# Patient Record
Sex: Female | Born: 1965 | Race: White | Hispanic: No | Marital: Single | Smoking: Never smoker
Health system: Southern US, Community
[De-identification: ages and names within clinical notes are randomized; demographics above are authoritative.]

## PROBLEM LIST (undated history)

## (undated) DIAGNOSIS — J45909 Unspecified asthma, uncomplicated: Secondary | ICD-10-CM

## (undated) DIAGNOSIS — E119 Type 2 diabetes mellitus without complications: Secondary | ICD-10-CM

## (undated) DIAGNOSIS — C641 Malignant neoplasm of right kidney, except renal pelvis: Secondary | ICD-10-CM

## (undated) DIAGNOSIS — J189 Pneumonia, unspecified organism: Secondary | ICD-10-CM

## (undated) DIAGNOSIS — N189 Chronic kidney disease, unspecified: Secondary | ICD-10-CM

## (undated) DIAGNOSIS — I10 Essential (primary) hypertension: Secondary | ICD-10-CM

## (undated) HISTORY — DX: Essential (primary) hypertension: I10

## (undated) HISTORY — PX: SPINAL FUSION: SHX223

## (undated) HISTORY — DX: Unspecified asthma, uncomplicated: J45.909

## (undated) HISTORY — DX: Type 2 diabetes mellitus without complications: E11.9

## (undated) HISTORY — PX: TONSILLECTOMY: SUR1361

## (undated) HISTORY — PX: EYE SURGERY: SHX253

---

## 2013-11-02 LAB — HM COLONOSCOPY

## 2015-01-23 LAB — HM COLONOSCOPY

## 2019-04-25 LAB — HM DEXA SCAN: HM Dexa Scan: -2.5

## 2019-08-16 ENCOUNTER — Ambulatory Visit: Payer: 59 | Admitting: Family

## 2019-08-16 ENCOUNTER — Other Ambulatory Visit: Payer: Self-pay

## 2019-08-16 ENCOUNTER — Other Ambulatory Visit: Payer: Self-pay | Admitting: Family

## 2019-08-16 ENCOUNTER — Encounter: Payer: Self-pay | Admitting: Family

## 2019-08-16 VITALS — BP 126/80 | HR 88 | Temp 98.0°F | Ht 63.0 in | Wt 141.0 lb

## 2019-08-16 DIAGNOSIS — I10 Essential (primary) hypertension: Secondary | ICD-10-CM | POA: Insufficient documentation

## 2019-08-16 DIAGNOSIS — M810 Age-related osteoporosis without current pathological fracture: Secondary | ICD-10-CM

## 2019-08-16 DIAGNOSIS — M4800 Spinal stenosis, site unspecified: Secondary | ICD-10-CM | POA: Insufficient documentation

## 2019-08-16 DIAGNOSIS — Z981 Arthrodesis status: Secondary | ICD-10-CM | POA: Insufficient documentation

## 2019-08-16 DIAGNOSIS — Z1231 Encounter for screening mammogram for malignant neoplasm of breast: Secondary | ICD-10-CM

## 2019-08-16 MED ORDER — LISINOPRIL-HYDROCHLOROTHIAZIDE 20-12.5 MG PO TABS
0.5000 | ORAL_TABLET | Freq: Every day | ORAL | 3 refills | Status: DC
Start: 2019-08-16 — End: 2020-01-30

## 2019-08-16 NOTE — Progress Notes (Signed)
Joann Bond is a 54 y.o. female with the following history as recorded in EpicCare:  Patient Active Problem List   Diagnosis Date Noted   Essential hypertension 08/16/2019   Spinal stenosis 08/16/2019   H/O spinal fusion 08/16/2019    Current Outpatient Medications  Medication Sig Dispense Refill   cholecalciferol (VITAMIN D3) 25 MCG (1000 UNIT) tablet Take 1,000 Units by mouth daily.     gabapentin (NEURONTIN) 300 MG capsule Take 300 mg by mouth 3 (three) times daily.     lisinopril-hydrochlorothiazide (ZESTORETIC) 20-12.5 MG tablet Take 0.5 tablets by mouth daily. 45 tablet 3   montelukast (SINGULAIR) 10 MG tablet Take 10 mg by mouth daily.     oxyCODONE-acetaminophen (PERCOCET/ROXICET) 5-325 MG tablet Take by mouth 3 (three) times daily.     tizanidine (ZANAFLEX) 2 MG capsule Take 2 mg by mouth 3 (three) times daily.     No current facility-administered medications for this visit.    Allergies: Bactrim [sulfamethoxazole-trimethoprim]  No past medical history on file.  Past Surgical History:  Procedure Laterality Date   SPINAL FUSION     Scoliosis/ age 37   SPINAL FUSION     spinal stenosis age 74    Family History  Family history unknown: Yes    Social History   Tobacco Use   Smoking status: Not on file  Substance Use Topics   Alcohol use: Not on file    Subjective:  Presents today as a new patient; had CPE recently with provider in [place]- does not have any records available.  History of hypertension/ spinal fusion; Diagnosed with osteoporosis from provider in [place]- has not started any medication; does not have copy of DEXA today;  Requesting referral to pain management- wants to see Dr. Francesco Runner in [place].  Due for mammogram but getting 2nd COVID shot next week- knows she needs to wait a few months; Will need a colonoscopy in the next year but does not have any transportation at this time.     Objective:  Vitals:   08/16/19 0913  BP:  126/80  Pulse: 88  Temp: 98 F (36.7 C)  TempSrc: Oral  SpO2: 96%  Weight: 141 lb (64 kg)  Height: 5\' 3"  (1.6 m)    General: Well developed, well nourished, in no acute distress  Skin : Warm and dry.  Head: Normocephalic and atraumatic  Lungs: Respirations unlabored; clear to auscultation bilaterally without wheeze, rales, rhonchi  CVS exam: normal rate and regular rhythm.  Musculoskeletal: No deformities; no active joint inflammation  Extremities: No edema, cyanosis, clubbing  Vessels: Symmetric bilaterally  Neurologic: Alert and oriented; speech intact; face symmetrical; moves all extremities well; CNII-XII intact without focal deficit   Assessment:  1. Essential hypertension   2. Spinal stenosis, unspecified spinal region   3. H/O spinal fusion   4. Screening mammogram, encounter for   5. Osteoporosis, unspecified osteoporosis type, unspecified pathological fracture presence     Plan:  1. Refill is updated; she needs to get copies of most recent CPE/ labs for review in order to determine necessary follow-up; she agrees. 2. & 3. Refer to pain management as patient is requesting; explained that we do not do pain management/ prescribe opioids on 1st visit; she will need to get records from her last pain management provider. 4. Referral updated- plan to get in 3 months; 5. Needs to get copy of most recent DEXA to determine treatment plan;  This visit occurred during the SARS-CoV-2 public health emergency.  Safety protocols were in place, including screening questions prior to the visit, additional usage of staff PPE, and extensive cleaning of exam room while observing appropriate contact time as indicated for disinfecting solutions.     No follow-ups on file.  Orders Placed This Encounter  Procedures   MM Digital Screening    Standing Status:   Future    Standing Expiration Date:   08/15/2020    Scheduling Instructions:     Please schedule out 3 months due to second COVID  shot next week    Order Specific Question:   Reason for Exam (SYMPTOM  OR DIAGNOSIS REQUIRED)    Answer:   screening mammogram    Order Specific Question:   Is the patient pregnant?    Answer:   No    Order Specific Question:   Preferred imaging location?    Answer:   [facility]    Requested Prescriptions   Signed Prescriptions Disp Refills   lisinopril-hydrochlorothiazide (ZESTORETIC) 20-12.5 MG tablet 45 tablet 3    Sig: Take 0.5 tablets by mouth daily.

## 2019-08-17 ENCOUNTER — Telehealth: Payer: Self-pay | Admitting: Family

## 2019-08-17 NOTE — Telephone Encounter (Signed)
New message:   Pt is calling and states that the referral placed on today to pain management needs to be faxed to [phone] per the pt. Please advise.

## 2019-09-06 DIAGNOSIS — G8929 Other chronic pain: Secondary | ICD-10-CM | POA: Diagnosis not present

## 2019-09-06 DIAGNOSIS — M7918 Myalgia, other site: Secondary | ICD-10-CM | POA: Diagnosis not present

## 2019-09-06 DIAGNOSIS — M41115 Juvenile idiopathic scoliosis, thoracolumbar region: Secondary | ICD-10-CM | POA: Diagnosis not present

## 2019-09-06 DIAGNOSIS — G894 Chronic pain syndrome: Secondary | ICD-10-CM | POA: Diagnosis not present

## 2019-09-06 DIAGNOSIS — M5136 Other intervertebral disc degeneration, lumbar region: Secondary | ICD-10-CM | POA: Diagnosis not present

## 2019-09-06 DIAGNOSIS — M47814 Spondylosis without myelopathy or radiculopathy, thoracic region: Secondary | ICD-10-CM | POA: Diagnosis not present

## 2019-09-06 DIAGNOSIS — M47816 Spondylosis without myelopathy or radiculopathy, lumbar region: Secondary | ICD-10-CM | POA: Diagnosis not present

## 2019-09-06 DIAGNOSIS — Z79891 Long term (current) use of opiate analgesic: Secondary | ICD-10-CM | POA: Diagnosis not present

## 2019-09-06 MED FILL — tiZANidine HCL 2 MG TABS: 2 | 30 days supply | Qty: 30 | Fill #0

## 2019-09-06 MED FILL — OXYCODONE-APAP 5-325MG: 5-325 | 5 days supply | Qty: 15 | Fill #0

## 2019-09-12 MED FILL — OXYCODONE-APAP 5-325MG: 5-325 | 30 days supply | Qty: 90 | Fill #0

## 2019-09-22 ENCOUNTER — Ambulatory Visit
Admission: EM | Admit: 2019-09-22 | Discharge: 2019-09-22 | Disposition: A | Discharge status: Home / Self Care | Payer: 59 | Attending: Physician Assistant | Admitting: Physician Assistant

## 2019-09-22 ENCOUNTER — Ambulatory Visit (HOSPITAL_COMMUNITY)
Admission: EM | Admit: 2019-09-22 | Discharge: 2019-09-22 | Disposition: A | Discharge status: Home / Self Care | Payer: 59

## 2019-09-22 ENCOUNTER — Other Ambulatory Visit: Payer: Self-pay

## 2019-09-22 DIAGNOSIS — Z5189 Encounter for other specified aftercare: Secondary | ICD-10-CM

## 2019-09-22 DIAGNOSIS — S60561A Insect bite (nonvenomous) of right hand, initial encounter: Secondary | ICD-10-CM

## 2019-09-22 MED ORDER — TRIAMCINOLONE ACETONIDE 0.1 % EX CREA
1.0000 | TOPICAL_CREAM | Freq: Two times a day (BID) | CUTANEOUS | 0 refills | Status: DC
Start: 2019-09-22 — End: 2020-01-24

## 2019-09-22 MED FILL — TRIAMCINOLONE 0.1% CREAM: 0.1 | 10 days supply | Qty: 30 | Fill #0

## 2019-09-22 NOTE — ED Triage Notes (Signed)
Pt states she was bitten by an insect approximately 2 weeks ago and has redness and some drainage. Pt has used neosporin and benadryl cream and it has not gone away. Pt is aox4 and ambulatory.

## 2019-09-22 NOTE — Discharge Instructions (Signed)
Triamcinolone cream as needed for itching. Keep area clean and dry. Monitor for redness, warmth, pain.

## 2019-09-22 NOTE — ED Provider Notes (Signed)
[facility]    CSN: [id number] Arrival date & time: 09/22/19  1238      History   Chief Complaint Chief Complaint  Patient presents with   Insect Bite    Right hand, has been approx 2 weeks    HPI Joann Bond is a 54 y.o. female.   54 year old female comes in for wound check after insect bite 2 weeks ago. States area is itching in sensation. However, now with redness and some drainage. Denies pain, spreading erythema, warmth. Denies fever. Neosporin and benadryl cream without relief.      History reviewed. No pertinent past medical history.  Patient Active Problem List   Diagnosis Date Noted   Essential hypertension 08/16/2019   Spinal stenosis 08/16/2019   H/O spinal fusion 08/16/2019    Past Surgical History:  Procedure Laterality Date   SPINAL FUSION     Scoliosis/ age 89   SPINAL FUSION     spinal stenosis age 56    OB History   No obstetric history on file.      Home Medications    Prior to Admission medications   Medication Sig Start Date End Date Taking? Authorizing Provider  cholecalciferol (VITAMIN D3) 25 MCG (1000 UNIT) tablet Take 1,000 Units by mouth daily.    [provider]  gabapentin (NEURONTIN) 300 MG capsule Take 300 mg by mouth 3 (three) times daily.    [provider]  lisinopril-hydrochlorothiazide (ZESTORETIC) 20-12.5 MG tablet Take 0.5 tablets by mouth daily. 08/16/19   Marrian Salvage, FNP  montelukast (SINGULAIR) 10 MG tablet Take 10 mg by mouth daily.    [provider]  oxyCODONE-acetaminophen (PERCOCET/ROXICET) 5-325 MG tablet Take by mouth 3 (three) times daily.    [provider]  tizanidine (ZANAFLEX) 2 MG capsule Take 2 mg by mouth 3 (three) times daily.    [provider]  triamcinolone cream (KENALOG) 0.1 % Apply 1 application topically 2 (two) times daily. 09/22/19   Ok Edwards, PA-C    Family History Family History  Problem Relation Age of Onset    Cancer Mother    Hypertension Mother     Social History Social History   Tobacco Use   Smoking status: Never Smoker   Smokeless tobacco: Never Used  Scientific laboratory technician Use: Never used  Substance Use Topics   Alcohol use: Not Currently   Drug use: Never     Allergies   Bactrim [sulfamethoxazole-trimethoprim]   Review of Systems Review of Systems  Reason unable to perform ROS: See HPI as above.     Physical Exam Triage Vital Signs ED Triage Vitals  Enc Vitals Group     BP 09/22/19 1317 121/81     Pulse Rate 09/22/19 1317 96     Resp 09/22/19 1317 17     Temp 09/22/19 1317 98.4 F (36.9 C)     Temp Source 09/22/19 1317 Oral     SpO2 09/22/19 1317 98 %     Weight --      Height --      Head Circumference --      Peak Flow --      Pain Score 09/22/19 1318 2     Pain Loc --      Pain Edu? --      Excl. in [place]? --    No data found.  Updated Vital Signs BP 121/81 (BP Location: Right Arm)    Pulse 96  Temp 98.4 F (36.9 C) (Oral)    Resp 17    SpO2 98%   Physical Exam Constitutional:      General: She is not in acute distress.    Appearance: Normal appearance. She is well-developed. She is not toxic-appearing or diaphoretic.  HENT:     Head: Normocephalic and atraumatic.  Eyes:     Conjunctiva/sclera: Conjunctivae normal.     Pupils: Pupils are equal, round, and reactive to light.  Pulmonary:     Effort: Pulmonary effort is normal. No respiratory distress.  Musculoskeletal:     Cervical back: Normal range of motion and neck supple.  Skin:    General: Skin is warm and dry.     Comments: Healing wound to the webspace of 1st and 2nd MCP on right hand. Surrounding erythema with some swelling. No active drainage. No pain, warmth  Neurological:     Mental Status: She is alert and oriented to person, place, and time.      UC Treatments / Results  Labs (all labs ordered are listed, but only abnormal results are displayed) Labs Reviewed - No data  to display  EKG   Radiology No results found.  Procedures Procedures (including critical care time)  Medications Ordered in UC Medications - No data to display  Initial Impression / Assessment and Plan / UC Course  I have reviewed the triage vital signs and the nursing notes.  Pertinent labs & imaging results that were available during my care of the patient were reviewed by me and considered in my medical decision making (see chart for details).     Healing wound without signs of bacterial infection. Will provide symptomatic treatment with triamcinolone. Wound care instructions given. Return precautions given.  Final Clinical Impressions(s) / UC Diagnoses   Final diagnoses:  Visit for wound check  Insect bite of right hand, initial encounter   ED Prescriptions    Medication Sig Dispense Auth. Provider   triamcinolone cream (KENALOG) 0.1 % Apply 1 application topically 2 (two) times daily. 30 g Ok Edwards, PA-C     PDMP not reviewed this encounter.   Ok Edwards, PA-C 09/22/19 1405

## 2019-10-03 DIAGNOSIS — G894 Chronic pain syndrome: Secondary | ICD-10-CM | POA: Diagnosis not present

## 2019-10-03 DIAGNOSIS — M41115 Juvenile idiopathic scoliosis, thoracolumbar region: Secondary | ICD-10-CM | POA: Diagnosis not present

## 2019-10-03 DIAGNOSIS — M47816 Spondylosis without myelopathy or radiculopathy, lumbar region: Secondary | ICD-10-CM | POA: Diagnosis not present

## 2019-10-03 DIAGNOSIS — Z79891 Long term (current) use of opiate analgesic: Secondary | ICD-10-CM | POA: Diagnosis not present

## 2019-10-03 DIAGNOSIS — M5136 Other intervertebral disc degeneration, lumbar region: Secondary | ICD-10-CM | POA: Diagnosis not present

## 2019-10-03 DIAGNOSIS — H5213 Myopia, bilateral: Secondary | ICD-10-CM | POA: Diagnosis not present

## 2019-10-03 DIAGNOSIS — M47814 Spondylosis without myelopathy or radiculopathy, thoracic region: Secondary | ICD-10-CM | POA: Diagnosis not present

## 2019-10-03 MED FILL — GABAPENTIN 300 MG CAPSULE: 300 | 90 days supply | Qty: 270 | Fill #0

## 2019-10-03 MED FILL — TIZANIDINE HCL 2 MG CAPS: 2 | 90 days supply | Qty: 270 | Fill #0

## 2019-10-05 MED FILL — MONTELUKAST SOD 10 MG TAB: 10 | 30 days supply | Qty: 30 | Fill #0

## 2019-10-09 ENCOUNTER — Encounter: Payer: Self-pay | Admitting: Nurse Practitioner

## 2019-10-09 ENCOUNTER — Ambulatory Visit (INDEPENDENT_AMBULATORY_CARE_PROVIDER_SITE_OTHER): Payer: 59 | Admitting: Nurse Practitioner

## 2019-10-09 ENCOUNTER — Other Ambulatory Visit: Payer: Self-pay

## 2019-10-09 VITALS — BP 122/82 | Ht 63.0 in | Wt 144.0 lb

## 2019-10-09 DIAGNOSIS — Z01419 Encounter for gynecological examination (general) (routine) without abnormal findings: Secondary | ICD-10-CM

## 2019-10-09 DIAGNOSIS — M810 Age-related osteoporosis without current pathological fracture: Secondary | ICD-10-CM

## 2019-10-09 DIAGNOSIS — R87810 Cervical high risk human papillomavirus (HPV) DNA test positive: Secondary | ICD-10-CM

## 2019-10-09 NOTE — Patient Instructions (Signed)

## 2019-10-09 NOTE — Progress Notes (Signed)
   Joann Bond [birth year] [id number]   History:  54 y.o. G0 presents as new patient to establish care. Moved here from [facility]. Recently established with a PCP. We do not have any records. Reports history of cervical polyp, positive HPV 2-3 years ago, osteoporosis, not on treatment. She plans to have bone density sent here and/or to PCP. Has had multiple back surgeries. Mother diagnosed with breast cancer at age 80, BRCA negative. Normal mammogram history. Not currently sexually active.   Gynecologic History No LMP recorded. Patient is postmenopausal.   Last Pap: 2020. Results were: Normal per patient Last mammogram: 2020. Results were: Normal. Mammogram scheduled for 11/24/2019 Last colonoscopy: 5 years ago. Due for repeat but does not have transportation Last Dexa: 06/2019. Results were: osteoporosis  Past medical history, past surgical history, family history and social history were all reviewed and documented in the EPIC chart.  ROS:  A ROS was performed and pertinent positives and negatives are included.  Exam:  Vitals:   10/09/19 1129  BP: 122/82  Weight: 144 lb (65.3 kg)  Height: _0  (1.6 m)   Body mass index is 25.51 kg/m.  General appearance:  Normal Thyroid:  Symmetrical, normal in size, without palpable masses or nodularity. Respiratory  Auscultation:  Clear without wheezing or rhonchi Cardiovascular  Auscultation:  Regular rate, without rubs, murmurs or gallops  Edema/varicosities:  Not grossly evident Abdominal  Soft,nontender, without masses, guarding or rebound.  Liver/spleen:  No organomegaly noted  Hernia:  None appreciated  Skin  Inspection:  Grossly normal   Breasts: Examined lying and sitting.   Right: Without masses, retractions, discharge or axillary adenopathy.   Left: Without masses, retractions, discharge or axillary adenopathy. Gentitourinary   Inguinal/mons:  Normal without inguinal adenopathy  External genitalia:  Normal  BUS/Urethra/Skene's  glands:  Normal  Vagina:  Normal  Cervix:  Normal  Uterus:  Anteverted, normal in size, shape and contour.  Midline and mobile  Adnexa/parametria:     Rt: Without masses or tenderness.   Lt: Without masses or tenderness.  Anus and perineum: Normal   Assessment/Plan:  54 y.o. G0 for annual exam.   Well female exam with routine gynecological exam - Education provided on SBEs, importance of preventative screenings, current guidelines, high calcium diet, regular exercise, and multivitamin daily. Labs elsewhere.  Cervical high risk HPV (human papillomavirus) test positive - 2-3 years ago per patient, otherwise normal pap history. Pap today.   Age-related osteoporosis without current pathological fracture - we do not have records. Most recent bone density June 2021. She plans to have these records sent. Taking daily vitamin D supplement.  Screening for breast cancer -normal breast exam today.  Mother diagnosed with breast cancer at age 18, BRCA negative.  Schedule for screening mammogram 11/24/2019.  Screening for colon cancer -most recent colonoscopy 5 years ago.  She is due for another but does not have transportation at this time.  Recommend Cologuard until then.  Follow-up in 1 year for annual.     Tamela Gammon [facility], 11:54 AM 10/09/2019

## 2019-10-09 NOTE — Addendum Note (Signed)
Addended by: Lorine Bears on: 10/09/2019 12:28 PM   Modules accepted: Orders

## 2019-10-10 ENCOUNTER — Encounter: Payer: Self-pay | Admitting: Physician Assistant

## 2019-10-11 LAB — PAP IG W/ RFLX HPV ASCU

## 2019-10-12 MED FILL — OXYCODONE-APAP 5-325MG: 5-325 | 30 days supply | Qty: 90 | Fill #0

## 2019-10-25 ENCOUNTER — Encounter: Payer: Self-pay | Admitting: Nurse Practitioner

## 2019-10-25 ENCOUNTER — Other Ambulatory Visit: Payer: Self-pay

## 2019-10-25 ENCOUNTER — Ambulatory Visit: Payer: 59 | Admitting: Nurse Practitioner

## 2019-10-25 VITALS — BP 124/80

## 2019-10-25 DIAGNOSIS — R87615 Unsatisfactory cytologic smear of cervix: Secondary | ICD-10-CM

## 2019-10-25 LAB — HM PAP SMEAR

## 2019-10-25 NOTE — Addendum Note (Signed)
Addended by: Lorine Bears on: 10/25/2019 12:36 PM   Modules accepted: Orders

## 2019-10-25 NOTE — Progress Notes (Signed)
History: 54 year old G0 for repeat Pap due to insufficient cells collected 10/09/2019.  Reports positive HPV 2-3 years ago, otherwise normal pap history.   Exam: Appears well External genitalia normal, Vagina normal, cervix normal but almost flush with vaginal wall.  Assessment:  Encounter for repeat Pap smear due to previous insufficient cervical cells  Plan: Will reach out to patient with results. She is agreeable to plan.

## 2019-10-26 LAB — PAP IG W/ RFLX HPV ASCU

## 2019-11-09 ENCOUNTER — Other Ambulatory Visit (HOSPITAL_COMMUNITY): Payer: Self-pay

## 2019-11-10 ENCOUNTER — Other Ambulatory Visit: Payer: Self-pay | Admitting: Family Medicine

## 2019-11-10 ENCOUNTER — Ambulatory Visit: Payer: Self-pay

## 2019-11-10 ENCOUNTER — Other Ambulatory Visit: Payer: Self-pay

## 2019-11-10 DIAGNOSIS — M25511 Pain in right shoulder: Secondary | ICD-10-CM

## 2019-11-13 MED FILL — OXYCODONE-APAP 5-325MG: 5-325 | 30 days supply | Qty: 90 | Fill #0

## 2019-11-13 MED FILL — LISINOPRIL-HCTZ 20-12.5 MG: 20-12.5 | 90 days supply | Qty: 45 | Fill #0

## 2019-11-13 MED FILL — PREVIDENT 0.2% RINSE: 0.2 | 48 days supply | Qty: 473 | Fill #0

## 2019-11-16 ENCOUNTER — Other Ambulatory Visit (HOSPITAL_COMMUNITY): Payer: Self-pay | Admitting: Anesthesiology

## 2019-11-24 ENCOUNTER — Ambulatory Visit
Admission: RE | Admit: 2019-11-24 | Discharge: 2019-11-24 | Disposition: A | Discharge status: Home / Self Care | Payer: 59 | Source: Ambulatory Visit | Attending: Family | Admitting: Family

## 2019-11-24 ENCOUNTER — Other Ambulatory Visit: Payer: Self-pay

## 2019-11-24 DIAGNOSIS — Z1231 Encounter for screening mammogram for malignant neoplasm of breast: Secondary | ICD-10-CM

## 2019-11-24 LAB — HM MAMMOGRAPHY

## 2019-12-13 ENCOUNTER — Other Ambulatory Visit (HOSPITAL_COMMUNITY): Payer: Self-pay | Admitting: Internal Medicine

## 2019-12-13 MED FILL — OXYCODONE-APAP 5-325MG: 5-325 | 30 days supply | Qty: 90 | Fill #0

## 2019-12-13 MED FILL — SHINGRIX 50 MCG SUS: 50 | 1 days supply | Qty: 1 | Fill #0

## 2019-12-27 ENCOUNTER — Other Ambulatory Visit (HOSPITAL_COMMUNITY): Payer: Self-pay | Admitting: Pain Medicine

## 2019-12-27 DIAGNOSIS — M47814 Spondylosis without myelopathy or radiculopathy, thoracic region: Secondary | ICD-10-CM | POA: Diagnosis not present

## 2019-12-27 DIAGNOSIS — G894 Chronic pain syndrome: Secondary | ICD-10-CM | POA: Diagnosis not present

## 2019-12-27 DIAGNOSIS — Z981 Arthrodesis status: Secondary | ICD-10-CM | POA: Diagnosis not present

## 2019-12-27 DIAGNOSIS — M50321 Other cervical disc degeneration at C4-C5 level: Secondary | ICD-10-CM | POA: Diagnosis not present

## 2019-12-27 DIAGNOSIS — M519 Unspecified thoracic, thoracolumbar and lumbosacral intervertebral disc disorder: Secondary | ICD-10-CM | POA: Diagnosis not present

## 2019-12-27 DIAGNOSIS — M419 Scoliosis, unspecified: Secondary | ICD-10-CM | POA: Diagnosis not present

## 2019-12-27 DIAGNOSIS — M5136 Other intervertebral disc degeneration, lumbar region: Secondary | ICD-10-CM | POA: Diagnosis not present

## 2019-12-27 DIAGNOSIS — M47812 Spondylosis without myelopathy or radiculopathy, cervical region: Secondary | ICD-10-CM | POA: Diagnosis not present

## 2019-12-27 DIAGNOSIS — Z79891 Long term (current) use of opiate analgesic: Secondary | ICD-10-CM | POA: Diagnosis not present

## 2020-01-15 ENCOUNTER — Ambulatory Visit: Payer: 59 | Admitting: Family

## 2020-01-15 MED FILL — OXYCODONE-APAP 5-325MG: 5-325 | 30 days supply | Qty: 90 | Fill #0

## 2020-01-17 ENCOUNTER — Ambulatory Visit: Payer: 59 | Admitting: Family

## 2020-01-24 ENCOUNTER — Encounter: Payer: Self-pay | Admitting: Internal Medicine

## 2020-01-24 ENCOUNTER — Other Ambulatory Visit: Payer: Self-pay

## 2020-01-24 ENCOUNTER — Ambulatory Visit: Payer: 59 | Admitting: Family

## 2020-01-24 ENCOUNTER — Telehealth: Payer: Self-pay | Admitting: *Deleted

## 2020-01-24 ENCOUNTER — Ambulatory Visit: Payer: 59 | Admitting: Internal Medicine

## 2020-01-24 VITALS — BP 136/86 | HR 94 | Temp 98.4°F | Resp 16 | Ht 63.0 in | Wt 142.0 lb

## 2020-01-24 DIAGNOSIS — Z0000 Encounter for general adult medical examination without abnormal findings: Secondary | ICD-10-CM | POA: Diagnosis not present

## 2020-01-24 DIAGNOSIS — Z0001 Encounter for general adult medical examination with abnormal findings: Secondary | ICD-10-CM | POA: Diagnosis not present

## 2020-01-24 DIAGNOSIS — T50905A Adverse effect of unspecified drugs, medicaments and biological substances, initial encounter: Secondary | ICD-10-CM

## 2020-01-24 DIAGNOSIS — I10 Essential (primary) hypertension: Secondary | ICD-10-CM

## 2020-01-24 DIAGNOSIS — D539 Nutritional anemia, unspecified: Secondary | ICD-10-CM | POA: Diagnosis not present

## 2020-01-24 DIAGNOSIS — M8000XD Age-related osteoporosis with current pathological fracture, unspecified site, subsequent encounter for fracture with routine healing: Secondary | ICD-10-CM | POA: Diagnosis not present

## 2020-01-24 DIAGNOSIS — N1831 Chronic kidney disease, stage 3a: Secondary | ICD-10-CM

## 2020-01-24 DIAGNOSIS — M8000XA Age-related osteoporosis with current pathological fracture, unspecified site, initial encounter for fracture: Secondary | ICD-10-CM | POA: Insufficient documentation

## 2020-01-24 LAB — URINALYSIS, ROUTINE W REFLEX MICROSCOPIC
Bilirubin Urine: NEGATIVE
Hgb urine dipstick: NEGATIVE
Ketones, ur: NEGATIVE
Leukocytes,Ua: NEGATIVE
Nitrite: NEGATIVE
RBC / HPF: NONE SEEN (ref 0–?)
Specific Gravity, Urine: 1.025 (ref 1.000–1.030)
Total Protein, Urine: NEGATIVE
Urine Glucose: NEGATIVE
Urobilinogen, UA: 0.2 (ref 0.0–1.0)
pH: 5 (ref 5.0–8.0)

## 2020-01-24 LAB — CBC WITH DIFFERENTIAL/PLATELET
Basophils Absolute: 0 10*3/uL (ref 0.0–0.1)
Basophils Relative: 0.5 % (ref 0.0–3.0)
Eosinophils Absolute: 0.2 10*3/uL (ref 0.0–0.7)
Eosinophils Relative: 2.5 % (ref 0.0–5.0)
HCT: 39.3 % (ref 36.0–46.0)
Hemoglobin: 13 g/dL (ref 12.0–15.0)
Lymphocytes Relative: 27.2 % (ref 12.0–46.0)
Lymphs Abs: 2.1 10*3/uL (ref 0.7–4.0)
MCHC: 33 g/dL (ref 30.0–36.0)
MCV: 87.9 fl (ref 78.0–100.0)
Monocytes Absolute: 0.8 10*3/uL (ref 0.1–1.0)
Monocytes Relative: 9.9 % (ref 3.0–12.0)
Neutro Abs: 4.7 10*3/uL (ref 1.4–7.7)
Neutrophils Relative %: 59.9 % (ref 43.0–77.0)
Platelets: 335 10*3/uL (ref 150.0–400.0)
RBC: 4.48 Mil/uL (ref 3.87–5.11)
RDW: 14.9 % (ref 11.5–15.5)
WBC: 7.8 10*3/uL (ref 4.0–10.5)

## 2020-01-24 LAB — BASIC METABOLIC PANEL
BUN: 47 mg/dL — ABNORMAL HIGH (ref 6–23)
CO2: 31 mEq/L (ref 19–32)
Calcium: 10.8 mg/dL — ABNORMAL HIGH (ref 8.4–10.5)
Chloride: 98 mEq/L (ref 96–112)
Creatinine, Ser: 1.15 mg/dL (ref 0.40–1.20)
GFR: 53.83 mL/min — ABNORMAL LOW (ref >60.00)
Glucose, Bld: 104 mg/dL — ABNORMAL HIGH (ref 70–99)
Potassium: 4.4 mEq/L (ref 3.5–5.1)
Sodium: 136 mEq/L (ref 135–145)

## 2020-01-24 LAB — HEPATIC FUNCTION PANEL
ALT: 18 U/L (ref 0–35)
AST: 17 U/L (ref 0–37)
Albumin: 5.2 g/dL (ref 3.5–5.2)
Alkaline Phosphatase: 76 U/L (ref 39–117)
Bilirubin, Direct: 0.1 mg/dL (ref 0.0–0.3)
Total Bilirubin: 0.6 mg/dL (ref 0.2–1.2)
Total Protein: 7.6 g/dL (ref 6.0–8.3)

## 2020-01-24 LAB — LIPID PANEL
Cholesterol: 223 mg/dL — ABNORMAL HIGH (ref 0–200)
HDL: 55.7 mg/dL (ref >39.00)
NonHDL: 167.01
Total CHOL/HDL Ratio: 4
Triglycerides: 279 mg/dL — ABNORMAL HIGH (ref 0.0–149.0)
VLDL: 55.8 mg/dL — ABNORMAL HIGH (ref 0.0–40.0)

## 2020-01-24 LAB — VITAMIN B12: Vitamin B-12: 292 pg/mL (ref 211–911)

## 2020-01-24 LAB — FERRITIN: Ferritin: 26.2 ng/mL (ref 10.0–291.0)

## 2020-01-24 LAB — VITAMIN D 25 HYDROXY (VIT D DEFICIENCY, FRACTURES): VITD: >120 ng/mL

## 2020-01-24 LAB — LDL CHOLESTEROL, DIRECT: Direct LDL: 137 mg/dL

## 2020-01-24 LAB — FOLATE: Folate: 12.9 ng/mL (ref >5.9)

## 2020-01-24 LAB — TSH: TSH: 1.02 u[IU]/mL (ref 0.35–4.50)

## 2020-01-24 NOTE — Telephone Encounter (Signed)
Ask her to stop all Vit D supplements and to let me know if she has any GI symptoms related to this

## 2020-01-24 NOTE — Telephone Encounter (Signed)
Pt informed of below.  

## 2020-01-24 NOTE — Progress Notes (Unsigned)
Subjective:  Patient ID: Joann Bond, female    DOB: 1965-09-18  Age: 55 y.o. MRN: [id number]  CC: Anemia, Hypertension, and Annual Exam  This visit occurred during the SARS-CoV-2 public health emergency.  Safety protocols were in place, including screening questions prior to the visit, additional usage of staff PPE, and extensive cleaning of exam room while observing appropriate contact time as indicated for disinfecting solutions.   NEW TO ME  HPI Joann Bond presents for a CPX.  She has a history of osteoporosis and wants to consider treatment options.  She had a DEXA scan done elsewhere almost a year ago that showed T score of -2.5.  She tells me she recently broke her finger. She has had multiple lumbar surgeries for the treatment of spinal stenosis.  She is treated with oxycodone, acetaminophen, and tizanidine.  She also has a history of hypertension.  She is currently taking an ACE inhibitor and thiazide diuretic.  She is active and denies any recent episodes of chest pain, shortness of breath, palpitations, edema, or fatigue. She has a hx of anemia.   Outpatient Medications Prior to Visit  Medication Sig Dispense Refill  . gabapentin (NEURONTIN) 300 MG capsule Take 300 mg by mouth 3 (three) times daily.    Marland Kitchen oxyCODONE-acetaminophen (PERCOCET/ROXICET) 5-325 MG tablet Take by mouth 3 (three) times daily.    . tizanidine (ZANAFLEX) 2 MG capsule Take 2 mg by mouth 3 (three) times daily.    Marland Kitchen lisinopril-hydrochlorothiazide (ZESTORETIC) 20-12.5 MG tablet Take 0.5 tablets by mouth daily. 45 tablet 3  . cholecalciferol (VITAMIN D3) 25 MCG (1000 UNIT) tablet Take 1,000 Units by mouth daily.    . montelukast (SINGULAIR) 10 MG tablet Take 10 mg by mouth daily.    Marland Kitchen triamcinolone cream (KENALOG) 0.1 % Apply 1 application topically 2 (two) times daily. 30 g 0   No facility-administered medications prior to visit.    ROS Review of Systems  Constitutional: Negative.  Negative for appetite  change, diaphoresis, fatigue and unexpected weight change.  HENT: Negative.   Eyes: Negative for visual disturbance.  Respiratory: Negative for cough, chest tightness, shortness of breath and wheezing.   Cardiovascular: Negative for chest pain, palpitations and leg swelling.  Gastrointestinal: Negative for abdominal pain, constipation, diarrhea, nausea and vomiting.  Endocrine: Negative.   Genitourinary: Negative.  Negative for decreased urine volume, difficulty urinating, dysuria, hematuria and urgency.  Musculoskeletal: Positive for back pain and gait problem. Negative for arthralgias and myalgias.  Skin: Negative.  Negative for color change and pallor.  Neurological: Negative for dizziness, weakness, light-headedness, numbness and headaches.  Hematological: Negative for adenopathy. Does not bruise/bleed easily.  Psychiatric/Behavioral: Negative.     Objective:  BP 136/86   Pulse 94   Temp 98.4 F (36.9 C) (Oral)   Resp 16   Ht 5\' 3"  (1.6 m)   Wt 142 lb (64.4 kg)   SpO2 95%   BMI 25.15 kg/m   BP Readings from Last 3 Encounters:  01/24/20 136/86  10/25/19 124/80  10/09/19 122/82    Wt Readings from Last 3 Encounters:  01/24/20 142 lb (64.4 kg)  10/09/19 144 lb (65.3 kg)  08/16/19 141 lb (64 kg)    Physical Exam Vitals reviewed.  Constitutional:      Appearance: Normal appearance.  HENT:     Nose: Nose normal.     Mouth/Throat:     Mouth: Mucous membranes are moist.  Eyes:     General: No scleral icterus.  Conjunctiva/sclera: Conjunctivae normal.  Cardiovascular:     Rate and Rhythm: Normal rate and regular rhythm.     Heart sounds: No murmur heard.   Pulmonary:     Effort: Pulmonary effort is normal.     Breath sounds: No stridor. No wheezing, rhonchi or rales.  Abdominal:     General: Abdomen is flat. Bowel sounds are normal. There is no distension.     Palpations: Abdomen is soft. There is no hepatomegaly, splenomegaly or mass.     Tenderness: There  is no abdominal tenderness.  Musculoskeletal:        General: Normal range of motion.     Cervical back: Neck supple.     Right lower leg: No edema.     Left lower leg: No edema.  Lymphadenopathy:     Cervical: No cervical adenopathy.  Skin:    General: Skin is warm and dry.     Coloration: Skin is not pale.  Neurological:     General: No focal deficit present.     Mental Status: She is alert and oriented to person, place, and time.  Psychiatric:        Mood and Affect: Mood normal.        Behavior: Behavior normal.     Lab Results  Component Value Date   WBC 7.8 01/24/2020   HGB 13.0 01/24/2020   HCT 39.3 01/24/2020   PLT 335.0 01/24/2020   GLUCOSE 104 (H) 01/24/2020   CHOL 223 (H) 01/24/2020   TRIG 279.0 (H) 01/24/2020   HDL 55.70 01/24/2020   LDLDIRECT 137.0 01/24/2020   ALT 18 01/24/2020   AST 17 01/24/2020   NA 136 01/24/2020   K 4.4 01/24/2020   CL 98 01/24/2020   CREATININE 1.15 01/24/2020   BUN 47 (H) 01/24/2020   CO2 31 01/24/2020   TSH 1.02 01/24/2020    MM Digital Screening  Result Date: 11/28/2019 CLINICAL DATA:  Screening. EXAM: DIGITAL SCREENING BILATERAL MAMMOGRAM WITH CAD COMPARISON:  Previous exam(s). ACR Breast Density Category c: The breast tissue is heterogeneously dense, which may obscure small masses. FINDINGS: Exam is somewhat limited by difficulty with patient positioning. There are no findings suspicious for malignancy. Images were processed with CAD. IMPRESSION: No mammographic evidence of malignancy. A result letter of this screening mammogram will be mailed directly to the patient. RECOMMENDATION: Screening mammogram in one year. (Code:SM-B-01Y) BI-RADS CATEGORY  1: Negative. Electronically Signed   By: Joann Bond M.D.   On: 11/28/2019 12:49    Assessment & Plan:   Joann Bond was seen today for anemia, hypertension and annual exam.  Diagnoses and all orders for this visit:  Age-related osteoporosis with current pathological fracture  with routine healing, subsequent encounter- I recommend that she treat this with an anabolic agent, will start evenity. -     Basic metabolic panel; Future -     VITAMIN D 25 Hydroxy (Vit-D Deficiency, Fractures); Future -     VITAMIN D 25 Hydroxy (Vit-D Deficiency, Fractures) -     Basic metabolic panel  Primary hypertension- Her blood pressure is well controlled but she has developed a prerenal azotemia with a decline in her renal function and hypercalcemia.  I recommended that she stop taking the ACE inhibitor and thiazide diuretic.  Will try to control her blood pressure with an ARB. -     CBC with Differential/Platelet; Future -     Basic metabolic panel; Future -     TSH; Future -  Urinalysis, Routine w reflex microscopic; Future -     Hepatic function panel; Future -     Hepatic function panel -     Urinalysis, Routine w reflex microscopic -     TSH -     Basic metabolic panel -     CBC with Differential/Platelet -     valsartan (DIOVAN) 80 MG tablet; Take 1 tablet (80 mg total) by mouth 2 (two) times daily.  Deficiency anemia- Her H&H are normal now.  I will screen her for vitamin deficiencies. -     CBC with Differential/Platelet; Future -     Vitamin B12; Future -     Vitamin B1; Future -     Folate; Future -     Ferritin; Future -     Reticulocytes; Future -     Reticulocytes -     Ferritin -     Folate -     Vitamin B1 -     Vitamin B12 -     CBC with Differential/Platelet  Encounter for general adult medical examination with abnormal findings- Exam completed, labs reviewed - Statin therapy is not indicated, vaccines reviewed and updated, cancer screenings are up-to-date, patient education material was given. -     Lipid panel; Future -     Hepatitis C antibody; Future -     HIV Antibody (routine testing w rflx); Future -     HIV Antibody (routine testing w rflx) -     Hepatitis C antibody -     Lipid panel  Essential hypertension  Stage 3a chronic kidney  disease ([place])- Will discontinue the ACE inhibitor and thiazide diuretic.  I recommended an ultrasound to see if there is asymmetry or evidence of obstruction. 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  Will also hold the thiazide diuretic for now.  Other orders -     LDL cholesterol, direct   I have discontinued Laquanda Mangieri's montelukast, cholecalciferol, lisinopril-hydrochlorothiazide, and triamcinolone. I am also having her start on valsartan. Additionally, I am having her maintain her oxyCODONE-acetaminophen, gabapentin, and tizanidine.  Meds ordered this encounter  Medications  . valsartan (DIOVAN) 80 MG tablet    Sig: Take 1 tablet (80 mg total) by mouth 2 (two) times daily.    Dispense:  180 tablet    Refill:  1     Follow-up: Return in about 6 months (around 07/23/2020).  Scarlette Calico, MD

## 2020-01-24 NOTE — Patient Instructions (Signed)
Health Maintenance, Female Adopting a healthy lifestyle and getting preventive care are important in promoting health and wellness. Ask your health care provider about:  The right schedule for you to have regular tests and exams.  Things you can do on your own to prevent diseases and keep yourself healthy. What should I know about diet, weight, and exercise? Eat a healthy diet  Eat a diet that includes plenty of vegetables, fruits, low-fat dairy products, and lean protein.  Do not eat a lot of foods that are high in solid fats, added sugars, or sodium.   Maintain a healthy weight Body mass index (BMI) is used to identify weight problems. It estimates body fat based on height and weight. Your health care provider can help determine your BMI and help you achieve or maintain a healthy weight. Get regular exercise Get regular exercise. This is one of the most important things you can do for your health. Most adults should:  Exercise for at least 150 minutes each week. The exercise should increase your heart rate and make you sweat (moderate-intensity exercise).  Do strengthening exercises at least twice a week. This is in addition to the moderate-intensity exercise.  Spend less time sitting. Even light physical activity can be beneficial. Watch cholesterol and blood lipids Have your blood tested for lipids and cholesterol at 55 years of age, then have this test every 5 years. Have your cholesterol levels checked more often if:  Your lipid or cholesterol levels are high.  You are older than 55 years of age.  You are at high risk for heart disease. What should I know about cancer screening? Depending on your health history and family history, you may need to have cancer screening at various ages. This may include screening for:  Breast cancer.  Cervical cancer.  Colorectal cancer.  Skin cancer.  Lung cancer. What should I know about heart disease, diabetes, and high blood  pressure? Blood pressure and heart disease  High blood pressure causes heart disease and increases the risk of stroke. This is more likely to develop in people who have high blood pressure readings, are of African descent, or are overweight.  Have your blood pressure checked: ? Every 3-5 years if you are 18-39 years of age. ? Every year if you are 40 years old or older. Diabetes Have regular diabetes screenings. This checks your fasting blood sugar level. Have the screening done:  Once every three years after age 40 if you are at a normal weight and have a low risk for diabetes.  More often and at a younger age if you are overweight or have a high risk for diabetes. What should I know about preventing infection? Hepatitis B If you have a higher risk for hepatitis B, you should be screened for this virus. Talk with your health care provider to find out if you are at risk for hepatitis B infection. Hepatitis C Testing is recommended for:  Everyone born from 1945 through 1965.  Anyone with known risk factors for hepatitis C. Sexually transmitted infections (STIs)  Get screened for STIs, including gonorrhea and chlamydia, if: ? You are sexually active and are younger than 55 years of age. ? You are older than 55 years of age and your health care provider tells you that you are at risk for this type of infection. ? Your sexual activity has changed since you were last screened, and you are at increased risk for chlamydia or gonorrhea. Ask your health care provider   if you are at risk.  Ask your health care provider about whether you are at high risk for HIV. Your health care provider may recommend a prescription medicine to help prevent HIV infection. If you choose to take medicine to prevent HIV, you should first get tested for HIV. You should then be tested every 3 months for as long as you are taking the medicine. Pregnancy  If you are about to stop having your period (premenopausal) and  you may become pregnant, seek counseling before you get pregnant.  Take 400 to 800 micrograms (mcg) of folic acid every day if you become pregnant.  Ask for birth control (contraception) if you want to prevent pregnancy. Osteoporosis and menopause Osteoporosis is a disease in which the bones lose minerals and strength with aging. This can result in bone fractures. If you are 65 years old or older, or if you are at risk for osteoporosis and fractures, ask your health care provider if you should:  Be screened for bone loss.  Take a calcium or vitamin D supplement to lower your risk of fractures.  Be given hormone replacement therapy (HRT) to treat symptoms of menopause. Follow these instructions at home: Lifestyle  Do not use any products that contain nicotine or tobacco, such as cigarettes, e-cigarettes, and chewing tobacco. If you need help quitting, ask your health care provider.  Do not use street drugs.  Do not share needles.  Ask your health care provider for help if you need support or information about quitting drugs. Alcohol use  Do not drink alcohol if: ? Your health care provider tells you not to drink. ? You are pregnant, may be pregnant, or are planning to become pregnant.  If you drink alcohol: ? Limit how much you use to 0-1 drink a day. ? Limit intake if you are breastfeeding.  Be aware of how much alcohol is in your drink. In the [place] one drink equals one 12 oz bottle of beer (355 mL), one 5 oz glass of wine (148 mL), or one 1 oz glass of hard liquor (44 mL). General instructions  Schedule regular health, dental, and eye exams.  Stay current with your vaccines.  Tell your health care provider if: ? You often feel depressed. ? You have ever been abused or do not feel safe at home. Summary  Adopting a healthy lifestyle and getting preventive care are important in promoting health and wellness.  Follow your health care provider's instructions about healthy  diet, exercising, and getting tested or screened for diseases.  Follow your health care provider's instructions on monitoring your cholesterol and blood pressure. This information is not intended to replace advice given to you by your health care provider. Make sure you discuss any questions you have with your health care provider. Document Revised: 12/15/2017 Document Reviewed: 12/15/2017 Elsevier Patient Education  2021 Elsevier Inc.  

## 2020-01-24 NOTE — Telephone Encounter (Signed)
Per [facility] lab- Patient's Vit D is elevated at > 120.

## 2020-01-27 LAB — HEPATITIS C ANTIBODY
Hepatitis C Ab: NONREACTIVE
SIGNAL TO CUT-OFF: 0.01 (ref <1.00)

## 2020-01-27 LAB — VITAMIN B1: Vitamin B1 (Thiamine): 25 nmol/L (ref 8–30)

## 2020-01-27 LAB — RETICULOCYTES
ABS Retic: 57330 cells/uL (ref 20000–8000)
Retic Ct Pct: 1.3 %

## 2020-01-27 LAB — HIV ANTIBODY (ROUTINE TESTING W REFLEX): HIV 1&2 Ab, 4th Generation: NONREACTIVE

## 2020-01-29 DIAGNOSIS — T50905A Adverse effect of unspecified drugs, medicaments and biological substances, initial encounter: Secondary | ICD-10-CM | POA: Insufficient documentation

## 2020-01-30 ENCOUNTER — Other Ambulatory Visit: Payer: Self-pay | Admitting: Internal Medicine

## 2020-01-30 MED ORDER — VALSARTAN 80 MG PO TABS: 80.0000 mg | ORAL_TABLET | Freq: Two times a day (BID) | ORAL | 1 refills | Status: DC

## 2020-01-30 MED FILL — VALSARTAN 80 MG TABLET: 80 | 90 days supply | Qty: 180 | Fill #0

## 2020-01-31 ENCOUNTER — Telehealth: Payer: Self-pay | Admitting: Internal Medicine

## 2020-01-31 DIAGNOSIS — M810 Age-related osteoporosis without current pathological fracture: Secondary | ICD-10-CM

## 2020-01-31 NOTE — Telephone Encounter (Signed)
1.26.22 paperwork submitted to [employer] for new evenity start.

## 2020-01-31 NOTE — Telephone Encounter (Signed)
-----   Message from Janith Lima, MD sent at 01/27/2020  3:56 PM EST ----- Regarding: Evenity CORRECTION - please set her up for monthly evenity injections  TJ

## 2020-02-01 ENCOUNTER — Ambulatory Visit
Admission: RE | Admit: 2020-02-01 | Discharge: 2020-02-01 | Disposition: A | Discharge status: Home / Self Care | Payer: 59 | Source: Ambulatory Visit | Attending: Internal Medicine | Admitting: Internal Medicine

## 2020-02-01 DIAGNOSIS — N189 Chronic kidney disease, unspecified: Secondary | ICD-10-CM | POA: Diagnosis not present

## 2020-02-01 DIAGNOSIS — N1831 Chronic kidney disease, stage 3a: Secondary | ICD-10-CM

## 2020-02-13 ENCOUNTER — Other Ambulatory Visit: Payer: Self-pay | Admitting: Internal Medicine

## 2020-02-13 DIAGNOSIS — M8000XD Age-related osteoporosis with current pathological fracture, unspecified site, subsequent encounter for fracture with routine healing: Secondary | ICD-10-CM

## 2020-02-13 MED ORDER — EVENITY 105 MG/1.17ML ~~LOC~~ SOSY
210.0000 mg | PREFILLED_SYRINGE | SUBCUTANEOUS | 11 refills | Status: DC
Start: 2020-02-13 — End: 2020-02-19

## 2020-02-13 NOTE — Telephone Encounter (Signed)
Please send an escript for Evenity to the [facility].

## 2020-02-13 NOTE — Telephone Encounter (Signed)
PA Submitted through Cover my meds  2.8.22 Key: [id number] PA Case ID: [id number]

## 2020-02-15 MED FILL — OXYCODONE-APAP 5-325MG: 5-325 | 30 days supply | Qty: 90 | Fill #0

## 2020-02-19 ENCOUNTER — Other Ambulatory Visit: Payer: Self-pay

## 2020-02-19 ENCOUNTER — Ambulatory Visit (HOSPITAL_BASED_OUTPATIENT_CLINIC_OR_DEPARTMENT_OTHER): Payer: 59 | Admitting: Pharmacist

## 2020-02-19 ENCOUNTER — Other Ambulatory Visit: Payer: Self-pay | Admitting: Internal Medicine

## 2020-02-19 DIAGNOSIS — M8000XD Age-related osteoporosis with current pathological fracture, unspecified site, subsequent encounter for fracture with routine healing: Secondary | ICD-10-CM

## 2020-02-19 DIAGNOSIS — Z79899 Other long term (current) drug therapy: Secondary | ICD-10-CM

## 2020-02-19 MED ORDER — EVENITY 105 MG/1.17ML ~~LOC~~ SOSY: 210.0000 mg | PREFILLED_SYRINGE | SUBCUTANEOUS | 11 refills | Status: DC

## 2020-02-19 NOTE — Progress Notes (Signed)
   S: Patient presents today for review of their specialty medication.   Patient is currently taking Evenity for osteoporosis. Patient is managed by Dr. Ronnald Ramp for this.   Dosing: 105 mg qmonth  Adherence: has not yet started   Efficacy: has not yet started. Recommended to complete 12 months of tc  Monitoring:  -S/Sx of injection site rxn: none; counseling given -S/Sx of MACE: none; counseling given  -S/Sx of fracture/ONJ: none; counseling given -Serum calcium: monitored/managed by her specialist -BMD: baseline completed in 2021. Consider reassessment at 6-12 months.   Current adverse effects: - None; has not yet started - GI upset: counseling provided - Arthralgia: counseling provided - HA: counseling provided  O:   Lab Results  Component Value Date   WBC 7.8 01/24/2020   HGB 13.0 01/24/2020   HCT 39.3 01/24/2020   MCV 87.9 01/24/2020   PLT 335.0 01/24/2020      Chemistry      Component Value Date/Time   NA 136 01/24/2020 1114   K 4.4 01/24/2020 1114   CL 98 01/24/2020 1114   CO2 31 01/24/2020 1114   BUN 47 (H) 01/24/2020 1114   CREATININE 1.15 01/24/2020 1114      Component Value Date/Time   CALCIUM 10.8 (H) 01/24/2020 1114   ALKPHOS 76 01/24/2020 1114   AST 17 01/24/2020 1114   ALT 18 01/24/2020 1114   BILITOT 0.6 01/24/2020 1114       A/P: 1. Medication review: patient currently on Evenity for osteoporosis. Reviewed the medication including the following: romosozumab inhibits sclerostin, regulating bone growth. Romosozumab increases bone formation and to a lesser extent, decreases bone resorption. It is a monthly dose consisting of 2 consecutive subQ injections. These should be allowed to sit at room temperature for at least 30 minutes prior to administration. The injections should be given in the abdomen, thigh, or outer area of the upper arm by a health care professional. Sites should be rotated and injections should not be given in broken or damaged  skin. The solution should appear clear to light yellow and should be discarded if cloudy or discolored. Possible adverse effects include injection site reaction, joint pain, HA, peripheral edema. More serious but rare reactions include MACE events, bone fracture, hypocalcemia, and/or ONJ. It does carry a boxed warning for increase risk of MI, stroke and CV death and should be avoided in patients with MACE events within the previous year. All other questions and concerns were addressed with the patient. No recommendations for any changes at this time.   Benard Halsted, PharmD, [facility], [place] [phone]

## 2020-02-21 MED FILL — EVENITY 105 MG/1.17ML SOSY: 105 | 30 days supply | Qty: 2 | Fill #0

## 2020-02-21 MED FILL — SHINGRIX 50 MCG SUS: 50 | 1 days supply | Qty: 1 | Fill #0

## 2020-02-26 ENCOUNTER — Other Ambulatory Visit: Payer: Self-pay

## 2020-02-26 ENCOUNTER — Ambulatory Visit: Payer: 59

## 2020-02-26 DIAGNOSIS — M810 Age-related osteoporosis without current pathological fracture: Secondary | ICD-10-CM

## 2020-02-26 MED ORDER — ROMOSOZUMAB-AQQG 105 MG/1.17ML ~~LOC~~ SOSY
210.0000 mg | PREFILLED_SYRINGE | Freq: Once | SUBCUTANEOUS | Status: DC
Start: 2020-02-26 — End: 2020-03-25

## 2020-02-26 NOTE — Progress Notes (Addendum)
Evenity given.  Please cosign.  Medical screening examination/treatment/procedure(s) were performed by non-physician practitioner and as supervising physician I was immediately available for consultation/collaboration.  I agree with above. Lew Dawes, MD

## 2020-02-28 MED FILL — SHINGRIX 50 MCG SUS: 50 | 1 days supply | Qty: 1 | Fill #1

## 2020-02-28 MED FILL — GABAPENTIN 300 MG CAPSULE: 300 | 90 days supply | Qty: 270 | Fill #0

## 2020-02-28 MED FILL — tiZANidine HCL 2 MG CAPS: 2 | 90 days supply | Qty: 270 | Fill #0

## 2020-03-14 ENCOUNTER — Other Ambulatory Visit (HOSPITAL_COMMUNITY): Payer: Self-pay | Admitting: Pain Medicine

## 2020-03-14 MED FILL — OXYCODONE-APAP 5-325MG: 5-325 | 10 days supply | Qty: 30 | Fill #0

## 2020-03-18 ENCOUNTER — Encounter: Payer: Self-pay | Admitting: Internal Medicine

## 2020-03-20 NOTE — Telephone Encounter (Signed)
Patient spoke with pharmacy they are bringing the evenity on 03.18.22 and she has an appointment on 03.21.22

## 2020-03-21 ENCOUNTER — Other Ambulatory Visit (HOSPITAL_COMMUNITY): Payer: Self-pay | Admitting: Nurse Practitioner

## 2020-03-21 DIAGNOSIS — G894 Chronic pain syndrome: Secondary | ICD-10-CM | POA: Diagnosis not present

## 2020-03-21 DIAGNOSIS — M41115 Juvenile idiopathic scoliosis, thoracolumbar region: Secondary | ICD-10-CM | POA: Diagnosis not present

## 2020-03-21 DIAGNOSIS — Z79891 Long term (current) use of opiate analgesic: Secondary | ICD-10-CM | POA: Diagnosis not present

## 2020-03-22 ENCOUNTER — Telehealth: Payer: Self-pay

## 2020-03-25 ENCOUNTER — Ambulatory Visit (INDEPENDENT_AMBULATORY_CARE_PROVIDER_SITE_OTHER): Payer: 59

## 2020-03-25 ENCOUNTER — Other Ambulatory Visit: Payer: Self-pay

## 2020-03-25 DIAGNOSIS — M810 Age-related osteoporosis without current pathological fracture: Secondary | ICD-10-CM

## 2020-03-25 MED ORDER — ROMOSOZUMAB-AQQG 105 MG/1.17ML ~~LOC~~ SOSY
210.0000 mg | PREFILLED_SYRINGE | Freq: Once | SUBCUTANEOUS | Status: AC
  Administered 2020-03-25: 210 mg via SUBCUTANEOUS

## 2020-03-25 MED FILL — OXYCODONE-APAP 5-325MG: 5-325 | 30 days supply | Qty: 90 | Fill #0

## 2020-03-25 NOTE — Progress Notes (Signed)
Pt here for monthly Evenity injection per Dr Ronnald Ramp.  Evenity 210mg  given subcutaneous in right arm and pt tolerated injection well.  Next Evenity injection scheduled for 04/25/20.

## 2020-03-25 NOTE — Telephone Encounter (Signed)
noted 

## 2020-04-02 ENCOUNTER — Other Ambulatory Visit (HOSPITAL_COMMUNITY): Payer: Self-pay

## 2020-04-17 ENCOUNTER — Other Ambulatory Visit (HOSPITAL_COMMUNITY): Payer: Self-pay

## 2020-04-17 MED FILL — Romosozumab-aqqg Inj Soln Prefilled Syringe 105 MG/1.17ML: SUBCUTANEOUS | 30 days supply | Qty: 2.34 | Fill #0 | Status: AC

## 2020-04-22 ENCOUNTER — Other Ambulatory Visit (HOSPITAL_COMMUNITY): Payer: Self-pay

## 2020-04-23 ENCOUNTER — Other Ambulatory Visit (HOSPITAL_COMMUNITY): Payer: Self-pay

## 2020-04-24 ENCOUNTER — Other Ambulatory Visit (HOSPITAL_COMMUNITY): Payer: Self-pay

## 2020-04-24 ENCOUNTER — Telehealth: Payer: Self-pay

## 2020-04-24 NOTE — Telephone Encounter (Signed)
Evenity delivered today and patient notified.

## 2020-04-25 ENCOUNTER — Other Ambulatory Visit: Payer: Self-pay

## 2020-04-25 ENCOUNTER — Ambulatory Visit (INDEPENDENT_AMBULATORY_CARE_PROVIDER_SITE_OTHER): Payer: 59

## 2020-04-25 ENCOUNTER — Other Ambulatory Visit (HOSPITAL_COMMUNITY): Payer: Self-pay

## 2020-04-25 DIAGNOSIS — M810 Age-related osteoporosis without current pathological fracture: Secondary | ICD-10-CM | POA: Diagnosis not present

## 2020-04-25 MED ORDER — ROMOSOZUMAB-AQQG 105 MG/1.17ML ~~LOC~~ SOSY
210.0000 mg | PREFILLED_SYRINGE | Freq: Once | SUBCUTANEOUS | Status: AC
Start: 2020-04-25 — End: 2020-04-25
  Administered 2020-04-25: 210 mg via SUBCUTANEOUS

## 2020-04-25 MED FILL — Oxycodone w/ Acetaminophen Tab 5-325 MG: ORAL | 30 days supply | Qty: 90 | Fill #0 | Status: AC

## 2020-04-25 NOTE — Progress Notes (Signed)
Pt here for monthlyEvenity injection per Dr. Ronnald Ramp  Evenity 210 mg given Sub Q right arm, and pt tolerated injection well.

## 2020-05-02 ENCOUNTER — Other Ambulatory Visit (HOSPITAL_COMMUNITY): Payer: Self-pay

## 2020-05-06 ENCOUNTER — Other Ambulatory Visit (HOSPITAL_COMMUNITY): Payer: Self-pay

## 2020-05-06 MED FILL — Valsartan Tab 80 MG: ORAL | 90 days supply | Qty: 180 | Fill #0 | Status: AC

## 2020-05-09 ENCOUNTER — Other Ambulatory Visit (HOSPITAL_COMMUNITY): Payer: Self-pay

## 2020-05-09 MED ORDER — SODIUM FLUORIDE 0.2 % MT SOLN
OROMUCOSAL | 5 refills | Status: DC
Start: 2020-05-09 — End: 2020-08-15
  Filled 2020-05-09: qty 473, 30d supply, fill #0

## 2020-05-16 ENCOUNTER — Other Ambulatory Visit (HOSPITAL_COMMUNITY): Payer: Self-pay

## 2020-05-16 MED FILL — Romosozumab-aqqg Inj Soln Prefilled Syringe 105 MG/1.17ML: SUBCUTANEOUS | 30 days supply | Qty: 2.34 | Fill #1 | Status: AC

## 2020-05-20 ENCOUNTER — Telehealth: Payer: Self-pay

## 2020-05-20 NOTE — Telephone Encounter (Signed)
RN requesting evenity be rescheduled to [facility] May 25.  Pt also confirms that Evenity is to be delivered from [place].

## 2020-05-23 ENCOUNTER — Other Ambulatory Visit (HOSPITAL_COMMUNITY): Payer: Self-pay

## 2020-05-24 NOTE — Telephone Encounter (Signed)
Evenity delivered 5.20.22 for appointment on 5.25.22. placed in nurse room fridge

## 2020-05-27 ENCOUNTER — Ambulatory Visit: Payer: 59

## 2020-05-27 ENCOUNTER — Other Ambulatory Visit (HOSPITAL_COMMUNITY): Payer: Self-pay

## 2020-05-27 MED FILL — Tizanidine HCl Cap 2 MG (Base Equivalent): ORAL | 90 days supply | Qty: 270 | Fill #0 | Status: AC

## 2020-05-27 MED FILL — Gabapentin Cap 300 MG: ORAL | 90 days supply | Qty: 270 | Fill #0 | Status: AC

## 2020-05-27 MED FILL — Oxycodone w/ Acetaminophen Tab 5-325 MG: ORAL | 30 days supply | Qty: 90 | Fill #0 | Status: AC

## 2020-05-28 ENCOUNTER — Other Ambulatory Visit (HOSPITAL_COMMUNITY): Payer: Self-pay

## 2020-05-29 ENCOUNTER — Ambulatory Visit (INDEPENDENT_AMBULATORY_CARE_PROVIDER_SITE_OTHER): Payer: 59

## 2020-05-29 ENCOUNTER — Other Ambulatory Visit: Payer: Self-pay

## 2020-05-29 DIAGNOSIS — M810 Age-related osteoporosis without current pathological fracture: Secondary | ICD-10-CM

## 2020-05-29 MED ORDER — ROMOSOZUMAB-AQQG 105 MG/1.17ML ~~LOC~~ SOSY
210.0000 mg | PREFILLED_SYRINGE | Freq: Once | SUBCUTANEOUS | Status: AC
  Administered 2020-05-29: 210 mg via SUBCUTANEOUS

## 2020-05-29 NOTE — Progress Notes (Signed)
Evenity given Left arm Sub Q

## 2020-06-18 ENCOUNTER — Other Ambulatory Visit (HOSPITAL_COMMUNITY): Payer: Self-pay

## 2020-06-19 ENCOUNTER — Other Ambulatory Visit (HOSPITAL_COMMUNITY): Payer: Self-pay

## 2020-06-19 DIAGNOSIS — M5136 Other intervertebral disc degeneration, lumbar region: Secondary | ICD-10-CM | POA: Diagnosis not present

## 2020-06-19 DIAGNOSIS — M41115 Juvenile idiopathic scoliosis, thoracolumbar region: Secondary | ICD-10-CM | POA: Diagnosis not present

## 2020-06-19 DIAGNOSIS — M47814 Spondylosis without myelopathy or radiculopathy, thoracic region: Secondary | ICD-10-CM | POA: Diagnosis not present

## 2020-06-19 MED ORDER — TIZANIDINE HCL 2 MG PO CAPS: 2.0000 mg | ORAL_CAPSULE | Freq: Three times a day (TID) | ORAL | 2 refills | Status: DC

## 2020-06-19 MED ORDER — OXYCODONE-ACETAMINOPHEN 5-325 MG PO TABS
1.0000 | ORAL_TABLET | Freq: Three times a day (TID) | ORAL | 0 refills | Status: DC | PRN
  Filled 2020-07-26: qty 90, 30d supply, fill #0

## 2020-06-19 MED ORDER — OXYCODONE-ACETAMINOPHEN 5-325 MG PO TABS
1.0000 | ORAL_TABLET | Freq: Three times a day (TID) | ORAL | 0 refills | Status: DC | PRN
  Filled 2020-08-27: qty 90, 30d supply, fill #0

## 2020-06-19 MED ORDER — OXYCODONE-ACETAMINOPHEN 5-325 MG PO TABS
1.0000 | ORAL_TABLET | Freq: Three times a day (TID) | ORAL | 0 refills | Status: DC | PRN
  Filled 2020-06-26: qty 90, 30d supply, fill #0

## 2020-06-19 MED ORDER — GABAPENTIN 300 MG PO CAPS
300.0000 mg | ORAL_CAPSULE | Freq: Three times a day (TID) | ORAL | 0 refills | Status: DC
  Filled 2020-09-26: qty 270, 90d supply, fill #0

## 2020-06-19 MED FILL — Romosozumab-aqqg Inj Soln Prefilled Syringe 105 MG/1.17ML: SUBCUTANEOUS | 30 days supply | Qty: 2.34 | Fill #2 | Status: AC

## 2020-06-26 ENCOUNTER — Other Ambulatory Visit (HOSPITAL_COMMUNITY): Payer: Self-pay

## 2020-06-28 ENCOUNTER — Other Ambulatory Visit (HOSPITAL_COMMUNITY): Payer: Self-pay

## 2020-07-01 ENCOUNTER — Telehealth: Payer: Self-pay

## 2020-07-01 NOTE — Telephone Encounter (Signed)
Evenity shot has been received by [facility] from [facility].   Injection has been placed in the nurse room fridge.

## 2020-07-03 ENCOUNTER — Other Ambulatory Visit: Payer: Self-pay

## 2020-07-03 ENCOUNTER — Ambulatory Visit (INDEPENDENT_AMBULATORY_CARE_PROVIDER_SITE_OTHER): Payer: 59

## 2020-07-03 DIAGNOSIS — M810 Age-related osteoporosis without current pathological fracture: Secondary | ICD-10-CM

## 2020-07-03 MED ORDER — ROMOSOZUMAB-AQQG 105 MG/1.17ML ~~LOC~~ SOSY
210.0000 mg | PREFILLED_SYRINGE | SUBCUTANEOUS | Status: DC
  Administered 2020-07-03 – 2020-09-02 (×3): 210 mg via SUBCUTANEOUS

## 2020-07-03 NOTE — Progress Notes (Signed)
Pt here for monthly Evenity injection per Dr Hilary Hertz 105mg  given subcutaneously right arm and pt tolerated injection well.  Next Evenity injection scheduled for 08/02/20.

## 2020-07-10 ENCOUNTER — Ambulatory Visit (HOSPITAL_COMMUNITY)
Admission: EM | Admit: 2020-07-10 | Discharge: 2020-07-10 | Disposition: A | Discharge status: Home / Self Care | Payer: 59 | Attending: Student | Admitting: Student

## 2020-07-10 ENCOUNTER — Other Ambulatory Visit: Payer: Self-pay

## 2020-07-10 ENCOUNTER — Encounter (HOSPITAL_COMMUNITY): Payer: Self-pay | Admitting: *Deleted

## 2020-07-10 DIAGNOSIS — R238 Other skin changes: Secondary | ICD-10-CM

## 2020-07-10 DIAGNOSIS — I10 Essential (primary) hypertension: Secondary | ICD-10-CM | POA: Diagnosis not present

## 2020-07-10 MED ORDER — TRIAMCINOLONE ACETONIDE 0.5 % EX OINT
1.0000 "application " | TOPICAL_OINTMENT | Freq: Two times a day (BID) | CUTANEOUS | 0 refills | Status: AC
  Filled 2020-07-10: qty 30, 15d supply, fill #0

## 2020-07-10 NOTE — ED Provider Notes (Signed)
[place]    CSN: [id number] Arrival date & time: 07/10/20  1635      History   Chief Complaint Chief Complaint  Patient presents with   Blister    HPI Joann Bond is a 55 y.o. female presenting with blister, she states this started from an insect bite.  Medical history hypertension, CKD, spinal stenosis.  States she spent time outside and got mosquito bites on her bilateral LEs from this.  States these started as itchy, but she is now concerned that she has a blister on the back of her left ankle.  Denies fever/chills, discharge from the wounds.  Feeling well otherwise. For hypertension, she does take antihypertensives, but she cannot recall what these are.  States she took her morning dose, but has not taken her evening dose yet.  Denies chest pain, dizziness, shortness of breath, weakness, vision changes, headaches.  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   Age-related osteoporosis with current pathological fracture 01/24/2020   Primary hypertension 01/24/2020   Deficiency anemia 01/24/2020   Encounter for general adult medical examination with abnormal findings 01/24/2020   Stage 3a chronic kidney disease ([place]) 01/24/2020   Essential hypertension 08/16/2019   Spinal stenosis 08/16/2019   H/O spinal fusion 08/16/2019    Past Surgical History:  Procedure Laterality Date   EYE SURGERY     SPINAL FUSION     Scoliosis/ age 43   SPINAL FUSION     spinal stenosis age 19   TONSILLECTOMY      OB History     Gravida  0   Para  0   Term  0   Preterm  0   AB  0   Living  0      SAB  0   IAB  0   Ectopic  0   Multiple  0   Live Births  0            Home Medications    Prior to Admission medications   Medication Sig Start Date End Date Taking? Authorizing Provider  triamcinolone ointment (KENALOG) 0.5 %  Apply 1 application topically 2 (two) times daily for 7 days. 07/10/20 07/17/20 Yes Hazel Sams, PA-C  gabapentin (NEURONTIN) 300 MG capsule Take 300 mg by mouth 3 (three) times daily.    [provider]  gabapentin (NEURONTIN) 300 MG capsule TAKE 1 CAPSULE BY MOUTH THREE TIMES DAILY. 11/16/19 11/15/20  Dorene Ar, MD  gabapentin (NEURONTIN) 300 MG capsule Take 1 capsule (300 mg total) by mouth 3 (three) times daily. 06/19/20     oxyCODONE-acetaminophen (PERCOCET/ROXICET) 5-325 MG tablet Take by mouth 3 (three) times daily.    [provider]  oxyCODONE-acetaminophen (PERCOCET/ROXICET) 5-325 MG tablet TAKE 1 TABLET BY MOUTH EVERY 8 HOURS AS NEEDED FOR PAIN FOR UP TO 30 DAYS. DUE 05/18/20. 03/21/20 09/17/20  Lonell Face, NP  oxyCODONE-acetaminophen (PERCOCET/ROXICET) 5-325 MG tablet TAKE 1 TABLET BY MOUTH EVERY 8 HOURS AS NEEDED FOR UP TO 30 DAYS. DUE 04/21/20. 03/21/20 09/17/20  Lonell Face, NP  oxyCODONE-acetaminophen (PERCOCET/ROXICET) 5-325 MG tablet TAKE 1 TABLET BY MOUTH EVERY 8 HOURS AS NEEDED FOR PAIN FOR UP TO 30 DAYS. DUE 03/23/20. 03/21/20 09/17/20  Lonell Face, NP  oxyCODONE-acetaminophen (PERCOCET/ROXICET) 5-325 MG tablet TAKE ONE TABLET BY MOUTH EVERY 8 (EIGHT) HOURS  AS NEEDED FOR PAIN FOR UP TO 10 DAYS. 03/14/20 09/10/20  Celedonio Miyamoto, MD  oxyCODONE-acetaminophen (PERCOCET/ROXICET) 5-325 MG tablet Take 1 tablet by mouth every 8 (eight) hours as needed for pain for up to 30 days. 06/19/20     oxyCODONE-acetaminophen (PERCOCET/ROXICET) 5-325 MG tablet Take 1 tablet by mouth every 8 (eight) hours as needed for pain for up to 30 days. 07/19/20     oxyCODONE-acetaminophen (PERCOCET/ROXICET) 5-325 MG tablet Take 1 tablet by mouth every 8 (eight) hours as needed for pain for up to 30 days. 08/18/20     Romosozumab-aqqg (EVENITY) 105 MG/1.17ML SOSY injection INJECT 210 MG INTO THE SKIN EVERY 30 DAYS. 02/19/20 02/18/21  Tresa Garter, MD  SODIUM FLUORIDE, DENTAL RINSE,  0.2 % SOLN USE 10 MLS AND SWISH FOR 30 SECONDS AND EXPECTORATE AS DIRECTED NIGHTLY, PREFERABLY BEFORE BEDTIME. DO NOT SWALLOW. 11/09/19 11/08/20    SODIUM FLUORIDE, DENTAL RINSE, 0.2 % SOLN USE AS AN ORAL RINSE, AS DIRECTED ON PACKAGE 05/09/20     tizanidine (ZANAFLEX) 2 MG capsule Take 2 mg by mouth 3 (three) times daily.    [provider]  tizanidine (ZANAFLEX) 2 MG capsule TAKE 1 CAPSULE BY MOUTH THREE TIMES DAILY. 11/16/19 11/15/20  Dorene Ar, MD  tizanidine (ZANAFLEX) 2 MG capsule Take 1 capsule (2 mg total) by mouth 3 (three) times daily. 06/19/20     valsartan (DIOVAN) 80 MG tablet TAKE 1 TABLET (80 MG TOTAL) BY MOUTH 2 (TWO) TIMES DAILY. 01/30/20 01/29/21  Janith Lima, MD  Zoster Vaccine Adjuvanted [facility]) injection TO BE ADMINISTERED BY PHARMACIST. 12/13/19 12/12/20  Carlyle Basques, MD    Family History Family History  Problem Relation Age of Onset   Cancer Mother    Hypertension Mother    Breast cancer Mother     Social History Social History   Tobacco Use   Smoking status: Never   Smokeless tobacco: Never  Vaping Use   Vaping Use: Never used  Substance Use Topics   Alcohol use: Not Currently   Drug use: Never     Allergies   Bactrim [sulfamethoxazole-trimethoprim]   Review of Systems Review of Systems  Skin:  Positive for rash.    Physical Exam Triage Vital Signs ED Triage Vitals  Enc Vitals Group     BP 07/10/20 1742 (!) 188/92     Pulse Rate 07/10/20 1742 (!) 105     Resp 07/10/20 1742 18     Temp 07/10/20 1742 99 F (37.2 C)     Temp src --      SpO2 07/10/20 1742 99 %     Weight --      Height --      Head Circumference --      Peak Flow --      Pain Score 07/10/20 1744 2     Pain Loc --      Pain Edu? --      Excl. in [place]? --    No data found.  Updated Vital Signs BP (!) 188/92   Pulse (!) 105   Temp 99 F (37.2 C)   Resp 18   SpO2 99%   Visual Acuity Right Eye Distance:   Left Eye Distance:   Bilateral Distance:     Right Eye Near:   Left Eye Near:    Bilateral Near:     Physical Exam Vitals reviewed.  Constitutional:      General: She is not in acute distress.  Appearance: Normal appearance. She is not ill-appearing or diaphoretic.  HENT:     Head: Normocephalic and atraumatic.  Cardiovascular:     Rate and Rhythm: Normal rate and regular rhythm.     Heart sounds: Normal heart sounds.  Pulmonary:     Effort: Pulmonary effort is normal.     Breath sounds: Normal breath sounds.  Skin:    General: Skin is warm.     Comments: See images below Multiple erythematous macular spots on Les. One bulla posterior L ankle. No warmth. No discharge.  Neurological:     General: No focal deficit present.     Mental Status: She is alert and oriented to person, place, and time.  Psychiatric:        Mood and Affect: Mood normal.        Behavior: Behavior normal.        Thought Content: Thought content normal.        Judgment: Judgment normal.         UC Treatments / Results  Labs (all labs ordered are listed, but only abnormal results are displayed) Labs Reviewed - No data to display  EKG   Radiology No results found.  Procedures Procedures (including critical care time)  Medications Ordered in UC Medications - No data to display  Initial Impression / Assessment and Plan / UC Course  I have reviewed the triage vital signs and the nursing notes.  Pertinent labs & imaging results that were available during my care of the patient were reviewed by me and considered in my medical decision making (see chart for details).     This patient is a very pleasant 55 y.o. year old female presenting with insect bites and bulla. Afebrile, borderline tachy.  For hypertension, continue current regimen, she has not taken her evening dose yet.  Conservative management with triamcinolone and antihistamine.  Close follow-up with primary care. ED return precautions discussed. Patient verbalizes  understanding and agreement.    Final Clinical Impressions(s) / UC Diagnoses   Final diagnoses:  Skin bulla  Essential hypertension     Discharge Instructions      -Apply triamcinolone ointment twice daily, applied to spots on legs.  Do not apply this to the blister, but you can apply it around it and then over top once it pops. -Over-the-counter antihistamine for symptomatic relief, like Benadryl or Zyrtec. -Wash with gentle soap and water twice daily, air dry or pat to dry. -Do not pop the blister, this will pop on its own.  Popping a blister increases the risk of infection. -Follow-up with your primary care if symptoms worsen instead of improve despite treatment. Please check your blood pressure at home or at the pharmacy. If this continues to be >140/90, follow-up with your primary care provider for further blood pressure management/ medication titration. If you develop chest pain, shortness of breath, vision changes, the worst headache of your life- head straight to the ED or call 911.      ED Prescriptions     Medication Sig Dispense Auth. Provider   triamcinolone ointment (KENALOG) 0.5 % Apply 1 application topically 2 (two) times daily for 7 days. 30 g Hazel Sams, PA-C      PDMP not reviewed this encounter.   Hazel Sams, PA-C 07/10/20 1947

## 2020-07-10 NOTE — Discharge Instructions (Addendum)
-  Apply triamcinolone ointment twice daily, applied to spots on legs.  Do not apply this to the blister, but you can apply it around it and then over top once it pops. -Over-the-counter antihistamine for symptomatic relief, like Benadryl or Zyrtec. -Wash with gentle soap and water twice daily, air dry or pat to dry. -Do not pop the blister, this will pop on its own.  Popping a blister increases the risk of infection. -Follow-up with your primary care if symptoms worsen instead of improve despite treatment. Please check your blood pressure at home or at the pharmacy. If this continues to be >140/90, follow-up with your primary care provider for further blood pressure management/ medication titration. If you develop chest pain, shortness of breath, vision changes, the worst headache of your life- head straight to the ED or call 911.

## 2020-07-10 NOTE — ED Triage Notes (Signed)
Pt reports blister started from insect bite

## 2020-07-11 ENCOUNTER — Other Ambulatory Visit (HOSPITAL_COMMUNITY): Payer: Self-pay

## 2020-07-22 ENCOUNTER — Other Ambulatory Visit (HOSPITAL_COMMUNITY): Payer: Self-pay

## 2020-07-22 MED FILL — Romosozumab-aqqg Inj Soln Prefilled Syringe 105 MG/1.17ML: SUBCUTANEOUS | 30 days supply | Qty: 2.34 | Fill #3 | Status: CN

## 2020-07-24 ENCOUNTER — Ambulatory Visit: Payer: 59 | Admitting: Internal Medicine

## 2020-07-26 ENCOUNTER — Other Ambulatory Visit (HOSPITAL_COMMUNITY): Payer: Self-pay

## 2020-07-26 ENCOUNTER — Other Ambulatory Visit: Payer: Self-pay | Admitting: Internal Medicine

## 2020-07-26 DIAGNOSIS — I10 Essential (primary) hypertension: Secondary | ICD-10-CM

## 2020-07-26 MED ORDER — VALSARTAN 80 MG PO TABS
80.0000 mg | ORAL_TABLET | Freq: Two times a day (BID) | ORAL | 0 refills | Status: DC
  Filled 2020-07-26: qty 180, 90d supply, fill #0

## 2020-08-01 ENCOUNTER — Other Ambulatory Visit (HOSPITAL_COMMUNITY): Payer: Self-pay

## 2020-08-01 MED FILL — Romosozumab-aqqg Inj Soln Prefilled Syringe 105 MG/1.17ML: SUBCUTANEOUS | 30 days supply | Qty: 2.34 | Fill #3 | Status: AC

## 2020-08-02 ENCOUNTER — Ambulatory Visit (INDEPENDENT_AMBULATORY_CARE_PROVIDER_SITE_OTHER): Payer: 59

## 2020-08-02 ENCOUNTER — Other Ambulatory Visit: Payer: Self-pay

## 2020-08-02 ENCOUNTER — Other Ambulatory Visit (HOSPITAL_COMMUNITY): Payer: Self-pay

## 2020-08-02 DIAGNOSIS — M8000XD Age-related osteoporosis with current pathological fracture, unspecified site, subsequent encounter for fracture with routine healing: Secondary | ICD-10-CM

## 2020-08-02 DIAGNOSIS — M810 Age-related osteoporosis without current pathological fracture: Secondary | ICD-10-CM | POA: Diagnosis not present

## 2020-08-02 MED ORDER — ROMOSOZUMAB-AQQG 105 MG/1.17ML ~~LOC~~ SOSY: 210.0000 mg | PREFILLED_SYRINGE | Freq: Once | SUBCUTANEOUS | Status: DC

## 2020-08-02 NOTE — Progress Notes (Signed)
Evenity given.   Please co-sign.  

## 2020-08-05 ENCOUNTER — Other Ambulatory Visit (HOSPITAL_COMMUNITY): Payer: Self-pay

## 2020-08-14 ENCOUNTER — Other Ambulatory Visit: Payer: Self-pay

## 2020-08-14 ENCOUNTER — Ambulatory Visit (INDEPENDENT_AMBULATORY_CARE_PROVIDER_SITE_OTHER): Payer: 59

## 2020-08-14 ENCOUNTER — Ambulatory Visit
Admission: EM | Admit: 2020-08-14 | Discharge: 2020-08-14 | Disposition: A | Discharge status: Home / Self Care | Payer: 59 | Attending: Emergency Medicine | Admitting: Emergency Medicine

## 2020-08-14 DIAGNOSIS — I10 Essential (primary) hypertension: Secondary | ICD-10-CM

## 2020-08-14 DIAGNOSIS — M79671 Pain in right foot: Secondary | ICD-10-CM

## 2020-08-14 DIAGNOSIS — M7731 Calcaneal spur, right foot: Secondary | ICD-10-CM | POA: Diagnosis not present

## 2020-08-14 NOTE — ED Triage Notes (Signed)
Pt c/o right foot pain described mostly on the surface. States started approx 1 week ago but denies any specific injuries to the area. States currently 5/10 achy pain that is constant but worse when weight baring. Has tried ibuprofen at home without relief.

## 2020-08-14 NOTE — Discharge Instructions (Addendum)
Check in with Dr. Ronnald Ramp' office today about your high blood pressure. Although you took your valsartan today, your blood pressure here at Urgent Care is very high: 195/116.   There is no fracture in your right foot. Treat your foot pain as a soft tissue injury. Take ibuprofen 400-'600mg'$  every 4-6 hours for the next 2-3 days and try soaking your foot in a warm epsom salt bath 2 times per day to help with pain. If you continue having foot pain despite rest and treatment, please follow up with Dr. Ronnald Ramp.

## 2020-08-14 NOTE — ED Provider Notes (Signed)
[facility]    CSN: [id number] Arrival date & time: 08/14/20  [id number]      History   Chief Complaint Chief Complaint  Patient presents with   right foot pain    HPI Joann Bond is a 55 y.o. female with R foot pain for a week. 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 Pain worse with weight bearing. Describes pain as ache and burning. Denies swelling or numbness. Hx osteoporosis and pathological fracture of finger.   BP 205/120. Pt reports taking valsartan this morning. Does not check BP at home. 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   Age-related osteoporosis with current pathological fracture 01/24/2020   Primary hypertension 01/24/2020   Deficiency anemia 01/24/2020   Encounter for general adult medical examination with abnormal findings 01/24/2020   Stage 3a chronic kidney disease ([place]) 01/24/2020   Essential hypertension 08/16/2019   Spinal stenosis 08/16/2019   H/O spinal fusion 08/16/2019    Past Surgical History:  Procedure Laterality Date   EYE SURGERY     SPINAL FUSION     Scoliosis/ age 53   SPINAL FUSION     spinal stenosis age 19   TONSILLECTOMY      OB History     Gravida  0   Para  0   Term  0   Preterm  0   AB  0   Living  0      SAB  0   IAB  0   Ectopic  0   Multiple  0   Live Births  0            Home Medications    Prior to Admission medications   Medication Sig Start Date End Date Taking? Authorizing Provider  gabapentin (NEURONTIN) 300 MG capsule Take 300 mg by mouth 3 (three) times daily.    [provider]  gabapentin (NEURONTIN) 300 MG capsule TAKE 1 CAPSULE BY MOUTH THREE TIMES DAILY. 11/16/19  11/15/20  Dorene Ar, MD  gabapentin (NEURONTIN) 300 MG capsule Take 1 capsule (300 mg total) by mouth 3 (three) times daily. 06/19/20     oxyCODONE-acetaminophen (PERCOCET/ROXICET) 5-325 MG tablet Take by mouth 3 (three) times daily.    [provider]  oxyCODONE-acetaminophen (PERCOCET/ROXICET) 5-325 MG tablet TAKE 1 TABLET BY MOUTH EVERY 8 HOURS AS NEEDED FOR PAIN FOR UP TO 30 DAYS. DUE 05/18/20. 03/21/20 09/17/20  Lonell Face, NP  oxyCODONE-acetaminophen (PERCOCET/ROXICET) 5-325 MG tablet TAKE 1 TABLET BY MOUTH EVERY 8 HOURS AS NEEDED FOR UP TO 30 DAYS. DUE 04/21/20. 03/21/20 09/17/20  Lonell Face, NP  oxyCODONE-acetaminophen (PERCOCET/ROXICET) 5-325 MG tablet TAKE 1 TABLET BY MOUTH EVERY 8 HOURS AS NEEDED FOR PAIN FOR UP TO 30 DAYS. DUE 03/23/20. 03/21/20 09/17/20  Lonell Face, NP  oxyCODONE-acetaminophen (PERCOCET/ROXICET) 5-325 MG tablet TAKE ONE TABLET BY MOUTH EVERY 8 (EIGHT) HOURS AS NEEDED FOR PAIN FOR UP TO 10 DAYS. 03/14/20 09/10/20  Celedonio Miyamoto, MD  oxyCODONE-acetaminophen (PERCOCET/ROXICET) 5-325 MG tablet Take 1 tablet by mouth every 8 (eight) hours as needed for pain for up to 30 days. 06/19/20     oxyCODONE-acetaminophen (PERCOCET/ROXICET) 5-325 MG tablet  Take 1 tablet by mouth every 8 (eight) hours as needed for pain for up to 30 days. 07/19/20     oxyCODONE-acetaminophen (PERCOCET/ROXICET) 5-325 MG tablet Take 1 tablet by mouth every 8 (eight) hours as needed for pain for up to 30 days. 08/18/20     Romosozumab-aqqg (EVENITY) 105 MG/1.17ML SOSY injection INJECT 210 MG INTO THE SKIN EVERY 30 DAYS. 02/19/20 02/18/21  Tresa Garter, MD  SODIUM FLUORIDE, DENTAL RINSE, 0.2 % SOLN USE 10 MLS AND SWISH FOR 30 SECONDS AND EXPECTORATE AS DIRECTED NIGHTLY, PREFERABLY BEFORE BEDTIME. DO NOT SWALLOW. 11/09/19 11/08/20    SODIUM FLUORIDE, DENTAL RINSE, 0.2 % SOLN USE AS AN ORAL RINSE, AS DIRECTED ON PACKAGE 05/09/20     tizanidine (ZANAFLEX) 2 MG capsule Take 2 mg by mouth 3 (three)  times daily.    [provider]  tizanidine (ZANAFLEX) 2 MG capsule TAKE 1 CAPSULE BY MOUTH THREE TIMES DAILY. 11/16/19 11/15/20  Dorene Ar, MD  tizanidine (ZANAFLEX) 2 MG capsule Take 1 capsule (2 mg total) by mouth 3 (three) times daily. 06/19/20     valsartan (DIOVAN) 80 MG tablet Take 1 tablet (80 mg total) by mouth 2 (two) times daily. 07/26/20   Janith Lima, MD  Zoster Vaccine Adjuvanted [facility]) injection TO BE ADMINISTERED BY PHARMACIST. 12/13/19 12/12/20  Carlyle Basques, MD    Family History Family History  Problem Relation Age of Onset   Cancer Mother    Hypertension Mother    Breast cancer Mother     Social History Social History   Tobacco Use   Smoking status: Never   Smokeless tobacco: Never  Vaping Use   Vaping Use: Never used  Substance Use Topics   Alcohol use: Not Currently   Drug use: Never     Allergies   Bactrim [sulfamethoxazole-trimethoprim]   Review of Systems Review of Systems  Respiratory:  Negative for chest tightness and shortness of breath.   Cardiovascular:  Negative for chest pain and palpitations.  Musculoskeletal:        R foot pain worse with weight bearing  Skin:  Negative for color change, rash and wound.  Neurological:  Negative for weakness, numbness and headaches.    Physical Exam Triage Vital Signs ED Triage Vitals  Enc Vitals Group     BP 08/14/20 1001 (!) 205/120     Pulse Rate 08/14/20 0957 82     Resp 08/14/20 0957 18     Temp 08/14/20 0957 98.1 F (36.7 C)     Temp Source 08/14/20 0957 Oral     SpO2 08/14/20 0957 98 %     Weight --      Height --      Head Circumference --      Peak Flow --      Pain Score 08/14/20 0958 5     Pain Loc --      Pain Edu? --      Excl. in [place]? --    No data found.  Updated Vital Signs BP (!) 195/116 (BP Location: Right Arm)   Pulse 82   Temp 98.1 F (36.7 C) (Oral)   Resp 18   SpO2 97%   Visual Acuity Right Eye Distance:   Left Eye Distance:    Bilateral Distance:    Right Eye Near:   Left Eye Near:    Bilateral Near:     Physical Exam Constitutional:      Appearance: Normal appearance.  Cardiovascular:  Pulses:          Dorsalis pedis pulses are 2+ on the right side and 2+ on the left side.       Posterior tibial pulses are 2+ on the right side and 2+ on the left side.  Musculoskeletal:        General: No swelling, deformity or signs of injury.     Right foot: Normal range of motion and normal capillary refill. Tenderness and bony tenderness present. No swelling or deformity. Normal pulse.     Left foot: Normal.       Feet:     Comments: Pain with palpation mild. Limping gait favors R foot.   Feet:     Right foot:     Skin integrity: Skin integrity normal.     Left foot:     Skin integrity: Skin integrity normal.  Neurological:     Mental Status: She is alert.     UC Treatments / Results  Labs (all labs ordered are listed, but only abnormal results are displayed) Labs Reviewed - No data to display  EKG   Radiology DG Foot Complete Right  Result Date: 08/14/2020 CLINICAL DATA:  Pain with weight-bearing for week EXAM: RIGHT FOOT COMPLETE - 3+ VIEW COMPARISON:  None. FINDINGS: There is no acute fracture or dislocation. Alignment is normal. The Lisfranc and Chopart joints are intact. The joint spaces are preserved. There is mild Achilles enthesopathy and inferior calcaneal spurring. The soft tissues are unremarkable. IMPRESSION: 1. No acute fracture or dislocation. 2. Mild inferior calcaneal spurring and Achilles enthesopathy. Electronically Signed   By: Valetta Mole MD   On: 08/14/2020 10:46    Procedures Procedures (including critical care time)  Medications Ordered in UC Medications - No data to display  Initial Impression / Assessment and Plan / UC Course  I have reviewed the triage vital signs and the nursing notes.  Pertinent labs & imaging results that were available during my care of the patient  were reviewed by me and considered in my medical decision making (see chart for details).  No fracture on xray of R foot. No skin changes. Pain is not in spinal or peripheral nerve distribution, is localized. Recommended rest, ibuprofen, epsom salt soaks.   BP very elevated today at Urgent Care. Pt reports taking medicine this morning. Denies CV symptoms. Instructed to contact PCP's office today for guidance.   Final Clinical Impressions(s) / UC Diagnoses   Final diagnoses:  Uncontrolled hypertension  Right foot pain     Discharge Instructions      Check in with Dr. Ronnald Ramp' office today about your high blood pressure. Although you took your valsartan today, your blood pressure here at Urgent Care is very high: 195/116.   There is no fracture in your right foot. Treat your foot pain as a soft tissue injury. Take ibuprofen 400-'600mg'$  every 4-6 hours for the next 2-3 days and try soaking your foot in a warm epsom salt bath 2 times per day to help with pain. If you continue having foot pain despite rest and treatment, please follow up with Dr. Ronnald Ramp.      ED Prescriptions   None    PDMP not reviewed this encounter.   Carvel Getting, NP 08/14/20 1112

## 2020-08-14 NOTE — Progress Notes (Signed)
Subjective:    Patient ID: Joann Bond, female    DOB: 06/23/1965, 55 y.o.   MRN: [id number]  HPI The patient is here for an acute visit.   Went to UC on 8/10 and BP was 195/116 - she rechecked it at home it was 204/122 and 188/135.  This morning 187/137.  She is taking diovan 80 mg bid.    She has a strong family history of htn - dad, mom, brother.  Her dad died from htn.  She denies any new medications, supplements.  She tries to eat a low-sodium diet.  She denies any steroid injections recently.  She is on Evenity, but has been for several months.  She is not on any hormone replacement.  She had a headache today and has intermittent headaches.  She assume some of her headaches were related to work.  She does not check her blood pressure on a regular basis because her blood pressure has been well controlled.   Medications and allergies reviewed with patient and updated if appropriate.  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     gabapentin (NEURONTIN) 300 MG capsule Take 1 capsule (300 mg total) by mouth 3 (three) times daily. 270 capsule 0   oxyCODONE-acetaminophen (PERCOCET/ROXICET) 5-325 MG tablet TAKE ONE TABLET BY MOUTH EVERY 8 (EIGHT) HOURS AS NEEDED FOR PAIN FOR UP TO 10 DAYS. 30 tablet 0   Romosozumab-aqqg (EVENITY) 105 MG/1.17ML SOSY injection INJECT 210 MG INTO THE SKIN EVERY  30 DAYS. 2.34 mL 11   SODIUM FLUORIDE, DENTAL RINSE, 0.2 % SOLN USE 10 MLS AND SWISH FOR 30 SECONDS AND EXPECTORATE AS DIRECTED NIGHTLY, PREFERABLY BEFORE BEDTIME. DO NOT SWALLOW. 473 mL 1   tizanidine (ZANAFLEX) 2 MG capsule Take 2 mg by mouth 3 (three) times daily.     valsartan (DIOVAN) 80 MG tablet Take 1 tablet (80 mg total) by mouth 2 (two) times daily. 180 tablet 0   Zoster Vaccine Adjuvanted (SHINGRIX) injection TO BE ADMINISTERED BY PHARMACIST. 1 each 1   Current Facility-Administered Medications on File Prior to Visit  Medication Dose Route Frequency Provider Last Rate Last Admin   Romosozumab-aqqg (EVENITY) 105 MG/1.17ML injection 210 mg  210 mg Subcutaneous Q30 days Janith Lima, MD   210 mg at 08/02/20 1055    Past Medical History:  Diagnosis Date   Asthma    Hypertension     Past Surgical History:  Procedure Laterality Date   EYE SURGERY     SPINAL FUSION     Scoliosis/ age 2   SPINAL FUSION     spinal stenosis age 54   TONSILLECTOMY      Social History   Socioeconomic History   Marital status: Single    Spouse name: Not on file   Number of children: Not on file   Years of education: Not on file   Highest education level: Not on file  Occupational History   Not on file  Tobacco Use   Smoking status: Never   Smokeless tobacco: Never  Vaping Use   Vaping Use: Never used  Substance and Sexual Activity   Alcohol use: Not Currently   Drug use: Never   Sexual activity: Not Currently  Other Topics Concern   Not on file  Social History Narrative   Not on file   Social Determinants of Health   Financial Resource Strain: Not on file  Food Insecurity: Not on file  Transportation Needs: Not on file  Physical Activity: Not on file  Stress: Not on file  Social Connections: Not on file    Family History  Problem Relation Age of Onset   Cancer Mother    Hypertension Mother    Breast cancer Mother     Review of Systems  Constitutional:  Negative  for fever.  HENT:         Some allergy symptoms  Eyes:  Negative for visual disturbance.  Respiratory:  Positive for wheezing (from allergies). Negative for cough and shortness of breath.   Cardiovascular:  Negative for chest pain, palpitations and leg swelling.  Neurological:  Positive for headaches. Negative for dizziness and light-headedness.      Objective:   Vitals:   08/15/20 1103  BP: (!) 180/120  Pulse: 85  Temp: 98.5 F (36.9 C)  SpO2: 97%   BP Readings from Last 3 Encounters:  08/15/20 (!) 180/120  08/14/20 (!) 195/116  07/10/20 (!) 188/92   Wt Readings from Last 3 Encounters:  08/15/20 143 lb (64.9 kg)  01/24/20 142 lb (64.4 kg)  10/09/19 144 lb (65.3 kg)   Body mass index is 25.33 kg/m.   Physical Exam    Constitutional: Appears well-developed and well-nourished. No distress.  Head: Normocephalic and atraumatic.  Neck: Neck supple. No tracheal deviation present. No thyromegaly present.  No cervical lymphadenopathy Cardiovascular: Normal rate, regular rhythm and normal heart sounds.  No murmur heard. No carotid bruit .  No edema Pulmonary/Chest: Effort normal and breath sounds normal. No respiratory distress. No has no wheezes. No rales.  Skin: Skin is warm and dry. Not diaphoretic.  Psychiatric: Normal mood and affect. Behavior is normal.       Assessment & Plan:    Hypertension:   Chronic-acutely not controlled No obvious cause for BP being so high As far as I know the Evenity is not likely to cause htn Continue valsartan 80 mg twice daily Start Bystolic 5 mg daily She will start monitoring her BP at home on a daily basis and keep a log Has follow-up with Dr. Ronnald Ramp in 2 weeks She will call sooner if her blood pressure does not improve with the addition of the Bystolic     This visit occurred during the SARS-CoV-2 public health emergency.  Safety protocols were in place, including screening questions prior to the visit, additional usage of  staff PPE, and extensive cleaning of exam room while observing appropriate contact time as indicated for disinfecting solutions.

## 2020-08-15 ENCOUNTER — Other Ambulatory Visit (HOSPITAL_COMMUNITY): Payer: Self-pay

## 2020-08-15 ENCOUNTER — Encounter: Payer: Self-pay | Admitting: Internal Medicine

## 2020-08-15 ENCOUNTER — Ambulatory Visit: Payer: 59 | Admitting: Internal Medicine

## 2020-08-15 VITALS — BP 180/120 | HR 85 | Temp 98.5°F | Ht 63.0 in | Wt 143.0 lb

## 2020-08-15 DIAGNOSIS — I10 Essential (primary) hypertension: Secondary | ICD-10-CM

## 2020-08-15 MED ORDER — NEBIVOLOL HCL 5 MG PO TABS
5.0000 mg | ORAL_TABLET | Freq: Every day | ORAL | 3 refills | Status: DC
  Filled 2020-08-15: qty 30, 30d supply, fill #0
  Filled 2020-09-12: qty 30, 30d supply, fill #1
  Filled 2020-10-10: qty 30, 30d supply, fill #2
  Filled 2020-11-04: qty 30, 30d supply, fill #3

## 2020-08-15 MED FILL — Romosozumab-aqqg Inj Soln Prefilled Syringe 105 MG/1.17ML: SUBCUTANEOUS | 30 days supply | Qty: 2.34 | Fill #4 | Status: AC

## 2020-08-15 NOTE — Patient Instructions (Addendum)
    Medications changes include :   start bystolic 5 mg daily  Your prescription(s) have been submitted to your pharmacy. Please take as directed and contact our office if you believe you are having problem(s) with the medication(s).   Monitor and log your BP.    Follow up with Dr Ronnald Ramp in 2 weeks.

## 2020-08-16 ENCOUNTER — Encounter: Payer: Self-pay | Admitting: Internal Medicine

## 2020-08-17 ENCOUNTER — Other Ambulatory Visit: Payer: Self-pay | Admitting: Internal Medicine

## 2020-08-17 ENCOUNTER — Other Ambulatory Visit (HOSPITAL_COMMUNITY): Payer: Self-pay

## 2020-08-17 DIAGNOSIS — I10 Essential (primary) hypertension: Secondary | ICD-10-CM

## 2020-08-17 MED ORDER — INDAPAMIDE 1.25 MG PO TABS
1.2500 mg | ORAL_TABLET | Freq: Every day | ORAL | 0 refills | Status: DC
  Filled 2020-08-17: qty 90, 90d supply, fill #0

## 2020-08-21 ENCOUNTER — Other Ambulatory Visit: Payer: Self-pay

## 2020-08-21 ENCOUNTER — Ambulatory Visit (INDEPENDENT_AMBULATORY_CARE_PROVIDER_SITE_OTHER): Payer: 59 | Admitting: Internal Medicine

## 2020-08-21 ENCOUNTER — Encounter: Payer: Self-pay | Admitting: Internal Medicine

## 2020-08-21 ENCOUNTER — Ambulatory Visit: Payer: 59

## 2020-08-21 ENCOUNTER — Other Ambulatory Visit (HOSPITAL_COMMUNITY): Payer: Self-pay

## 2020-08-21 VITALS — BP 166/102 | HR 70 | Temp 98.5°F | Ht 63.0 in | Wt 143.0 lb

## 2020-08-21 DIAGNOSIS — I10 Essential (primary) hypertension: Secondary | ICD-10-CM | POA: Diagnosis not present

## 2020-08-21 LAB — CBC WITH DIFFERENTIAL/PLATELET
Basophils Absolute: 0.1 10*3/uL (ref 0.0–0.1)
Basophils Relative: 1.1 % (ref 0.0–3.0)
Eosinophils Absolute: 0.3 10*3/uL (ref 0.0–0.7)
Eosinophils Relative: 4.5 % (ref 0.0–5.0)
HCT: 39.3 % (ref 36.0–46.0)
Hemoglobin: 13.1 g/dL (ref 12.0–15.0)
Lymphocytes Relative: 26.5 % (ref 12.0–46.0)
Lymphs Abs: 1.8 10*3/uL (ref 0.7–4.0)
MCHC: 33.2 g/dL (ref 30.0–36.0)
MCV: 88.7 fl (ref 78.0–100.0)
Monocytes Absolute: 0.5 10*3/uL (ref 0.1–1.0)
Monocytes Relative: 7.1 % (ref 3.0–12.0)
Neutro Abs: 4 10*3/uL (ref 1.4–7.7)
Neutrophils Relative %: 60.8 % (ref 43.0–77.0)
Platelets: 282 10*3/uL (ref 150.0–400.0)
RBC: 4.44 Mil/uL (ref 3.87–5.11)
RDW: 14 % (ref 11.5–15.5)
WBC: 6.7 10*3/uL (ref 4.0–10.5)

## 2020-08-21 LAB — URINALYSIS, ROUTINE W REFLEX MICROSCOPIC
Bilirubin Urine: NEGATIVE
Ketones, ur: NEGATIVE
Leukocytes,Ua: NEGATIVE
Nitrite: NEGATIVE
Specific Gravity, Urine: 1.025 (ref 1.000–1.030)
Total Protein, Urine: NEGATIVE
Urine Glucose: NEGATIVE
Urobilinogen, UA: 0.2 (ref 0.0–1.0)
pH: 5.5 (ref 5.0–8.0)

## 2020-08-21 LAB — BASIC METABOLIC PANEL
BUN: 26 mg/dL — ABNORMAL HIGH (ref 6–23)
CO2: 30 mEq/L (ref 19–32)
Calcium: 9.6 mg/dL (ref 8.4–10.5)
Chloride: 102 mEq/L (ref 96–112)
Creatinine, Ser: 1.06 mg/dL (ref 0.40–1.20)
GFR: 59.12 mL/min — ABNORMAL LOW (ref >60.00)
Glucose, Bld: 105 mg/dL — ABNORMAL HIGH (ref 70–99)
Potassium: 4.3 mEq/L (ref 3.5–5.1)
Sodium: 141 mEq/L (ref 135–145)

## 2020-08-21 LAB — TSH: TSH: 0.98 u[IU]/mL (ref 0.35–5.50)

## 2020-08-21 MED ORDER — AMLODIPINE BESYLATE 5 MG PO TABS
5.0000 mg | ORAL_TABLET | Freq: Every day | ORAL | 0 refills | Status: DC
  Filled 2020-08-21: qty 90, 90d supply, fill #0

## 2020-08-21 NOTE — Progress Notes (Signed)
Subjective:  Patient ID: Joann Bond, female    DOB: [birth year]  Age: 55 y.o. MRN: [id number]  CC: Hypertension  This visit occurred during the SARS-CoV-2 public health emergency.  Safety protocols were in place, including screening questions prior to the visit, additional usage of staff PPE, and extensive cleaning of exam room while observing appropriate contact time as indicated for disinfecting solutions.    HPI Joann Bond presents for f/up -   She has recently developed rather significant hypertension.  She has had a few headaches recently and has been taking Motrin.  She was recently started on nebivolol and valsartan.  She subsequently called and I added indapamide.  She denies blurred vision, chest pain, shortness of breath, abdominal pain, dysuria, hematuria, edema, diaphoresis, dizziness, or lightheadedness.  Outpatient Medications Prior to Visit  Medication Sig Dispense Refill   gabapentin (NEURONTIN) 300 MG capsule Take 300 mg by mouth 3 (three) times daily.     gabapentin (NEURONTIN) 300 MG capsule Take 1 capsule (300 mg total) by mouth 3 (three) times daily. 270 capsule 0   indapamide (LOZOL) 1.25 MG tablet Take 1 tablet (1.25 mg total) by mouth daily. 90 tablet 0   nebivolol (BYSTOLIC) 5 MG tablet Take 1 tablet (5 mg total) by mouth daily. 30 tablet 3   oxyCODONE-acetaminophen (PERCOCET/ROXICET) 5-325 MG tablet TAKE ONE TABLET BY MOUTH EVERY 8 (EIGHT) HOURS AS NEEDED FOR PAIN FOR UP TO 10 DAYS. 30 tablet 0   Romosozumab-aqqg (EVENITY) 105 MG/1.17ML SOSY injection INJECT 210 MG INTO THE SKIN EVERY 30 DAYS. 2.34 mL 11   SODIUM FLUORIDE, DENTAL RINSE, 0.2 % SOLN USE 10 MLS AND SWISH FOR 30 SECONDS AND EXPECTORATE AS DIRECTED NIGHTLY, PREFERABLY BEFORE BEDTIME. DO NOT SWALLOW. 473 mL 1   tizanidine (ZANAFLEX) 2 MG capsule Take 2 mg by mouth 3 (three) times daily.     valsartan (DIOVAN) 80 MG tablet Take 1 tablet (80 mg total) by mouth 2 (two) times daily. 180 tablet 0   Zoster  Vaccine Adjuvanted (SHINGRIX) injection TO BE ADMINISTERED BY PHARMACIST. 1 each 1   Facility-Administered Medications Prior to Visit  Medication Dose Route Frequency Provider Last Rate Last Admin   Romosozumab-aqqg (EVENITY) 105 MG/1.17ML injection 210 mg  210 mg Subcutaneous Q30 days Joann Lima, MD   210 mg at 08/02/20 1055    ROS Review of Systems  Constitutional:  Negative for chills, diaphoresis, fatigue and fever.  HENT: Negative.    Eyes: Negative.   Respiratory:  Negative for cough, chest tightness, shortness of breath and wheezing.   Cardiovascular:  Negative for chest pain, palpitations and leg swelling.  Gastrointestinal:  Negative for abdominal pain, constipation, diarrhea, nausea and vomiting.  Endocrine: Negative.   Genitourinary: Negative.  Negative for difficulty urinating, dysuria and hematuria.  Musculoskeletal:  Positive for arthralgias. Negative for myalgias.  Skin: Negative.   Neurological:  Positive for headaches. Negative for weakness and light-headedness.  Hematological:  Negative for adenopathy. Does not bruise/bleed easily.   Objective:  BP (!) 166/102 (BP Location: Left Arm, Patient Position: Sitting, Cuff Size: Large)   Pulse 70   Temp 98.5 F (36.9 C) (Oral)   Ht '5\' 3"'$  (1.6 m)   Wt 143 lb (64.9 kg)   SpO2 97%   BMI 25.33 kg/m   BP Readings from Last 3 Encounters:  08/21/20 (!) 166/102  08/21/20 (!) 150/100  08/15/20 (!) 180/120    Wt Readings from Last 3 Encounters:  08/21/20 143 lb (64.9 kg)  08/15/20 143 lb (64.9 kg)  01/24/20 142 lb (64.4 kg)    Physical Exam Vitals reviewed.  HENT:     Nose: Nose normal.     Mouth/Throat:     Mouth: Mucous membranes are moist.  Eyes:     Conjunctiva/sclera: Conjunctivae normal.  Cardiovascular:     Rate and Rhythm: Normal rate and regular rhythm.     Heart sounds: No murmur heard.    Comments: EKG- NSR, 63 bpm LAE No LVH or Q waves No old EKG's for comparison Pulmonary:     Breath  sounds: No stridor. No wheezing, rhonchi or rales.  Abdominal:     General: Abdomen is flat.     Palpations: There is no mass.     Tenderness: There is no abdominal tenderness. There is no guarding.     Hernia: No hernia is present.  Musculoskeletal:        General: Normal range of motion.     Cervical back: Neck supple.     Right lower leg: No edema.     Left lower leg: No edema.  Lymphadenopathy:     Cervical: No cervical adenopathy.  Skin:    General: Skin is warm and dry.  Neurological:     General: No focal deficit present.     Mental Status: She is alert.  Psychiatric:        Mood and Affect: Mood normal.        Behavior: Behavior normal.   Lab Results  Component Value Date   WBC 6.7 08/21/2020   HGB 13.1 08/21/2020   HCT 39.3 08/21/2020   PLT 282.0 08/21/2020   GLUCOSE 105 (H) 08/21/2020   CHOL 223 (H) 01/24/2020   TRIG 279.0 (H) 01/24/2020   HDL 55.70 01/24/2020   LDLDIRECT 137.0 01/24/2020   ALT 18 01/24/2020   AST 17 01/24/2020   NA 141 08/21/2020   K 4.3 08/21/2020   CL 102 08/21/2020   CREATININE 1.06 08/21/2020   BUN 26 (H) 08/21/2020   CO2 30 08/21/2020   TSH 0.98 08/21/2020    DG Foot Complete Right  Result Date: 08/14/2020 CLINICAL DATA:  Pain with weight-bearing for week EXAM: RIGHT FOOT COMPLETE - 3+ VIEW COMPARISON:  None. FINDINGS: There is no acute fracture or dislocation. Alignment is normal. The Lisfranc and Chopart joints are intact. The joint spaces are preserved. There is mild Achilles enthesopathy and inferior calcaneal spurring. The soft tissues are unremarkable. IMPRESSION: 1. No acute fracture or dislocation. 2. Mild inferior calcaneal spurring and Achilles enthesopathy. Electronically Signed   By: Valetta Mole MD   On: 08/14/2020 10:46    Assessment & Plan:   Joann Bond was seen today for hypertension.  Diagnoses and all orders for this visit:  Primary hypertension- Her blood pressure remains poorly controlled and is consistent with  malignant hypertension.  I will check labs to screen for secondary causes.  I recommended an MR angio to screen for renal artery stenosis.  Will add amlodipine to her current regimen. -     CBC with Differential/Platelet; Future -     Basic metabolic panel; Future -     Urinalysis, Routine w reflex microscopic; Future -     EKG 12-Lead -     Aldosterone + renin activity w/ ratio; Future -     TSH; Future -     amLODipine (NORVASC) 5 MG tablet; Take 1 tablet (5 mg total) by mouth daily. -     TSH -  Aldosterone + renin activity w/ ratio -     Urinalysis, Routine w reflex microscopic -     Basic metabolic panel -     CBC with Differential/Platelet -     MR ANGIO ABDOMEN W WO CONTRAST; Future  I am having Joann Bond start on amLODipine. I am also having her maintain her gabapentin, tizanidine, oxyCODONE-acetaminophen, Zoster Vaccine Adjuvanted, SODIUM FLUORIDE (DENTAL RINSE), Romosozumab-aqqg, gabapentin, valsartan, nebivolol, and indapamide. We will continue to administer Romosozumab-aqqg.  Meds ordered this encounter  Medications   amLODipine (NORVASC) 5 MG tablet    Sig: Take 1 tablet (5 mg total) by mouth daily.    Dispense:  90 tablet    Refill:  0      Follow-up: Return in about 3 weeks (around 09/11/2020).  Scarlette Calico, MD

## 2020-08-21 NOTE — Progress Notes (Signed)
Pt is here for a BP check before starting BP meds per Dr.Jones.  BP: 160/120 LA  BP: 150/100 RA  *Provider will see pt to day to discuss BP meds and possible change of medication.

## 2020-08-21 NOTE — Patient Instructions (Signed)

## 2020-08-26 ENCOUNTER — Other Ambulatory Visit (HOSPITAL_COMMUNITY): Payer: Self-pay

## 2020-08-26 ENCOUNTER — Telehealth: Payer: Self-pay | Admitting: Internal Medicine

## 2020-08-26 NOTE — Telephone Encounter (Signed)
Patient was seen by Dr. Quay Burow 08.11.22 for HBP  Patient says she was going over med list w/ nurse   Patient says pharmacy advised her that nurse removed oxyCODONE-acetaminophen (PERCOCET/ROXICET) 5-325 MG tablet from med list  Patient says she had 1 more refill left & she doesn't go to see pain dr until sept  Please follow up [phone]

## 2020-08-27 ENCOUNTER — Other Ambulatory Visit (HOSPITAL_COMMUNITY): Payer: Self-pay

## 2020-08-27 LAB — ALDOSTERONE + RENIN ACTIVITY W/ RATIO
Aldosterone: <1 ng/dL
Renin Activity: 0.34 ng/mL/h (ref 0.25–5.82)

## 2020-08-27 NOTE — Telephone Encounter (Signed)
Medication has been added back to list.

## 2020-08-28 ENCOUNTER — Other Ambulatory Visit (HOSPITAL_COMMUNITY): Payer: Self-pay

## 2020-09-01 ENCOUNTER — Other Ambulatory Visit: Payer: Self-pay

## 2020-09-02 ENCOUNTER — Encounter: Payer: Self-pay | Admitting: Internal Medicine

## 2020-09-02 ENCOUNTER — Ambulatory Visit: Payer: 59 | Admitting: Internal Medicine

## 2020-09-02 VITALS — BP 138/78 | HR 73 | Temp 98.2°F | Resp 16 | Ht 63.0 in | Wt 141.0 lb

## 2020-09-02 DIAGNOSIS — M810 Age-related osteoporosis without current pathological fracture: Secondary | ICD-10-CM

## 2020-09-02 DIAGNOSIS — I10 Essential (primary) hypertension: Secondary | ICD-10-CM

## 2020-09-02 MED ORDER — ROMOSOZUMAB-AQQG 105 MG/1.17ML ~~LOC~~ SOSY: 210.0000 mg | PREFILLED_SYRINGE | Freq: Once | SUBCUTANEOUS | Status: DC

## 2020-09-02 NOTE — Progress Notes (Signed)
Subjective:  Patient ID: Joann Bond, female    DOB: 1965/08/09  Age: 55 y.o. MRN: [id number]  CC: Hypertension  This visit occurred during the SARS-CoV-2 public health emergency.  Safety protocols were in place, including screening questions prior to the visit, additional usage of staff PPE, and extensive cleaning of exam room while observing appropriate contact time as indicated for disinfecting solutions.    HPI Joann Bond presents for f/up -  She tells me she is compliant with all 4 antihypertensives.  She has been concerned recently that she has had some systolic blood pressures in the upper 90s and low 100s.  She denies headache, dizziness, lightheadedness, blurred vision, chest pain, shortness of breath, edema, or palpitations.  She is scheduled soon to have the MRA of her renal arteries. She intermittently takes motrin for MSK pain.  Outpatient Medications Prior to Visit  Medication Sig Dispense Refill   amLODipine (NORVASC) 5 MG tablet Take 1 tablet (5 mg total) by mouth daily. 90 tablet 0   gabapentin (NEURONTIN) 300 MG capsule Take 300 mg by mouth 3 (three) times daily.     gabapentin (NEURONTIN) 300 MG capsule Take 1 capsule (300 mg total) by mouth 3 (three) times daily. 270 capsule 0   nebivolol (BYSTOLIC) 5 MG tablet Take 1 tablet (5 mg total) by mouth daily. 30 tablet 3   oxyCODONE-acetaminophen (PERCOCET/ROXICET) 5-325 MG tablet Take 1 tablet by mouth every 8 (eight) hours as needed for pain for up to 30 days. 90 tablet 0   Romosozumab-aqqg (EVENITY) 105 MG/1.17ML SOSY injection INJECT 210 MG INTO THE SKIN EVERY 30 DAYS. 2.34 mL 11   SODIUM FLUORIDE, DENTAL RINSE, 0.2 % SOLN USE 10 MLS AND SWISH FOR 30 SECONDS AND EXPECTORATE AS DIRECTED NIGHTLY, PREFERABLY BEFORE BEDTIME. DO NOT SWALLOW. 473 mL 1   tizanidine (ZANAFLEX) 2 MG capsule Take 2 mg by mouth 3 (three) times daily.     valsartan (DIOVAN) 80 MG tablet Take 1 tablet (80 mg total) by mouth 2 (two) times daily. 180  tablet 0   Zoster Vaccine Adjuvanted (SHINGRIX) injection TO BE ADMINISTERED BY PHARMACIST. 1 each 1   indapamide (LOZOL) 1.25 MG tablet Take 1 tablet (1.25 mg total) by mouth daily. 90 tablet 0   Romosozumab-aqqg (EVENITY) 105 MG/1.17ML injection 210 mg      No facility-administered medications prior to visit.    ROS Review of Systems  Constitutional:  Negative for diaphoresis and fatigue.  HENT: Negative.    Eyes: Negative.   Respiratory:  Negative for chest tightness, shortness of breath and wheezing.   Cardiovascular:  Negative for chest pain, palpitations and leg swelling.  Gastrointestinal:  Negative for abdominal pain, constipation, diarrhea, nausea and vomiting.  Endocrine: Negative.   Genitourinary: Negative.  Negative for difficulty urinating and hematuria.  Musculoskeletal:  Positive for arthralgias. Negative for myalgias.  Skin: Negative.   Neurological:  Negative for dizziness, weakness, light-headedness and headaches.  Hematological:  Negative for adenopathy. Does not bruise/bleed easily.  Psychiatric/Behavioral: Negative.     Objective:  BP 138/78 (BP Location: Left Arm, Patient Position: Sitting, Cuff Size: Large) Comment: BP (R) 136/78 (L) 138/78  Pulse 73   Temp 98.2 F (36.8 C) (Oral)   Resp 16   Ht '5\' 3"'$  (1.6 m)   Wt 141 lb (64 kg)   SpO2 97%   BMI 24.98 kg/m   BP Readings from Last 3 Encounters:  09/02/20 138/78  08/21/20 (!) 166/102  08/21/20 (!) 150/100  Wt Readings from Last 3 Encounters:  09/02/20 141 lb (64 kg)  08/21/20 143 lb (64.9 kg)  08/15/20 143 lb (64.9 kg)    Physical Exam Vitals reviewed.  Constitutional:      Appearance: Normal appearance.  HENT:     Mouth/Throat:     Mouth: Mucous membranes are moist.  Eyes:     General: No scleral icterus.    Conjunctiva/sclera: Conjunctivae normal.  Cardiovascular:     Rate and Rhythm: Normal rate and regular rhythm.     Heart sounds: No murmur heard. Pulmonary:     Effort:  Pulmonary effort is normal.     Breath sounds: No stridor. No wheezing, rhonchi or rales.  Abdominal:     General: Abdomen is flat.     Palpations: There is no mass.     Tenderness: There is no abdominal tenderness. There is no guarding.     Hernia: No hernia is present.  Musculoskeletal:        General: Normal range of motion.     Cervical back: Neck supple.     Right lower leg: No edema.     Left lower leg: No edema.  Lymphadenopathy:     Cervical: No cervical adenopathy.  Skin:    General: Skin is warm.  Neurological:     General: No focal deficit present.     Mental Status: She is alert.  Psychiatric:        Mood and Affect: Mood normal.    Lab Results  Component Value Date   WBC 6.7 08/21/2020   HGB 13.1 08/21/2020   HCT 39.3 08/21/2020   PLT 282.0 08/21/2020   GLUCOSE 105 (H) 08/21/2020   CHOL 223 (H) 01/24/2020   TRIG 279.0 (H) 01/24/2020   HDL 55.70 01/24/2020   LDLDIRECT 137.0 01/24/2020   ALT 18 01/24/2020   AST 17 01/24/2020   NA 141 08/21/2020   K 4.3 08/21/2020   CL 102 08/21/2020   CREATININE 1.06 08/21/2020   BUN 26 (H) 08/21/2020   CO2 30 08/21/2020   TSH 0.98 08/21/2020    DG Foot Complete Right  Result Date: 08/14/2020 CLINICAL DATA:  Pain with weight-bearing for week EXAM: RIGHT FOOT COMPLETE - 3+ VIEW COMPARISON:  None. FINDINGS: There is no acute fracture or dislocation. Alignment is normal. The Lisfranc and Chopart joints are intact. The joint spaces are preserved. There is mild Achilles enthesopathy and inferior calcaneal spurring. The soft tissues are unremarkable. IMPRESSION: 1. No acute fracture or dislocation. 2. Mild inferior calcaneal spurring and Achilles enthesopathy. Electronically Signed   By: Valetta Mole MD   On: 08/14/2020 10:46    Assessment & Plan:   Joann Bond was seen today for hypertension.  Diagnoses and all orders for this visit:  Primary hypertension- Her blood pressure today is adequately well controlled but she reports  some SBPs that were too low.  I recommended that she discontinue indapamide and to continue the other 3 antihypertensives.  Osteoporosis, unspecified osteoporosis type, unspecified pathological fracture presence -     Discontinue: Romosozumab-aqqg (EVENITY) 105 MG/1.17ML injection 210 mg  I have discontinued Joann Bond's indapamide. I am also having her maintain her gabapentin, tizanidine, Zoster Vaccine Adjuvanted, SODIUM FLUORIDE (DENTAL RINSE), Romosozumab-aqqg, gabapentin, oxyCODONE-acetaminophen, valsartan, nebivolol, and amLODipine. We will stop administering Romosozumab-aqqg. Additionally, we administered Romosozumab-aqqg.  Meds ordered this encounter  Medications   DISCONTD: Romosozumab-aqqg (EVENITY) 105 MG/1.17ML injection 210 mg    Restricted to Outpatient Use.  Do Not Tube.  Follow-up: Return in about 3 months (around 12/03/2020).  Scarlette Calico, MD

## 2020-09-02 NOTE — Patient Instructions (Addendum)
Hypertension, Adult High blood pressure (hypertension) is when the force of blood pumping through the arteries is too strong. The arteries are the blood vessels that carry blood from the heart throughout the body. Hypertension forces the heart to work harder to pump blood and may cause arteries to become narrow or stiff. Untreated or uncontrolled hypertension can cause a heart attack, heart failure, a stroke, kidney disease, and other problems. A blood pressure reading consists of a higher number over a lower number. Ideally, your blood pressure should be below 120/80. The first ("top") number is called the systolic pressure. It is a measure of the pressure in your arteries as your heart beats. The second ("bottom") number is called the diastolic pressure. It is a measure of the pressure in your arteries as the heart relaxes. What are the causes? The exact cause of this condition is not known. There are some conditions that result in or are related to high blood pressure. What increases the risk? Some risk factors for high blood pressure are under your control. The following factors may make you more likely to develop this condition: Smoking. Having type 2 diabetes mellitus, high cholesterol, or both. Not getting enough exercise or physical activity. Being overweight. Having too much fat, sugar, calories, or salt (sodium) in your diet. Drinking too much alcohol. Some risk factors for high blood pressure may be difficult or impossible to change. Some of these factors include: Having chronic kidney disease. Having a family history of high blood pressure. Age. Risk increases with age. Race. You may be at higher risk if you are African American. Gender. Men are at higher risk than women before age 45. After age 65, women are at higher risk than men. Having obstructive sleep apnea. Stress. What are the signs or symptoms? High blood pressure may not cause symptoms. Very high blood pressure  (hypertensive crisis) may cause: Headache. Anxiety. Shortness of breath. Nosebleed. Nausea and vomiting. Vision changes. Severe chest pain. Seizures. How is this diagnosed? This condition is diagnosed by measuring your blood pressure while you are seated, with your arm resting on a flat surface, your legs uncrossed, and your feet flat on the floor. The cuff of the blood pressure monitor will be placed directly against the skin of your upper arm at the level of your heart. It should be measured at least twice using the same arm. Certain conditions can cause a difference in blood pressure between your right and left arms. Certain factors can cause blood pressure readings to be lower or higher than normal for a short period of time: When your blood pressure is higher when you are in a health care provider's office than when you are at home, this is called white coat hypertension. Most people with this condition do not need medicines. When your blood pressure is higher at home than when you are in a health care provider's office, this is called masked hypertension. Most people with this condition may need medicines to control blood pressure. If you have a high blood pressure reading during one visit or you have normal blood pressure with other risk factors, you may be asked to: Return on a different day to have your blood pressure checked again. Monitor your blood pressure at home for 1 week or longer. If you are diagnosed with hypertension, you may have other blood or imaging tests to help your health care provider understand your overall risk for other conditions. How is this treated? This condition is treated by making   eating healthy foods, exercising more, and reducing your alcohol intake. Your health care provider may prescribe medicine if lifestyle changes are not enough to get your blood pressure under control, and if: Your systolic blood pressure is above 130. Your  diastolic blood pressure is above 80. Your personal target blood pressure may vary depending on your medicalconditions, your age, and other factors. Follow these instructions at home: Eating and drinking  Eat a diet that is high in fiber and potassium, and low in sodium, added sugar, and fat. An example eating plan is called the DASH (Dietary Approaches to Stop Hypertension) diet. To eat this way: Eat plenty of fresh fruits and vegetables. Try to fill one half of your plate at each meal with fruits and vegetables. Eat whole grains, such as whole-wheat pasta, brown rice, or whole-grain bread. Fill about one fourth of your plate with whole grains. Eat or drink low-fat dairy products, such as skim milk or low-fat yogurt. Avoid fatty cuts of meat, processed or cured meats, and poultry with skin. Fill about one fourth of your plate with lean proteins, such as fish, chicken without skin, beans, eggs, or tofu. Avoid pre-made and processed foods. These tend to be higher in sodium, added sugar, and fat. Reduce your daily sodium intake. Most people with hypertension should eat less than 1,500 mg of sodium a day. Do not drink alcohol if: Your health care provider tells you not to drink. You are pregnant, may be pregnant, or are planning to become pregnant. If you drink alcohol: Limit how much you use to: 0-1 drink a day for women. 0-2 drinks a day for men. Be aware of how much alcohol is in your drink. In the [place] one drink equals one 12 oz bottle of beer (355 mL), one 5 oz glass of wine (148 mL), or one 1 oz glass of hard liquor (44 mL).  Lifestyle  Work with your health care provider to maintain a healthy body weight or to lose weight. Ask what an ideal weight is for you. Get at least 30 minutes of exercise most days of the week. Activities may include walking, swimming, or biking. Include exercise to strengthen your muscles (resistance exercise), such as Pilates or lifting weights, as part of your  weekly exercise routine. Try to do these types of exercises for 30 minutes at least 3 days a week. Do not use any products that contain nicotine or tobacco, such as cigarettes, e-cigarettes, and chewing tobacco. If you need help quitting, ask your health care provider. Monitor your blood pressure at home as told by your health care provider. Keep all follow-up visits as told by your health care provider. This is important.  Medicines Take over-the-counter and prescription medicines only as told by your health care provider. Follow directions carefully. Blood pressure medicines must be taken as prescribed. Do not skip doses of blood pressure medicine. Doing this puts you at risk for problems and can make the medicine less effective. Ask your health care provider about side effects or reactions to medicines that you should watch for. Contact a health care provider if you: Think you are having a reaction to a medicine you are taking. Have headaches that keep coming back (recurring). Feel dizzy. Have swelling in your ankles. Have trouble with your vision. Get help right away if you: Develop a severe headache or confusion. Have unusual weakness or numbness. Feel faint. Have severe pain in your chest or abdomen. Vomit repeatedly. Have trouble breathing. Summary Hypertension is  when the force of blood pumping through your arteries is too strong. If this condition is not controlled, it may put you at risk for serious complications. Your personal target blood pressure may vary depending on your medical conditions, your age, and other factors. For most people, a normal blood pressure is less than 120/80. Hypertension is treated with lifestyle changes, medicines, or a combination of both. Lifestyle changes include losing weight, eating a healthy, low-sodium diet, exercising more, and limiting alcohol. This information is not intended to replace advice given to you by your health care provider. Make  sure you discuss any questions you have with your healthcare provider. Document Revised: 09/01/2017 Document Reviewed: 09/01/2017 Elsevier Patient Education  2022 [place]. Hypertension, Adult High blood pressure (hypertension) is when the force of blood pumping through the arteries is too strong. The arteries are the blood vessels that carry blood from the heart throughout the body. Hypertension forces the heart to work harder to pump blood and may cause arteries to become narrow or stiff. Untreated or uncontrolled hypertension can cause a heart attack, heart failure, a stroke, kidney disease, and otherproblems. A blood pressure reading consists of a higher number over a lower number. Ideally, your blood pressure should be below 120/80. The first ("top") number is called the systolic pressure. It is a measure of the pressure in your arteries as your heart beats. The second ("bottom") number is called the diastolic pressure. It is a measure of the pressure in your arteries as theheart relaxes. What are the causes? The exact cause of this condition is not known. There are some conditions thatresult in or are related to high blood pressure. What increases the risk? Some risk factors for high blood pressure are under your control. The following factors may make you more likely to develop this condition: Smoking. Having type 2 diabetes mellitus, high cholesterol, or both. Not getting enough exercise or physical activity. Being overweight. Having too much fat, sugar, calories, or salt (sodium) in your diet. Drinking too much alcohol. Some risk factors for high blood pressure may be difficult or impossible to change. Some of these factors include: Having chronic kidney disease. Having a family history of high blood pressure. Age. Risk increases with age. Race. You may be at higher risk if you are African American. Gender. Men are at higher risk than women before age 30. After age 57, women are at  higher risk than men. Having obstructive sleep apnea. Stress. What are the signs or symptoms? High blood pressure may not cause symptoms. Very high blood pressure (hypertensive crisis) may cause: Headache. Anxiety. Shortness of breath. Nosebleed. Nausea and vomiting. Vision changes. Severe chest pain. Seizures. How is this diagnosed? This condition is diagnosed by measuring your blood pressure while you are seated, with your arm resting on a flat surface, your legs uncrossed, and your feet flat on the floor. The cuff of the blood pressure monitor will be placed directly against the skin of your upper arm at the level of your heart. It should be measured at least twice using the same arm. Certain conditions cancause a difference in blood pressure between your right and left arms. Certain factors can cause blood pressure readings to be lower or higher than normal for a short period of time: When your blood pressure is higher when you are in a health care provider's office than when you are at home, this is called white coat hypertension. Most people with this condition do not need medicines. When  your blood pressure is higher at home than when you are in a health care provider's office, this is called masked hypertension. Most people with this condition may need medicines to control blood pressure. If you have a high blood pressure reading during one visit or you have normal blood pressure with other risk factors, you may be asked to: Return on a different day to have your blood pressure checked again. Monitor your blood pressure at home for 1 week or longer. If you are diagnosed with hypertension, you may have other blood or imaging tests to help your health care provider understand your overall risk for otherconditions. How is this treated? This condition is treated by making healthy lifestyle changes, such as eating healthy foods, exercising more, and reducing your alcohol intake. Your health  care provider may prescribe medicine if lifestyle changes are not enough to get your blood pressure under control, and if: Your systolic blood pressure is above 130. Your diastolic blood pressure is above 80. Your personal target blood pressure may vary depending on your medicalconditions, your age, and other factors. Follow these instructions at home: Eating and drinking  Eat a diet that is high in fiber and potassium, and low in sodium, added sugar, and fat. An example eating plan is called the DASH (Dietary Approaches to Stop Hypertension) diet. To eat this way: Eat plenty of fresh fruits and vegetables. Try to fill one half of your plate at each meal with fruits and vegetables. Eat whole grains, such as whole-wheat pasta, brown rice, or whole-grain bread. Fill about one fourth of your plate with whole grains. Eat or drink low-fat dairy products, such as skim milk or low-fat yogurt. Avoid fatty cuts of meat, processed or cured meats, and poultry with skin. Fill about one fourth of your plate with lean proteins, such as fish, chicken without skin, beans, eggs, or tofu. Avoid pre-made and processed foods. These tend to be higher in sodium, added sugar, and fat. Reduce your daily sodium intake. Most people with hypertension should eat less than 1,500 mg of sodium a day. Do not drink alcohol if: Your health care provider tells you not to drink. You are pregnant, may be pregnant, or are planning to become pregnant. If you drink alcohol: Limit how much you use to: 0-1 drink a day for women. 0-2 drinks a day for men. Be aware of how much alcohol is in your drink. In the [place] one drink equals one 12 oz bottle of beer (355 mL), one 5 oz glass of wine (148 mL), or one 1 oz glass of hard liquor (44 mL).  Lifestyle  Work with your health care provider to maintain a healthy body weight or to lose weight. Ask what an ideal weight is for you. Get at least 30 minutes of exercise most days of the week.  Activities may include walking, swimming, or biking. Include exercise to strengthen your muscles (resistance exercise), such as Pilates or lifting weights, as part of your weekly exercise routine. Try to do these types of exercises for 30 minutes at least 3 days a week. Do not use any products that contain nicotine or tobacco, such as cigarettes, e-cigarettes, and chewing tobacco. If you need help quitting, ask your health care provider. Monitor your blood pressure at home as told by your health care provider. Keep all follow-up visits as told by your health care provider. This is important.  Medicines Take over-the-counter and prescription medicines only as told by your health care provider.  Follow directions carefully. Blood pressure medicines must be taken as prescribed. Do not skip doses of blood pressure medicine. Doing this puts you at risk for problems and can make the medicine less effective. Ask your health care provider about side effects or reactions to medicines that you should watch for. Contact a health care provider if you: Think you are having a reaction to a medicine you are taking. Have headaches that keep coming back (recurring). Feel dizzy. Have swelling in your ankles. Have trouble with your vision. Get help right away if you: Develop a severe headache or confusion. Have unusual weakness or numbness. Feel faint. Have severe pain in your chest or abdomen. Vomit repeatedly. Have trouble breathing. Summary Hypertension is when the force of blood pumping through your arteries is too strong. If this condition is not controlled, it may put you at risk for serious complications. Your personal target blood pressure may vary depending on your medical conditions, your age, and other factors. For most people, a normal blood pressure is less than 120/80. Hypertension is treated with lifestyle changes, medicines, or a combination of both. Lifestyle changes include losing weight,  eating a healthy, low-sodium diet, exercising more, and limiting alcohol. This information is not intended to replace advice given to you by your health care provider. Make sure you discuss any questions you have with your healthcare provider. Document Revised: 09/01/2017 Document Reviewed: 09/01/2017 Elsevier Patient Education  [place].

## 2020-09-12 ENCOUNTER — Other Ambulatory Visit (HOSPITAL_COMMUNITY): Payer: Self-pay

## 2020-09-16 ENCOUNTER — Ambulatory Visit
Admission: RE | Admit: 2020-09-16 | Discharge: 2020-09-16 | Disposition: A | Discharge status: Home / Self Care | Payer: 59 | Source: Ambulatory Visit | Attending: Internal Medicine | Admitting: Internal Medicine

## 2020-09-16 DIAGNOSIS — I10 Essential (primary) hypertension: Secondary | ICD-10-CM

## 2020-09-16 DIAGNOSIS — I150 Renovascular hypertension: Secondary | ICD-10-CM | POA: Diagnosis not present

## 2020-09-16 MED ORDER — GADOBENATE DIMEGLUMINE 529 MG/ML IV SOLN
13.0000 mL | Freq: Once | INTRAVENOUS | Status: AC | PRN
  Administered 2020-09-16: 13 mL via INTRAVENOUS

## 2020-09-17 ENCOUNTER — Other Ambulatory Visit: Payer: Self-pay | Admitting: Internal Medicine

## 2020-09-17 DIAGNOSIS — N281 Cyst of kidney, acquired: Secondary | ICD-10-CM

## 2020-09-17 DIAGNOSIS — N2889 Other specified disorders of kidney and ureter: Secondary | ICD-10-CM | POA: Insufficient documentation

## 2020-09-19 DIAGNOSIS — G894 Chronic pain syndrome: Secondary | ICD-10-CM | POA: Diagnosis not present

## 2020-09-23 ENCOUNTER — Other Ambulatory Visit (HOSPITAL_COMMUNITY): Payer: Self-pay

## 2020-09-23 MED ORDER — OXYCODONE-ACETAMINOPHEN 5-325 MG PO TABS
1.0000 | ORAL_TABLET | Freq: Three times a day (TID) | ORAL | 0 refills | Status: DC | PRN
  Filled 2020-11-27: qty 90, 30d supply, fill #0

## 2020-09-23 MED ORDER — OXYCODONE-ACETAMINOPHEN 5-325 MG PO TABS
1.0000 | ORAL_TABLET | Freq: Three times a day (TID) | ORAL | 0 refills | Status: DC | PRN
  Filled 2020-09-26: qty 90, 30d supply, fill #0

## 2020-09-23 MED ORDER — OXYCODONE-ACETAMINOPHEN 5-325 MG PO TABS
1.0000 | ORAL_TABLET | Freq: Three times a day (TID) | ORAL | 0 refills | Status: DC | PRN
  Filled 2020-10-28: qty 90, 30d supply, fill #0

## 2020-09-25 ENCOUNTER — Other Ambulatory Visit (HOSPITAL_COMMUNITY): Payer: Self-pay

## 2020-09-25 MED FILL — Romosozumab-aqqg Inj Soln Prefilled Syringe 105 MG/1.17ML: SUBCUTANEOUS | 30 days supply | Qty: 2.34 | Fill #5 | Status: AC

## 2020-09-26 ENCOUNTER — Other Ambulatory Visit (HOSPITAL_COMMUNITY): Payer: Self-pay

## 2020-09-30 ENCOUNTER — Other Ambulatory Visit (HOSPITAL_COMMUNITY): Payer: Self-pay

## 2020-10-02 ENCOUNTER — Other Ambulatory Visit: Payer: Self-pay

## 2020-10-02 ENCOUNTER — Ambulatory Visit
Admission: RE | Admit: 2020-10-02 | Discharge: 2020-10-02 | Disposition: A | Discharge status: Home / Self Care | Payer: 59 | Source: Ambulatory Visit | Attending: Internal Medicine | Admitting: Internal Medicine

## 2020-10-02 DIAGNOSIS — N2889 Other specified disorders of kidney and ureter: Secondary | ICD-10-CM

## 2020-10-02 DIAGNOSIS — N281 Cyst of kidney, acquired: Secondary | ICD-10-CM

## 2020-10-02 DIAGNOSIS — Z981 Arthrodesis status: Secondary | ICD-10-CM | POA: Diagnosis not present

## 2020-10-02 MED ORDER — GADOBENATE DIMEGLUMINE 529 MG/ML IV SOLN
13.0000 mL | Freq: Once | INTRAVENOUS | Status: AC | PRN
  Administered 2020-10-02: 13 mL via INTRAVENOUS

## 2020-10-04 ENCOUNTER — Ambulatory Visit (INDEPENDENT_AMBULATORY_CARE_PROVIDER_SITE_OTHER): Payer: 59

## 2020-10-04 ENCOUNTER — Other Ambulatory Visit: Payer: Self-pay

## 2020-10-04 DIAGNOSIS — M8000XD Age-related osteoporosis with current pathological fracture, unspecified site, subsequent encounter for fracture with routine healing: Secondary | ICD-10-CM | POA: Diagnosis not present

## 2020-10-04 MED ORDER — ROMOSOZUMAB-AQQG 105 MG/1.17ML ~~LOC~~ SOSY
210.0000 mg | PREFILLED_SYRINGE | Freq: Once | SUBCUTANEOUS | Status: AC
  Administered 2020-10-04: 210 mg via SUBCUTANEOUS

## 2020-10-04 NOTE — Progress Notes (Signed)
Pt was given Evenity injection w/o any complications. 

## 2020-10-10 ENCOUNTER — Other Ambulatory Visit: Payer: Self-pay | Admitting: Internal Medicine

## 2020-10-10 DIAGNOSIS — I10 Essential (primary) hypertension: Secondary | ICD-10-CM

## 2020-10-10 MED ORDER — AMLODIPINE BESYLATE 5 MG PO TABS
5.0000 mg | ORAL_TABLET | Freq: Every day | ORAL | 0 refills | Status: DC
  Filled 2020-10-10 – 2020-11-04 (×2): qty 90, 90d supply, fill #0

## 2020-10-10 MED ORDER — VALSARTAN 80 MG PO TABS
80.0000 mg | ORAL_TABLET | Freq: Two times a day (BID) | ORAL | 0 refills | Status: DC
  Filled 2020-10-10: qty 180, 90d supply, fill #0

## 2020-10-11 ENCOUNTER — Other Ambulatory Visit (HOSPITAL_COMMUNITY): Payer: Self-pay

## 2020-10-11 ENCOUNTER — Other Ambulatory Visit: Payer: Self-pay | Admitting: Nurse Practitioner

## 2020-10-11 DIAGNOSIS — Z1231 Encounter for screening mammogram for malignant neoplasm of breast: Secondary | ICD-10-CM

## 2020-10-25 ENCOUNTER — Other Ambulatory Visit (HOSPITAL_COMMUNITY): Payer: Self-pay

## 2020-10-25 MED FILL — Romosozumab-aqqg Inj Soln Prefilled Syringe 105 MG/1.17ML: SUBCUTANEOUS | 30 days supply | Qty: 2.34 | Fill #6 | Status: CN

## 2020-10-25 MED FILL — Romosozumab-aqqg Inj Soln Prefilled Syringe 105 MG/1.17ML: SUBCUTANEOUS | 30 days supply | Qty: 2.34 | Fill #6 | Status: AC

## 2020-10-28 ENCOUNTER — Other Ambulatory Visit (HOSPITAL_COMMUNITY): Payer: Self-pay

## 2020-10-28 DIAGNOSIS — H5213 Myopia, bilateral: Secondary | ICD-10-CM | POA: Diagnosis not present

## 2020-10-29 ENCOUNTER — Other Ambulatory Visit (HOSPITAL_COMMUNITY): Payer: Self-pay

## 2020-10-30 ENCOUNTER — Other Ambulatory Visit (HOSPITAL_COMMUNITY): Payer: Self-pay

## 2020-11-04 ENCOUNTER — Other Ambulatory Visit (HOSPITAL_COMMUNITY): Payer: Self-pay

## 2020-11-04 ENCOUNTER — Ambulatory Visit (INDEPENDENT_AMBULATORY_CARE_PROVIDER_SITE_OTHER): Payer: 59

## 2020-11-04 ENCOUNTER — Other Ambulatory Visit: Payer: Self-pay

## 2020-11-04 DIAGNOSIS — M8000XD Age-related osteoporosis with current pathological fracture, unspecified site, subsequent encounter for fracture with routine healing: Secondary | ICD-10-CM | POA: Diagnosis not present

## 2020-11-04 MED ORDER — ROMOSOZUMAB-AQQG 105 MG/1.17ML ~~LOC~~ SOSY
210.0000 mg | PREFILLED_SYRINGE | Freq: Once | SUBCUTANEOUS | Status: AC
  Administered 2020-11-04: 210 mg via SUBCUTANEOUS

## 2020-11-04 NOTE — Progress Notes (Signed)
Pt was given Evenity injection w/o any complications. 

## 2020-11-22 ENCOUNTER — Other Ambulatory Visit (HOSPITAL_COMMUNITY): Payer: Self-pay

## 2020-11-22 MED FILL — Romosozumab-aqqg Inj Soln Prefilled Syringe 105 MG/1.17ML: SUBCUTANEOUS | 30 days supply | Qty: 2.34 | Fill #7 | Status: AC

## 2020-11-25 ENCOUNTER — Other Ambulatory Visit: Payer: Self-pay

## 2020-11-25 ENCOUNTER — Ambulatory Visit
Admission: RE | Admit: 2020-11-25 | Discharge: 2020-11-25 | Disposition: A | Discharge status: Home / Self Care | Payer: 59 | Source: Ambulatory Visit | Attending: Nurse Practitioner | Admitting: Nurse Practitioner

## 2020-11-25 DIAGNOSIS — Z1231 Encounter for screening mammogram for malignant neoplasm of breast: Secondary | ICD-10-CM | POA: Diagnosis not present

## 2020-11-27 ENCOUNTER — Other Ambulatory Visit (HOSPITAL_COMMUNITY): Payer: Self-pay

## 2020-12-02 ENCOUNTER — Other Ambulatory Visit (HOSPITAL_COMMUNITY): Payer: Self-pay

## 2020-12-04 ENCOUNTER — Ambulatory Visit (INDEPENDENT_AMBULATORY_CARE_PROVIDER_SITE_OTHER): Payer: 59 | Admitting: Nurse Practitioner

## 2020-12-04 ENCOUNTER — Other Ambulatory Visit (HOSPITAL_COMMUNITY): Payer: Self-pay

## 2020-12-04 ENCOUNTER — Ambulatory Visit: Payer: 59 | Admitting: Internal Medicine

## 2020-12-04 ENCOUNTER — Encounter: Payer: Self-pay | Admitting: Internal Medicine

## 2020-12-04 ENCOUNTER — Encounter: Payer: Self-pay | Admitting: Nurse Practitioner

## 2020-12-04 ENCOUNTER — Other Ambulatory Visit: Payer: Self-pay

## 2020-12-04 VITALS — BP 126/76 | Ht 62.0 in | Wt 147.0 lb

## 2020-12-04 VITALS — BP 138/84 | HR 69 | Temp 98.3°F | Resp 16 | Ht 62.0 in | Wt 147.0 lb

## 2020-12-04 DIAGNOSIS — M839 Adult osteomalacia, unspecified: Secondary | ICD-10-CM | POA: Diagnosis not present

## 2020-12-04 DIAGNOSIS — I10 Essential (primary) hypertension: Secondary | ICD-10-CM | POA: Diagnosis not present

## 2020-12-04 DIAGNOSIS — M810 Age-related osteoporosis without current pathological fracture: Secondary | ICD-10-CM

## 2020-12-04 DIAGNOSIS — N1831 Chronic kidney disease, stage 3a: Secondary | ICD-10-CM | POA: Diagnosis not present

## 2020-12-04 DIAGNOSIS — Z01419 Encounter for gynecological examination (general) (routine) without abnormal findings: Secondary | ICD-10-CM | POA: Diagnosis not present

## 2020-12-04 DIAGNOSIS — Z780 Asymptomatic menopausal state: Secondary | ICD-10-CM | POA: Diagnosis not present

## 2020-12-04 DIAGNOSIS — T50905A Adverse effect of unspecified drugs, medicaments and biological substances, initial encounter: Secondary | ICD-10-CM | POA: Diagnosis not present

## 2020-12-04 LAB — BASIC METABOLIC PANEL
BUN: 27 mg/dL — ABNORMAL HIGH (ref 6–23)
CO2: 29 mEq/L (ref 19–32)
Calcium: 9.8 mg/dL (ref 8.4–10.5)
Chloride: 106 mEq/L (ref 96–112)
Creatinine, Ser: 0.96 mg/dL (ref 0.40–1.20)
GFR: 66.45 mL/min (ref >60.00)
Glucose, Bld: 99 mg/dL (ref 70–99)
Potassium: 4.3 mEq/L (ref 3.5–5.1)
Sodium: 141 mEq/L (ref 135–145)

## 2020-12-04 LAB — VITAMIN D 25 HYDROXY (VIT D DEFICIENCY, FRACTURES): VITD: 28.76 ng/mL — ABNORMAL LOW (ref 30.00–100.00)

## 2020-12-04 MED ORDER — ROMOSOZUMAB-AQQG 105 MG/1.17ML ~~LOC~~ SOSY
210.0000 mg | PREFILLED_SYRINGE | Freq: Once | SUBCUTANEOUS | Status: AC
  Administered 2020-12-04: 210 mg via SUBCUTANEOUS

## 2020-12-04 MED ORDER — AMLODIPINE BESYLATE 5 MG PO TABS
5.0000 mg | ORAL_TABLET | Freq: Every day | ORAL | 1 refills | Status: DC
  Filled 2020-12-04 – 2021-02-10 (×2): qty 90, 90d supply, fill #0

## 2020-12-04 MED ORDER — VALSARTAN 80 MG PO TABS
80.0000 mg | ORAL_TABLET | Freq: Two times a day (BID) | ORAL | 1 refills | Status: DC
  Filled 2020-12-04: qty 180, 90d supply, fill #0
  Filled 2021-02-10: qty 60, 30d supply, fill #0
  Filled 2021-02-10: qty 180, 90d supply, fill #0
  Filled 2021-03-11: qty 60, 30d supply, fill #1

## 2020-12-04 NOTE — Patient Instructions (Signed)

## 2020-12-04 NOTE — Progress Notes (Signed)
   Joann Bond 1965/05/26 [id number]   History:  55 y.o. G0 presents to establish care. Postmenopausal - no HRT, no bleeding. Abnormal pap 3-4 years ago - positive HPV, otherwise normal pap history. HTN and osteoporosis managed by PCP. Normal mammogram history.  Gynecologic History No LMP recorded. Patient is postmenopausal.   Sexually active: No  Health maintenance Last Pap: 10/25/2019. Results were: Normal, 3-year repeat Last mammogram: 11/25/2020. Results were: Normal Last colonoscopy: 01/23/2015. Results were: Normal Last Dexa: 04/2019. Results were: T-score -2.9  Past medical history, past surgical history, family history and social history were all reviewed and documented in the EPIC chart. 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 Mother diagnosed with breast cancer at age 55, BRCA negative.  ROS:  A ROS was performed and pertinent positives and negatives are included.  Exam:  Vitals:   12/04/20 1058  BP: 126/76  Weight: 147 lb (66.7 kg)  Height: $Remove'5\' 2"'ZxBelJs$  (1.575 m)    Body mass index is 26.89 kg/m.  General appearance:  Normal Thyroid:  Symmetrical, normal in size, without palpable masses or nodularity. Respiratory  Auscultation:  Clear without wheezing or rhonchi Cardiovascular  Auscultation:  Regular rate, without rubs, murmurs or gallops  Edema/varicosities:  Not grossly evident Abdominal  Soft,nontender, without masses, guarding or rebound.  Liver/spleen:  No organomegaly noted  Hernia:  None appreciated  Skin  Inspection:  Grossly normal   Breasts: Examined lying and sitting.   Right: Without masses, retractions, discharge or axillary adenopathy.   Left: Without masses, retractions, discharge or axillary adenopathy. Genitourinary   Inguinal/mons:  Normal without inguinal adenopathy  External genitalia:  Normal appearing vulva with no masses, tenderness, or lesions  BUS/Urethra/Skene's glands:  Normal  Vagina:  Normal appearing with normal color and discharge, no  lesions  Cervix:  Normal appearing without discharge or lesions  Uterus:  Normal in size, shape and contour.  Midline and mobile, nontender  Adnexa/parametria:     Rt: Normal in size, without masses or tenderness.   Lt: Normal in size, without masses or tenderness.  Anus and perineum: Normal  Digital rectal exam: Normal sphincter tone without palpated masses or tenderness  Patient informed chaperone available to be present for breast and pelvic exam. Patient has requested no chaperone to be present. Patient has been advised what will be completed during breast and pelvic exam.    Assessment/Plan:  55 y.o. G0 for annual exam.   Well female exam with routine gynecological exam - Education provided on SBEs, importance of preventative screenings, current guidelines, high calcium diet, regular exercise, and multivitamin daily. Labs with PCP.   Postmenopausal - no HRT, no bleeding.   Age-related osteoporosis without current pathological fracture - T-score -2.9. Evenity administered through PCP.   Screening for cervical cancer - Positive HR HPV 3-4 years ago, otherwise normal pap history. 2021 pap normal. Will repeat at 5-year interval per guidelines.  Screening for breast cancer - Normal mammogram history.  Continue annual screenings.  Normal breast exam today. Mother with history of breast cancer at age 55, BRCA negative.   Screening for colon cancer - 2017 colonoscopy. Will repeat at GI's recommended interval.   Follow-up in 1 year for annual.     Tamela Gammon [facility], 11:20 AM 12/04/2020

## 2020-12-04 NOTE — Progress Notes (Signed)
Subjective:  Patient ID: Joann Bond, female    DOB: 02-08-1965  Age: 55 y.o. MRN: [id number]  CC: Hypertension  This visit occurred during the SARS-CoV-2 public health emergency.  Safety protocols were in place, including screening questions prior to the visit, additional usage of staff PPE, and extensive cleaning of exam room while observing appropriate contact time as indicated for disinfecting solutions.    HPI Joann Bond presents for f/up -  She tells me her blood pressure is well controlled.  She is active and denies chest pain, shortness of breath, dizziness, lightheadedness, diaphoresis, or palpitations.  Outpatient Medications Prior to Visit  Medication Sig Dispense Refill   gabapentin (NEURONTIN) 300 MG capsule Take 300 mg by mouth 3 (three) times daily.     gabapentin (NEURONTIN) 300 MG capsule Take 1 capsule (300 mg total) by mouth 3 (three) times daily. 270 capsule 0   oxyCODONE-acetaminophen (PERCOCET/ROXICET) 5-325 MG tablet Take 1 tablet by mouth every 8 (eight) hours as needed for pain for up to 30 days. 90 tablet 0   oxyCODONE-acetaminophen (PERCOCET/ROXICET) 5-325 MG tablet Take 1 tablet by mouth every 8 (eight) hours as needed for pain. 11/25/20 90 tablet 0   Romosozumab-aqqg (EVENITY) 105 MG/1.17ML SOSY injection INJECT 210 MG INTO THE SKIN EVERY 30 DAYS. 2.34 mL 11   tizanidine (ZANAFLEX) 2 MG capsule Take 2 mg by mouth 3 (three) times daily.     Zoster Vaccine Adjuvanted [facility]) injection TO BE ADMINISTERED BY PHARMACIST. 1 each 1   amLODipine (NORVASC) 5 MG tablet Take 1 tablet (5 mg total) by mouth daily. 90 tablet 0   nebivolol (BYSTOLIC) 5 MG tablet Take 1 tablet (5 mg total) by mouth daily. 30 tablet 3   oxyCODONE-acetaminophen (PERCOCET/ROXICET) 5-325 MG tablet Take 1 tablet by mouth every 8 (eight) hours as needed for pain. 90 tablet 0   oxyCODONE-acetaminophen (PERCOCET/ROXICET) 5-325 MG tablet Take 1 tablet by mouth every 8 (eight) hours as needed for  pain. 10/27/20 90 tablet 0   valsartan (DIOVAN) 80 MG tablet Take 1 tablet (80 mg total) by mouth 2 (two) times daily. 180 tablet 0   No facility-administered medications prior to visit.    ROS Review of Systems  Constitutional:  Negative for diaphoresis and fatigue.  HENT: Negative.    Eyes: Negative.   Respiratory:  Negative for cough, chest tightness, shortness of breath and wheezing.   Cardiovascular:  Negative for chest pain, palpitations and leg swelling.  Gastrointestinal:  Negative for abdominal pain, constipation, diarrhea and vomiting.  Endocrine: Negative.   Genitourinary: Negative.  Negative for difficulty urinating.  Musculoskeletal:  Negative for arthralgias.  Skin: Negative.   Neurological:  Negative for dizziness, weakness, light-headedness and headaches.  Hematological:  Negative for adenopathy. Does not bruise/bleed easily.  Psychiatric/Behavioral: Negative.     Objective:  BP 138/84 (BP Location: Right Arm, Patient Position: Sitting, Cuff Size: Large)   Pulse 69   Temp 98.3 F (36.8 C) (Oral)   Resp 16   Ht 5\' 2"  (1.575 m)   Wt 147 lb (66.7 kg)   SpO2 98%   BMI 26.89 kg/m   BP Readings from Last 3 Encounters:  12/04/20 138/84  12/04/20 126/76  09/02/20 138/78    Wt Readings from Last 3 Encounters:  12/04/20 147 lb (66.7 kg)  12/04/20 147 lb (66.7 kg)  09/02/20 141 lb (64 kg)    Physical Exam Vitals reviewed.  HENT:     Nose: Nose normal.  Mouth/Throat:     Mouth: Mucous membranes are moist.  Eyes:     General: No scleral icterus.    Conjunctiva/sclera: Conjunctivae normal.  Cardiovascular:     Rate and Rhythm: Normal rate and regular rhythm.     Heart sounds: No murmur heard. Pulmonary:     Effort: Pulmonary effort is normal.     Breath sounds: No stridor. No wheezing, rhonchi or rales.  Abdominal:     General: Abdomen is flat.     Palpations: There is no mass.     Tenderness: There is no abdominal tenderness. There is no guarding.      Hernia: No hernia is present.  Musculoskeletal:        General: Normal range of motion.     Cervical back: Neck supple.     Right lower leg: No edema.     Left lower leg: No edema.  Lymphadenopathy:     Cervical: No cervical adenopathy.  Skin:    General: Skin is warm and dry.  Neurological:     General: No focal deficit present.     Mental Status: She is alert.  Psychiatric:        Mood and Affect: Mood normal.        Behavior: Behavior normal.    Lab Results  Component Value Date   WBC 6.7 08/21/2020   HGB 13.1 08/21/2020   HCT 39.3 08/21/2020   PLT 282.0 08/21/2020   GLUCOSE 99 12/04/2020   CHOL 223 (H) 01/24/2020   TRIG 279.0 (H) 01/24/2020   HDL 55.70 01/24/2020   LDLDIRECT 137.0 01/24/2020   ALT 18 01/24/2020   AST 17 01/24/2020   NA 141 12/04/2020   K 4.3 12/04/2020   CL 106 12/04/2020   CREATININE 0.96 12/04/2020   BUN 27 (H) 12/04/2020   CO2 29 12/04/2020   TSH 0.98 08/21/2020    MM 3D SCREEN BREAST BILATERAL  Result Date: 11/25/2020 CLINICAL DATA:  Screening. EXAM: DIGITAL SCREENING BILATERAL MAMMOGRAM WITH TOMOSYNTHESIS AND CAD TECHNIQUE: Bilateral screening digital craniocaudal and mediolateral oblique mammograms were obtained. Bilateral screening digital breast tomosynthesis was performed. The images were evaluated with computer-aided detection. COMPARISON:  Previous exam(s). ACR Breast Density Category c: The breast tissue is heterogeneously dense, which may obscure small masses. FINDINGS: There are no findings suspicious for malignancy. IMPRESSION: No mammographic evidence of malignancy. A result letter of this screening mammogram will be mailed directly to the patient. RECOMMENDATION: Screening mammogram in one year. (Code:SM-B-01Y) BI-RADS CATEGORY  1: Negative. Electronically Signed   By: Lillia Mountain M.D.   On: 11/25/2020 11:45    Assessment & Plan:   Joann Bond was seen today for hypertension.  Diagnoses and all orders for this visit:  Primary  hypertension- Her blood pressure is adequately well controlled. -     Basic metabolic panel; Future -     VITAMIN D 25 Hydroxy (Vit-D Deficiency, Fractures); Future -     amLODipine (NORVASC) 5 MG tablet; Take 1 tablet (5 mg total) by mouth daily. -     valsartan (DIOVAN) 80 MG tablet; Take 1 tablet (80 mg total) by mouth 2 (two) times daily. -     VITAMIN D 25 Hydroxy (Vit-D Deficiency, Fractures) -     Basic metabolic panel  Stage 3a chronic kidney disease ([place])- Her renal function has improved.  She will avoid nephrotoxic agents. 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 -     Basic metabolic panel; Future -     VITAMIN D 25 Hydroxy (Vit-D Deficiency, Fractures); Future -     PTH, intact and calcium; Future -     PTH, intact and calcium -     VITAMIN D 25 Hydroxy (Vit-D Deficiency, Fractures) -     Basic metabolic panel  Osteoporosis, unspecified osteoporosis type, unspecified pathological fracture presence -     Romosozumab-aqqg (EVENITY) 105 MG/1.17ML injection 210 mg  I am having Joann Bond maintain her gabapentin, tizanidine, Zoster Vaccine Adjuvanted, Romosozumab-aqqg, gabapentin, oxyCODONE-acetaminophen, oxyCODONE-acetaminophen, amLODipine, and valsartan. We administered Romosozumab-aqqg.  Meds ordered this encounter  Medications   amLODipine (NORVASC) 5 MG tablet    Sig: Take 1 tablet (5 mg total) by mouth daily.    Dispense:  90 tablet    Refill:  1   valsartan (DIOVAN) 80 MG tablet    Sig: Take 1 tablet (80 mg total) by mouth 2 (two) times daily.    Dispense:  180 tablet    Refill:  1   Romosozumab-aqqg (EVENITY) 105 MG/1.17ML injection 210 mg    Restricted to Outpatient Use.  Do Not Tube.      Follow-up: Return in about 6 months (around 06/03/2021).  Scarlette Calico, MD

## 2020-12-05 ENCOUNTER — Other Ambulatory Visit: Payer: Self-pay | Admitting: Internal Medicine

## 2020-12-05 ENCOUNTER — Other Ambulatory Visit (HOSPITAL_COMMUNITY): Payer: Self-pay

## 2020-12-05 LAB — PTH, INTACT AND CALCIUM
Calcium: 10 mg/dL (ref 8.6–10.4)
PTH: 67 pg/mL (ref 16–77)

## 2020-12-05 MED ORDER — NEBIVOLOL HCL 5 MG PO TABS
5.0000 mg | ORAL_TABLET | Freq: Every day | ORAL | 3 refills | Status: DC
  Filled 2020-12-05: qty 30, 30d supply, fill #0
  Filled 2021-01-02: qty 30, 30d supply, fill #1
  Filled 2021-02-10: qty 30, 30d supply, fill #2
  Filled 2021-03-19: qty 30, 30d supply, fill #3

## 2020-12-06 ENCOUNTER — Other Ambulatory Visit (HOSPITAL_COMMUNITY): Payer: Self-pay

## 2020-12-06 DIAGNOSIS — M810 Age-related osteoporosis without current pathological fracture: Secondary | ICD-10-CM | POA: Insufficient documentation

## 2020-12-06 MED ORDER — CHOLECALCIFEROL 50 MCG (2000 UT) PO TABS
1.0000 | ORAL_TABLET | Freq: Every day | ORAL | 1 refills | Status: DC
  Filled 2020-12-06: qty 90, 90d supply, fill #0

## 2020-12-06 NOTE — Addendum Note (Signed)
Addended by: Janith Lima on: 12/06/2020 10:51 AM   Modules accepted: Orders

## 2020-12-12 ENCOUNTER — Other Ambulatory Visit (HOSPITAL_COMMUNITY): Payer: Self-pay

## 2020-12-12 DIAGNOSIS — G894 Chronic pain syndrome: Secondary | ICD-10-CM | POA: Diagnosis not present

## 2020-12-12 DIAGNOSIS — M47814 Spondylosis without myelopathy or radiculopathy, thoracic region: Secondary | ICD-10-CM | POA: Diagnosis not present

## 2020-12-12 DIAGNOSIS — M5136 Other intervertebral disc degeneration, lumbar region: Secondary | ICD-10-CM | POA: Diagnosis not present

## 2020-12-12 MED ORDER — TIZANIDINE HCL 2 MG PO CAPS
2.0000 mg | ORAL_CAPSULE | Freq: Three times a day (TID) | ORAL | 0 refills | Status: DC
  Filled 2020-12-12: qty 270, 90d supply, fill #0

## 2020-12-12 MED ORDER — GABAPENTIN 300 MG PO CAPS
300.0000 mg | ORAL_CAPSULE | Freq: Three times a day (TID) | ORAL | 0 refills | Status: DC
  Filled 2020-12-12 – 2021-01-02 (×2): qty 270, 90d supply, fill #0

## 2020-12-16 ENCOUNTER — Other Ambulatory Visit (HOSPITAL_COMMUNITY): Payer: Self-pay

## 2020-12-16 MED ORDER — OXYCODONE-ACETAMINOPHEN 5-325 MG PO TABS
1.0000 | ORAL_TABLET | Freq: Three times a day (TID) | ORAL | 0 refills | Status: DC | PRN
  Filled 2021-01-27: qty 90, 30d supply, fill #0

## 2020-12-16 MED ORDER — OXYCODONE-ACETAMINOPHEN 5-325 MG PO TABS
1.0000 | ORAL_TABLET | Freq: Three times a day (TID) | ORAL | 0 refills | Status: DC | PRN
  Filled 2020-12-27: qty 90, 30d supply, fill #0

## 2020-12-16 MED ORDER — OXYCODONE-ACETAMINOPHEN 5-325 MG PO TABS
1.0000 | ORAL_TABLET | Freq: Three times a day (TID) | ORAL | 0 refills | Status: DC | PRN
  Filled 2021-02-27: qty 90, 30d supply, fill #0

## 2020-12-23 ENCOUNTER — Other Ambulatory Visit (HOSPITAL_COMMUNITY): Payer: Self-pay

## 2020-12-23 MED FILL — Romosozumab-aqqg Inj Soln Prefilled Syringe 105 MG/1.17ML: SUBCUTANEOUS | 30 days supply | Qty: 2.34 | Fill #8 | Status: AC

## 2020-12-24 ENCOUNTER — Other Ambulatory Visit (HOSPITAL_COMMUNITY): Payer: Self-pay

## 2020-12-25 ENCOUNTER — Other Ambulatory Visit (HOSPITAL_COMMUNITY): Payer: Self-pay

## 2020-12-26 ENCOUNTER — Other Ambulatory Visit (HOSPITAL_COMMUNITY): Payer: Self-pay

## 2020-12-27 ENCOUNTER — Other Ambulatory Visit (HOSPITAL_COMMUNITY): Payer: Self-pay

## 2021-01-02 ENCOUNTER — Other Ambulatory Visit (HOSPITAL_COMMUNITY): Payer: Self-pay

## 2021-01-03 ENCOUNTER — Ambulatory Visit (INDEPENDENT_AMBULATORY_CARE_PROVIDER_SITE_OTHER): Payer: 59

## 2021-01-03 ENCOUNTER — Other Ambulatory Visit: Payer: Self-pay

## 2021-01-03 DIAGNOSIS — M810 Age-related osteoporosis without current pathological fracture: Secondary | ICD-10-CM

## 2021-01-03 MED ORDER — ROMOSOZUMAB-AQQG 105 MG/1.17ML ~~LOC~~ SOSY
210.0000 mg | PREFILLED_SYRINGE | Freq: Once | SUBCUTANEOUS | Status: AC
  Administered 2021-01-03: 11:00:00 210 mg via SUBCUTANEOUS

## 2021-01-03 NOTE — Progress Notes (Signed)
Pt here for Evenity injection per Dr. Ronnald Ramp  Given  and pt tolerated injection well.

## 2021-01-09 DIAGNOSIS — R2689 Other abnormalities of gait and mobility: Secondary | ICD-10-CM | POA: Diagnosis not present

## 2021-01-09 DIAGNOSIS — R293 Abnormal posture: Secondary | ICD-10-CM | POA: Diagnosis not present

## 2021-01-09 DIAGNOSIS — M5386 Other specified dorsopathies, lumbar region: Secondary | ICD-10-CM | POA: Diagnosis not present

## 2021-01-09 DIAGNOSIS — M5136 Other intervertebral disc degeneration, lumbar region: Secondary | ICD-10-CM | POA: Diagnosis not present

## 2021-01-09 DIAGNOSIS — M6281 Muscle weakness (generalized): Secondary | ICD-10-CM | POA: Diagnosis not present

## 2021-01-09 DIAGNOSIS — M47814 Spondylosis without myelopathy or radiculopathy, thoracic region: Secondary | ICD-10-CM | POA: Diagnosis not present

## 2021-01-13 DIAGNOSIS — M5136 Other intervertebral disc degeneration, lumbar region: Secondary | ICD-10-CM | POA: Diagnosis not present

## 2021-01-13 DIAGNOSIS — M47814 Spondylosis without myelopathy or radiculopathy, thoracic region: Secondary | ICD-10-CM | POA: Diagnosis not present

## 2021-01-13 DIAGNOSIS — R262 Difficulty in walking, not elsewhere classified: Secondary | ICD-10-CM | POA: Diagnosis not present

## 2021-01-13 DIAGNOSIS — R2689 Other abnormalities of gait and mobility: Secondary | ICD-10-CM | POA: Diagnosis not present

## 2021-01-13 DIAGNOSIS — R29898 Other symptoms and signs involving the musculoskeletal system: Secondary | ICD-10-CM | POA: Diagnosis not present

## 2021-01-13 DIAGNOSIS — M545 Low back pain, unspecified: Secondary | ICD-10-CM | POA: Diagnosis not present

## 2021-01-15 DIAGNOSIS — M5136 Other intervertebral disc degeneration, lumbar region: Secondary | ICD-10-CM | POA: Diagnosis not present

## 2021-01-15 DIAGNOSIS — M545 Low back pain, unspecified: Secondary | ICD-10-CM | POA: Diagnosis not present

## 2021-01-15 DIAGNOSIS — R2689 Other abnormalities of gait and mobility: Secondary | ICD-10-CM | POA: Diagnosis not present

## 2021-01-15 DIAGNOSIS — R29898 Other symptoms and signs involving the musculoskeletal system: Secondary | ICD-10-CM | POA: Diagnosis not present

## 2021-01-15 DIAGNOSIS — M47814 Spondylosis without myelopathy or radiculopathy, thoracic region: Secondary | ICD-10-CM | POA: Diagnosis not present

## 2021-01-15 DIAGNOSIS — R262 Difficulty in walking, not elsewhere classified: Secondary | ICD-10-CM | POA: Diagnosis not present

## 2021-01-20 DIAGNOSIS — M5136 Other intervertebral disc degeneration, lumbar region: Secondary | ICD-10-CM | POA: Diagnosis not present

## 2021-01-20 DIAGNOSIS — R2689 Other abnormalities of gait and mobility: Secondary | ICD-10-CM | POA: Diagnosis not present

## 2021-01-20 DIAGNOSIS — R29898 Other symptoms and signs involving the musculoskeletal system: Secondary | ICD-10-CM | POA: Diagnosis not present

## 2021-01-20 DIAGNOSIS — M47814 Spondylosis without myelopathy or radiculopathy, thoracic region: Secondary | ICD-10-CM | POA: Diagnosis not present

## 2021-01-20 DIAGNOSIS — M545 Low back pain, unspecified: Secondary | ICD-10-CM | POA: Diagnosis not present

## 2021-01-20 DIAGNOSIS — R262 Difficulty in walking, not elsewhere classified: Secondary | ICD-10-CM | POA: Diagnosis not present

## 2021-01-22 DIAGNOSIS — M47814 Spondylosis without myelopathy or radiculopathy, thoracic region: Secondary | ICD-10-CM | POA: Diagnosis not present

## 2021-01-22 DIAGNOSIS — R2689 Other abnormalities of gait and mobility: Secondary | ICD-10-CM | POA: Diagnosis not present

## 2021-01-22 DIAGNOSIS — R262 Difficulty in walking, not elsewhere classified: Secondary | ICD-10-CM | POA: Diagnosis not present

## 2021-01-22 DIAGNOSIS — M545 Low back pain, unspecified: Secondary | ICD-10-CM | POA: Diagnosis not present

## 2021-01-22 DIAGNOSIS — M5136 Other intervertebral disc degeneration, lumbar region: Secondary | ICD-10-CM | POA: Diagnosis not present

## 2021-01-22 DIAGNOSIS — R29898 Other symptoms and signs involving the musculoskeletal system: Secondary | ICD-10-CM | POA: Diagnosis not present

## 2021-01-24 ENCOUNTER — Other Ambulatory Visit (HOSPITAL_COMMUNITY): Payer: Self-pay

## 2021-01-24 MED FILL — Romosozumab-aqqg Inj Soln Prefilled Syringe 105 MG/1.17ML: SUBCUTANEOUS | 30 days supply | Qty: 2.34 | Fill #9 | Status: AC

## 2021-01-27 ENCOUNTER — Other Ambulatory Visit (HOSPITAL_COMMUNITY): Payer: Self-pay

## 2021-01-27 DIAGNOSIS — R2689 Other abnormalities of gait and mobility: Secondary | ICD-10-CM | POA: Diagnosis not present

## 2021-01-27 DIAGNOSIS — M47814 Spondylosis without myelopathy or radiculopathy, thoracic region: Secondary | ICD-10-CM | POA: Diagnosis not present

## 2021-01-27 DIAGNOSIS — R262 Difficulty in walking, not elsewhere classified: Secondary | ICD-10-CM | POA: Diagnosis not present

## 2021-01-27 DIAGNOSIS — R29898 Other symptoms and signs involving the musculoskeletal system: Secondary | ICD-10-CM | POA: Diagnosis not present

## 2021-01-27 DIAGNOSIS — M545 Low back pain, unspecified: Secondary | ICD-10-CM | POA: Diagnosis not present

## 2021-01-27 DIAGNOSIS — M5136 Other intervertebral disc degeneration, lumbar region: Secondary | ICD-10-CM | POA: Diagnosis not present

## 2021-01-28 ENCOUNTER — Other Ambulatory Visit (HOSPITAL_COMMUNITY): Payer: Self-pay

## 2021-01-29 DIAGNOSIS — M545 Low back pain, unspecified: Secondary | ICD-10-CM | POA: Diagnosis not present

## 2021-01-29 DIAGNOSIS — R29898 Other symptoms and signs involving the musculoskeletal system: Secondary | ICD-10-CM | POA: Diagnosis not present

## 2021-01-29 DIAGNOSIS — M5136 Other intervertebral disc degeneration, lumbar region: Secondary | ICD-10-CM | POA: Diagnosis not present

## 2021-01-29 DIAGNOSIS — M47814 Spondylosis without myelopathy or radiculopathy, thoracic region: Secondary | ICD-10-CM | POA: Diagnosis not present

## 2021-01-29 DIAGNOSIS — R2689 Other abnormalities of gait and mobility: Secondary | ICD-10-CM | POA: Diagnosis not present

## 2021-01-29 DIAGNOSIS — R262 Difficulty in walking, not elsewhere classified: Secondary | ICD-10-CM | POA: Diagnosis not present

## 2021-01-31 ENCOUNTER — Ambulatory Visit (INDEPENDENT_AMBULATORY_CARE_PROVIDER_SITE_OTHER): Payer: 59 | Admitting: *Deleted

## 2021-01-31 ENCOUNTER — Other Ambulatory Visit: Payer: Self-pay

## 2021-01-31 DIAGNOSIS — M810 Age-related osteoporosis without current pathological fracture: Secondary | ICD-10-CM | POA: Diagnosis not present

## 2021-01-31 MED ORDER — ROMOSOZUMAB-AQQG 105 MG/1.17ML ~~LOC~~ SOSY
210.0000 mg | PREFILLED_SYRINGE | Freq: Once | SUBCUTANEOUS | Status: AC
  Administered 2021-01-31: 210 mg via SUBCUTANEOUS

## 2021-01-31 NOTE — Progress Notes (Signed)
Pls cosign for Evenity inj../lmb  

## 2021-02-10 ENCOUNTER — Other Ambulatory Visit (HOSPITAL_COMMUNITY): Payer: Self-pay

## 2021-02-18 ENCOUNTER — Other Ambulatory Visit (HOSPITAL_COMMUNITY): Payer: Self-pay

## 2021-02-18 ENCOUNTER — Other Ambulatory Visit: Payer: Self-pay | Admitting: Internal Medicine

## 2021-02-18 DIAGNOSIS — M8000XD Age-related osteoporosis with current pathological fracture, unspecified site, subsequent encounter for fracture with routine healing: Secondary | ICD-10-CM

## 2021-02-18 MED ORDER — EVENITY 105 MG/1.17ML ~~LOC~~ SOSY
PREFILLED_SYRINGE | SUBCUTANEOUS | 0 refills | Status: DC
  Filled 2021-02-28: qty 2.34, 30d supply, fill #0

## 2021-02-24 ENCOUNTER — Ambulatory Visit: Payer: 59 | Attending: Internal Medicine | Admitting: Pharmacist

## 2021-02-24 ENCOUNTER — Other Ambulatory Visit: Payer: Self-pay

## 2021-02-24 DIAGNOSIS — Z79899 Other long term (current) drug therapy: Secondary | ICD-10-CM

## 2021-02-24 NOTE — Progress Notes (Signed)
° °  S: Patient presents today for review of their specialty medication.   Patient is currently taking Evenity for osteoporosis. Patient is managed by Dr. Ronnald Ramp for this. She has an appointment next month to discuss continued therapy.   Dosing: 105 mg qmonth  Adherence: confirmed   Efficacy: will not know until meeting with Dr. Ronnald Ramp next month.   Monitoring:  -S/Sx of injection site rxn: none -S/Sx of MACE: none -S/Sx of fracture/ONJ: none -Serum calcium: monitored, WNL in 11/2020 -BMD: baseline completed in 2021.   Current adverse effects: - GI upset: none - Arthralgia: none - HA: none - Other: endorses BL foot pain, described as a burning pain that happens after injections.   O:   Lab Results  Component Value Date   WBC 6.7 08/21/2020   HGB 13.1 08/21/2020   HCT 39.3 08/21/2020   MCV 88.7 08/21/2020   PLT 282.0 08/21/2020      Chemistry      Component Value Date/Time   NA 141 12/04/2020 1504   K 4.3 12/04/2020 1504   CL 106 12/04/2020 1504   CO2 29 12/04/2020 1504   BUN 27 (H) 12/04/2020 1504   CREATININE 0.96 12/04/2020 1504      Component Value Date/Time   CALCIUM 10.0 12/04/2020 1504   CALCIUM 9.8 12/04/2020 1504   ALKPHOS 76 01/24/2020 1114   AST 17 01/24/2020 1114   ALT 18 01/24/2020 1114   BILITOT 0.6 01/24/2020 1114       A/P: 1. Medication review: patient currently on Evenity for osteoporosis. Reviewed the medication including the following: romosozumab inhibits sclerostin, regulating bone growth. Romosozumab increases bone formation and to a lesser extent, decreases bone resorption. It is a monthly dose consisting of 2 consecutive subQ injections. These should be allowed to sit at room temperature for at least 30 minutes prior to administration. The injections should be given in the abdomen, thigh, or outer area of the upper arm by a health care professional. Sites should be rotated and injections should not be given in broken or damaged skin. The  solution should appear clear to light yellow and should be discarded if cloudy or discolored. Possible adverse effects include injection site reaction, joint pain, HA, peripheral edema. More serious but rare reactions include MACE events, bone fracture, hypocalcemia, and/or ONJ. It does carry a boxed warning for increase risk of MI, stroke and CV death and should be avoided in patients with MACE events within the previous year. All other questions and concerns were addressed with the patient. No recommendations for any changes at this time.   Benard Halsted, PharmD, [facility] [place], [place] [phone]

## 2021-02-27 ENCOUNTER — Other Ambulatory Visit (HOSPITAL_COMMUNITY): Payer: Self-pay

## 2021-02-28 ENCOUNTER — Other Ambulatory Visit (HOSPITAL_COMMUNITY): Payer: Self-pay

## 2021-03-03 ENCOUNTER — Other Ambulatory Visit (HOSPITAL_COMMUNITY): Payer: Self-pay

## 2021-03-04 ENCOUNTER — Other Ambulatory Visit (HOSPITAL_COMMUNITY): Payer: Self-pay

## 2021-03-05 ENCOUNTER — Other Ambulatory Visit (HOSPITAL_COMMUNITY): Payer: Self-pay

## 2021-03-07 ENCOUNTER — Ambulatory Visit (INDEPENDENT_AMBULATORY_CARE_PROVIDER_SITE_OTHER): Payer: 59

## 2021-03-07 ENCOUNTER — Other Ambulatory Visit: Payer: Self-pay

## 2021-03-07 DIAGNOSIS — M810 Age-related osteoporosis without current pathological fracture: Secondary | ICD-10-CM

## 2021-03-07 MED ORDER — ROMOSOZUMAB-AQQG 105 MG/1.17ML ~~LOC~~ SOSY
210.0000 mg | PREFILLED_SYRINGE | Freq: Once | SUBCUTANEOUS | Status: AC
  Administered 2021-03-07: 210 mg via SUBCUTANEOUS

## 2021-03-07 NOTE — Progress Notes (Signed)
Pt came in for Evenity injections. Injections were given in left arm, pt tolerated well. ?

## 2021-03-11 ENCOUNTER — Other Ambulatory Visit (HOSPITAL_COMMUNITY): Payer: Self-pay

## 2021-03-11 DIAGNOSIS — M41115 Juvenile idiopathic scoliosis, thoracolumbar region: Secondary | ICD-10-CM | POA: Diagnosis not present

## 2021-03-12 ENCOUNTER — Other Ambulatory Visit (HOSPITAL_COMMUNITY): Payer: Self-pay

## 2021-03-13 ENCOUNTER — Other Ambulatory Visit (HOSPITAL_COMMUNITY): Payer: Self-pay

## 2021-03-13 MED ORDER — OXYCODONE-ACETAMINOPHEN 5-325 MG PO TABS
1.0000 | ORAL_TABLET | Freq: Three times a day (TID) | ORAL | 0 refills | Status: DC | PRN
  Filled 2021-05-01: qty 90, 30d supply, fill #0

## 2021-03-13 MED ORDER — OXYCODONE-ACETAMINOPHEN 5-325 MG PO TABS
1.0000 | ORAL_TABLET | Freq: Three times a day (TID) | ORAL | 0 refills | Status: DC | PRN
  Filled 2021-06-03: qty 90, 30d supply, fill #0

## 2021-03-13 MED ORDER — OXYCODONE-ACETAMINOPHEN 5-325 MG PO TABS
1.0000 | ORAL_TABLET | Freq: Three times a day (TID) | ORAL | 0 refills | Status: DC | PRN
  Filled 2021-03-31: qty 90, 30d supply, fill #0

## 2021-03-18 ENCOUNTER — Ambulatory Visit: Payer: 59 | Admitting: Internal Medicine

## 2021-03-19 ENCOUNTER — Other Ambulatory Visit (HOSPITAL_COMMUNITY): Payer: Self-pay

## 2021-03-19 ENCOUNTER — Encounter: Payer: Self-pay | Admitting: Internal Medicine

## 2021-03-19 ENCOUNTER — Other Ambulatory Visit: Payer: Self-pay

## 2021-03-19 ENCOUNTER — Ambulatory Visit: Payer: 59 | Admitting: Internal Medicine

## 2021-03-19 VITALS — BP 132/82 | HR 67 | Temp 98.2°F | Resp 16 | Ht 62.0 in | Wt 145.0 lb

## 2021-03-19 DIAGNOSIS — I10 Essential (primary) hypertension: Secondary | ICD-10-CM | POA: Diagnosis not present

## 2021-03-19 DIAGNOSIS — N2889 Other specified disorders of kidney and ureter: Secondary | ICD-10-CM | POA: Diagnosis not present

## 2021-03-19 DIAGNOSIS — M810 Age-related osteoporosis without current pathological fracture: Secondary | ICD-10-CM | POA: Diagnosis not present

## 2021-03-19 DIAGNOSIS — M8000XD Age-related osteoporosis with current pathological fracture, unspecified site, subsequent encounter for fracture with routine healing: Secondary | ICD-10-CM

## 2021-03-19 DIAGNOSIS — M839 Adult osteomalacia, unspecified: Secondary | ICD-10-CM

## 2021-03-19 DIAGNOSIS — N1831 Chronic kidney disease, stage 3a: Secondary | ICD-10-CM | POA: Diagnosis not present

## 2021-03-19 LAB — BASIC METABOLIC PANEL
BUN: 30 mg/dL — ABNORMAL HIGH (ref 6–23)
CO2: 30 mEq/L (ref 19–32)
Calcium: 9.8 mg/dL (ref 8.4–10.5)
Chloride: 102 mEq/L (ref 96–112)
Creatinine, Ser: 0.99 mg/dL (ref 0.40–1.20)
GFR: 63.91 mL/min (ref >60.00)
Glucose, Bld: 99 mg/dL (ref 70–99)
Potassium: 5.1 mEq/L (ref 3.5–5.1)
Sodium: 136 mEq/L (ref 135–145)

## 2021-03-19 LAB — VITAMIN D 25 HYDROXY (VIT D DEFICIENCY, FRACTURES): VITD: 26.7 ng/mL — ABNORMAL LOW (ref 30.00–100.00)

## 2021-03-19 MED ORDER — AMLODIPINE BESYLATE 5 MG PO TABS
5.0000 mg | ORAL_TABLET | Freq: Every day | ORAL | 1 refills | Status: DC
  Filled 2021-03-19 – 2021-05-19 (×2): qty 90, 90d supply, fill #0
  Filled 2021-08-14: qty 90, 90d supply, fill #1

## 2021-03-19 MED ORDER — VALSARTAN 80 MG PO TABS
80.0000 mg | ORAL_TABLET | Freq: Two times a day (BID) | ORAL | 1 refills | Status: DC
  Filled 2021-03-19 – 2021-04-09 (×2): qty 180, 90d supply, fill #0
  Filled 2021-07-10 (×3): qty 180, 90d supply, fill #1

## 2021-03-19 MED ORDER — CHOLECALCIFEROL 50 MCG (2000 UT) PO TABS
2.0000 | ORAL_TABLET | Freq: Every day | ORAL | 1 refills | Status: DC
  Filled 2021-03-19: qty 180, 90d supply, fill #0

## 2021-03-19 NOTE — Telephone Encounter (Signed)
Joann Lima, MD  Jasper Loser, CMA ?She has completed 12 months of evenity  ?Will you see if she can start prolia on approximately 04/07/21?  ? ?Scarlette Calico, MD  ?

## 2021-03-19 NOTE — Progress Notes (Signed)
? ?Subjective:  ?Patient ID: Joann Bond, female    DOB: [birth year]  Age: 56 y.o. MRN: [id number] ? ?CC: Hypertension ? ?This visit occurred during the SARS-CoV-2 public health emergency.  Safety protocols were in place, including screening questions prior to the visit, additional usage of staff PPE, and extensive cleaning of exam room while observing appropriate contact time as indicated for disinfecting solutions.   ? ?HPI ?Joann Bond presents for f/up -  ? ?Her BP has been well controlled. She denies HA, BV, CP,DOE, SOB, edema, fatigue. ? ?Outpatient Medications Prior to Visit  ?Medication Sig Dispense Refill  ? gabapentin (NEURONTIN) 300 MG capsule Take 1 capsule (300 mg total) by mouth 3 (three) times daily. 270 capsule 0  ? nebivolol (BYSTOLIC) 5 MG tablet Take 1 tablet (5 mg total) by mouth daily. 30 tablet 3  ? [START ON 05/28/2021] oxyCODONE-acetaminophen (PERCOCET/ROXICET) 5-325 MG tablet Take 1 tablet by mouth every 8 (eight) hours as needed for pain for up to 30 days (max 3 tablets daily) 90 tablet 0  ? [START ON 04/28/2021] oxyCODONE-acetaminophen (PERCOCET/ROXICET) 5-325 MG tablet Take 1 tablet by mouth every 8 (eight) hours as needed for pain for up to 30 days (max 3 tablets daily) 90 tablet 0  ? [START ON 03/29/2021] oxyCODONE-acetaminophen (PERCOCET/ROXICET) 5-325 MG tablet Take 1 tablet by mouth every 8 (eight) hours as needed for pain for up to 30 days. (max 3 tablets daily) 90 tablet 0  ? tizanidine (ZANAFLEX) 2 MG capsule Take 1 capsule (2 mg total) by mouth 3 (three) times daily. 270 capsule 0  ? amLODipine (NORVASC) 5 MG tablet Take 1 tablet (5 mg total) by mouth daily. 90 tablet 1  ? Cholecalciferol 50 MCG (2000 UT) TABS Take 1 tablet (2,000 Units total) by mouth daily. 90 tablet 1  ? gabapentin (NEURONTIN) 300 MG capsule Take 300 mg by mouth 3 (three) times daily.    ? oxyCODONE-acetaminophen (PERCOCET/ROXICET) 5-325 MG tablet Take 1 tablet by mouth every 8 (eight) hours as needed for pain  for up to 30 days. 90 tablet 0  ? oxyCODONE-acetaminophen (PERCOCET/ROXICET) 5-325 MG tablet Take 1 tablet by mouth every 8 (eight) hours as needed for pain. 11/25/20 90 tablet 0  ? oxyCODONE-acetaminophen (PERCOCET/ROXICET) 5-325 MG tablet Take 1 tablet by mouth every 8 (eight) hours as needed for pain 90 tablet 0  ? oxyCODONE-acetaminophen (PERCOCET/ROXICET) 5-325 MG tablet Take 1 tablet by mouth every 8 (eight) hours as needed for pain 90 tablet 0  ? oxyCODONE-acetaminophen (PERCOCET/ROXICET) 5-325 MG tablet Take 1 tablet by mouth every 8 (eight) hours as needed for pain 90 tablet 0  ? Romosozumab-aqqg (EVENITY) 105 MG/1.17ML SOSY injection INJECT 210 MG INTO THE SKIN EVERY 30 DAYS. 2.34 mL 0  ? tizanidine (ZANAFLEX) 2 MG capsule Take 2 mg by mouth 3 (three) times daily.    ? valsartan (DIOVAN) 80 MG tablet Take 1 tablet (80 mg total) by mouth 2 (two) times daily. 180 tablet 1  ? ?No facility-administered medications prior to visit.  ? ? ?ROS ?Review of Systems  ?Constitutional:  Negative for chills, fatigue and fever.  ?HENT: Negative.    ?Eyes: Negative.   ?Respiratory: Negative.  Negative for cough, shortness of breath and wheezing.   ?Cardiovascular:  Negative for chest pain, palpitations and leg swelling.  ?Gastrointestinal:  Negative for abdominal pain, constipation, diarrhea and nausea.  ?Endocrine: Negative.   ?Genitourinary:  Negative for difficulty urinating, flank pain and hematuria.  ?Musculoskeletal:  Positive for back pain.  Negative for arthralgias and myalgias.  ?Skin: Negative.   ?Allergic/Immunologic: Negative.   ?Neurological: Negative.  Negative for dizziness, weakness, light-headedness and headaches.  ?Hematological:  Negative for adenopathy. Does not bruise/bleed easily.  ? ?Objective:  ?BP 132/82 (BP Location: Right Arm, Patient Position: Sitting, Cuff Size: Large)   Pulse 67   Temp 98.2 ?F (36.8 ?C) (Oral)   Resp 16   Ht '5\' 2"'$  (1.575 m)   Wt 145 lb (65.8 kg)   SpO2 98%   BMI 26.52  kg/m?  ? ?BP Readings from Last 3 Encounters:  ?03/19/21 132/82  ?12/04/20 138/84  ?12/04/20 126/76  ? ? ?Wt Readings from Last 3 Encounters:  ?03/19/21 145 lb (65.8 kg)  ?12/04/20 147 lb (66.7 kg)  ?12/04/20 147 lb (66.7 kg)  ? ? ?Physical Exam ?Vitals reviewed.  ?HENT:  ?   Nose: Nose normal.  ?   Mouth/Throat:  ?   Mouth: Mucous membranes are moist.  ?Eyes:  ?   General: No scleral icterus. ?   Conjunctiva/sclera: Conjunctivae normal.  ?Cardiovascular:  ?   Rate and Rhythm: Normal rate and regular rhythm.  ?   Heart sounds: No murmur heard. ?Pulmonary:  ?   Effort: Pulmonary effort is normal.  ?   Breath sounds: No stridor. No wheezing, rhonchi or rales.  ?Abdominal:  ?   General: Abdomen is flat.  ?   Palpations: There is no mass.  ?   Tenderness: There is no abdominal tenderness. There is no guarding.  ?   Hernia: No hernia is present.  ?Musculoskeletal:  ?   Cervical back: Neck supple.  ?   Right lower leg: No edema.  ?   Left lower leg: No edema.  ?Lymphadenopathy:  ?   Cervical: No cervical adenopathy.  ?Skin: ?   General: Skin is warm and dry.  ?   Findings: No lesion or rash.  ?Neurological:  ?   General: No focal deficit present.  ?   Mental Status: She is alert.  ?Psychiatric:     ?   Mood and Affect: Mood normal.     ?   Behavior: Behavior normal.  ? ? ?Lab Results  ?Component Value Date  ? WBC 6.7 08/21/2020  ? HGB 13.1 08/21/2020  ? HCT 39.3 08/21/2020  ? PLT 282.0 08/21/2020  ? GLUCOSE 99 03/19/2021  ? CHOL 223 (H) 01/24/2020  ? TRIG 279.0 (H) 01/24/2020  ? HDL 55.70 01/24/2020  ? LDLDIRECT 137.0 01/24/2020  ? ALT 18 01/24/2020  ? AST 17 01/24/2020  ? NA 136 03/19/2021  ? K 5.1 03/19/2021  ? CL 102 03/19/2021  ? CREATININE 0.99 03/19/2021  ? BUN 30 (H) 03/19/2021  ? CO2 30 03/19/2021  ? TSH 0.98 08/21/2020  ? ? ?MM 3D SCREEN BREAST BILATERAL ? ?Result Date: 11/25/2020 ?CLINICAL DATA:  Screening. EXAM: DIGITAL SCREENING BILATERAL MAMMOGRAM WITH TOMOSYNTHESIS AND CAD TECHNIQUE: Bilateral screening  digital craniocaudal and mediolateral oblique mammograms were obtained. Bilateral screening digital breast tomosynthesis was performed. The images were evaluated with computer-aided detection. COMPARISON:  Previous exam(s). ACR Breast Density Category c: The breast tissue is heterogeneously dense, which may obscure small masses. FINDINGS: There are no findings suspicious for malignancy. IMPRESSION: No mammographic evidence of malignancy. A result letter of this screening mammogram will be mailed directly to the patient. RECOMMENDATION: Screening mammogram in one year. (Code:SM-B-01Y) BI-RADS CATEGORY  1: Negative. Electronically Signed   By: Lillia Mountain M.D.   On: 11/25/2020 11:45  ? ? ?  Assessment & Plan:  ? ?Haelie was seen today for hypertension. ? ?Diagnoses and all orders for this visit: ? ?Age-related osteoporosis with current pathological fracture with routine healing, subsequent encounter ?-     Basic metabolic panel; Future ?-     VITAMIN D 25 Hydroxy (Vit-D Deficiency, Fractures); Future ?-     VITAMIN D 25 Hydroxy (Vit-D Deficiency, Fractures) ?-     Basic metabolic panel ?-     Cholecalciferol 50 MCG (2000 UT) TABS; Take 2 tablets (4,000 Units total) by mouth daily. ? ?Primary hypertension- Her BP is well controlled ?-     Basic metabolic panel; Future ?-     amLODipine (NORVASC) 5 MG tablet; Take 1 tablet (5 mg total) by mouth daily. ?-     valsartan (DIOVAN) 80 MG tablet; Take 1 tablet (80 mg total) by mouth 2 (two) times daily. ?-     Basic metabolic panel ? ?Stage 3a chronic kidney disease ([place])- Her renal fxn is stable. ?-     Basic metabolic panel; Future ?-     Basic metabolic panel ? ?Osteoporosis, unspecified osteoporosis type, unspecified pathological fracture presence ? ?Osteomalacia, vitamin D deficient- Her D is still low. Will increase the dose of the Vit D supplement. ?-     Cholecalciferol 50 MCG (2000 UT) TABS; Take 2 tablets (4,000 Units total) by mouth daily. ? ?Right renal mass- Will  monitor for malignant transformation. ?-     MR Abdomen W Wo Contrast; Future ? ?Other specified disorders of kidney and ureter ?-     MR Abdomen W Wo Contrast; Future ? ? ?I have discontinued [employer].

## 2021-03-19 NOTE — Patient Instructions (Signed)

## 2021-03-19 NOTE — Telephone Encounter (Signed)
Joann Bond! Can you see whats going on with the pt's evenity. She is here in office with a letter stating that her PA for the injection was not approved because she does not meet the criteria. Thanks! ?

## 2021-03-19 NOTE — Telephone Encounter (Signed)
Prolia VOB initiated via [email or web address] ? ?Evenity to Prolia transition ? ?

## 2021-03-20 ENCOUNTER — Other Ambulatory Visit (HOSPITAL_COMMUNITY): Payer: Self-pay

## 2021-03-27 ENCOUNTER — Other Ambulatory Visit (HOSPITAL_COMMUNITY): Payer: Self-pay

## 2021-03-28 ENCOUNTER — Other Ambulatory Visit (HOSPITAL_COMMUNITY): Payer: Self-pay

## 2021-03-28 MED ORDER — TIZANIDINE HCL 2 MG PO CAPS
2.0000 mg | ORAL_CAPSULE | Freq: Three times a day (TID) | ORAL | 0 refills | Status: DC
  Filled 2021-03-28 – 2021-07-14 (×2): qty 270, 90d supply, fill #0

## 2021-03-28 MED ORDER — TIZANIDINE HCL 2 MG PO CAPS
2.0000 mg | ORAL_CAPSULE | Freq: Three times a day (TID) | ORAL | 0 refills | Status: DC
  Filled 2021-03-28: qty 270, 90d supply, fill #0

## 2021-03-28 MED ORDER — GABAPENTIN 300 MG PO CAPS
300.0000 mg | ORAL_CAPSULE | Freq: Three times a day (TID) | ORAL | 0 refills | Status: DC
  Filled 2021-03-28 (×3): qty 270, 90d supply, fill #0

## 2021-03-31 ENCOUNTER — Other Ambulatory Visit (HOSPITAL_COMMUNITY): Payer: Self-pay

## 2021-04-07 ENCOUNTER — Ambulatory Visit
Admission: RE | Admit: 2021-04-07 | Discharge: 2021-04-07 | Disposition: A | Discharge status: Home / Self Care | Payer: 59 | Source: Ambulatory Visit | Attending: Internal Medicine | Admitting: Internal Medicine

## 2021-04-07 DIAGNOSIS — N2889 Other specified disorders of kidney and ureter: Secondary | ICD-10-CM

## 2021-04-07 MED ORDER — GADOBENATE DIMEGLUMINE 529 MG/ML IV SOLN
14.0000 mL | Freq: Once | INTRAVENOUS | Status: AC | PRN
  Administered 2021-04-07: 14 mL via INTRAVENOUS

## 2021-04-08 ENCOUNTER — Other Ambulatory Visit: Payer: Self-pay | Admitting: Internal Medicine

## 2021-04-08 DIAGNOSIS — N2889 Other specified disorders of kidney and ureter: Secondary | ICD-10-CM

## 2021-04-09 ENCOUNTER — Other Ambulatory Visit: Payer: Self-pay | Admitting: Internal Medicine

## 2021-04-09 ENCOUNTER — Other Ambulatory Visit (HOSPITAL_COMMUNITY): Payer: Self-pay

## 2021-04-09 MED ORDER — NEBIVOLOL HCL 5 MG PO TABS
5.0000 mg | ORAL_TABLET | Freq: Every day | ORAL | 3 refills | Status: DC
  Filled 2021-04-09: qty 30, 30d supply, fill #0
  Filled 2021-05-19: qty 30, 30d supply, fill #1
  Filled 2021-06-19: qty 30, 30d supply, fill #2
  Filled 2021-07-21: qty 30, 30d supply, fill #3

## 2021-04-10 ENCOUNTER — Other Ambulatory Visit (HOSPITAL_COMMUNITY): Payer: Self-pay

## 2021-04-14 ENCOUNTER — Other Ambulatory Visit (HOSPITAL_COMMUNITY): Payer: Self-pay

## 2021-04-24 NOTE — Telephone Encounter (Signed)
Prior Authorization initiated for PROLIA via [email or web address] ?KEY: [id number] ?  ?

## 2021-04-24 NOTE — Telephone Encounter (Signed)
Prior auth APPROVED ?PA# [id number] ? ? ? ?

## 2021-04-30 ENCOUNTER — Other Ambulatory Visit (HOSPITAL_COMMUNITY): Payer: Self-pay

## 2021-04-30 MED ORDER — DENOSUMAB 60 MG/ML ~~LOC~~ SOSY
60.0000 mg | PREFILLED_SYRINGE | Freq: Once | SUBCUTANEOUS | 0 refills | Status: DC
  Filled 2021-04-30: qty 1, 1d supply, fill #0

## 2021-04-30 NOTE — Telephone Encounter (Signed)
Rx for Prolia sent to [facility].  ? ?Pt ready for scheduling on or after 04/24/21 (pt will need to pick up rx from pharmacy) ? ?Out-of-pocket cost due at time of visit: n/a ?Specialty Pharmacy will determine cost - [facility] ? ?Primary: [facility] ?Prolia co-insurance:  ?Admin fee co-insurance:  ? ?Secondary: n/a ?Prolia co-insurance:  ?Admin fee co-insurance:  ? ?Deductible:  ? ?Prior Auth: APPROVED ?PA# [id number] ?Valid: 04/24/21-04/24/22 ?  ? ?** This summary of benefits is an estimation of the patient's out-of-pocket cost. Exact cost may very based on individual plan coverage.  ? ?

## 2021-04-30 NOTE — Addendum Note (Signed)
Addended by: Jasper Loser on: 04/30/2021 07:48 AM ? ? Modules accepted: Orders ? ?

## 2021-05-01 ENCOUNTER — Other Ambulatory Visit (HOSPITAL_COMMUNITY): Payer: Self-pay

## 2021-05-07 DIAGNOSIS — N281 Cyst of kidney, acquired: Secondary | ICD-10-CM | POA: Diagnosis not present

## 2021-05-19 ENCOUNTER — Other Ambulatory Visit (HOSPITAL_COMMUNITY): Payer: Self-pay

## 2021-05-22 ENCOUNTER — Other Ambulatory Visit: Payer: Self-pay | Admitting: Urology

## 2021-05-22 ENCOUNTER — Other Ambulatory Visit (HOSPITAL_COMMUNITY): Payer: Self-pay | Admitting: Urology

## 2021-05-22 DIAGNOSIS — N2889 Other specified disorders of kidney and ureter: Secondary | ICD-10-CM

## 2021-06-03 ENCOUNTER — Other Ambulatory Visit (HOSPITAL_COMMUNITY): Payer: Self-pay

## 2021-06-04 ENCOUNTER — Other Ambulatory Visit (HOSPITAL_COMMUNITY): Payer: Self-pay

## 2021-06-13 ENCOUNTER — Other Ambulatory Visit (HOSPITAL_COMMUNITY): Payer: Self-pay

## 2021-06-13 DIAGNOSIS — M5136 Other intervertebral disc degeneration, lumbar region: Secondary | ICD-10-CM | POA: Diagnosis not present

## 2021-06-13 DIAGNOSIS — Z79891 Long term (current) use of opiate analgesic: Secondary | ICD-10-CM | POA: Diagnosis not present

## 2021-06-13 DIAGNOSIS — G894 Chronic pain syndrome: Secondary | ICD-10-CM | POA: Diagnosis not present

## 2021-06-13 DIAGNOSIS — M47816 Spondylosis without myelopathy or radiculopathy, lumbar region: Secondary | ICD-10-CM | POA: Diagnosis not present

## 2021-06-13 MED ORDER — OXYCODONE-ACETAMINOPHEN 5-325 MG PO TABS
1.0000 | ORAL_TABLET | Freq: Three times a day (TID) | ORAL | 0 refills | Status: DC | PRN
  Filled 2021-07-04: qty 90, 30d supply, fill #0

## 2021-06-13 MED ORDER — OXYCODONE-ACETAMINOPHEN 5-325 MG PO TABS: 1.0000 | ORAL_TABLET | Freq: Three times a day (TID) | ORAL | 0 refills | Status: DC | PRN

## 2021-06-19 ENCOUNTER — Other Ambulatory Visit (HOSPITAL_COMMUNITY): Payer: Self-pay

## 2021-06-27 DIAGNOSIS — N281 Cyst of kidney, acquired: Secondary | ICD-10-CM | POA: Diagnosis not present

## 2021-07-04 ENCOUNTER — Other Ambulatory Visit (HOSPITAL_COMMUNITY): Payer: Self-pay

## 2021-07-04 NOTE — Telephone Encounter (Signed)
June 13, 2021 Joann Bond to [facility] (supporting Joann Bond)   JL    06/13/21  5:33 PM I have to put on hold for now. I am having surgery soon

## 2021-07-09 ENCOUNTER — Encounter (HOSPITAL_COMMUNITY): Payer: 59

## 2021-07-10 ENCOUNTER — Other Ambulatory Visit (HOSPITAL_COMMUNITY): Payer: Self-pay

## 2021-07-11 ENCOUNTER — Other Ambulatory Visit (HOSPITAL_COMMUNITY): Payer: Self-pay

## 2021-07-11 NOTE — Patient Instructions (Addendum)
DUE TO SPACE LIMITATIONS, ONLY TWO VISITORS  (aged 56 and older) ARE ALLOWED TO COME WITH YOU AND STAY IN THE WAITING ROOM DURING YOUR PRE OP AND PROCEDURE.   **NO VISITORS ARE ALLOWED IN THE SHORT STAY AREA OR RECOVERY ROOM!!**  IF YOU WILL BE ADMITTED INTO THE HOSPITAL YOU ARE ALLOWED ONLY FOUR SUPPORT PEOPLE DURING VISITATION HOURS (7 AM -8PM)   The support person(s) must pass our screening, and use Hand sanitizing gel. Visitors GUEST BADGE MUST BE WORN VISIBLY  One adult visitor may remain with you overnight and MUST be in the room by 8 P.M.   You are not required to quarantine at this time prior to your surgery. However, you must do this: Hand Hygiene often Do NOT share personal items Notify your provider if you are in close contact with someone who has COVID or you develop fever 100.4 or greater, new onset of sneezing, cough, sore throat, shortness of breath or body aches.       Your procedure is scheduled on:  July 25, 2021  Report to [facility] Main Entrance.  Report to admitting at:  10:00 AM  +++++Call this number if you have any questions or problems the morning of surgery [phone]  CLEAR LIQUID DIET the day prior to your surgery/procedure.  After Midnight you will continue with clear liquids until  09:15 AM DAY OF SURGERY  Clear Liquid Diet Water Black Coffee (sugar ok, NO MILK/CREAM OR CREAMERS)  Tea (sugar ok, NO MILK/CREAM OR CREAMERS) regular and decaf                             Plain Jell-O (NO RED)                                           Fruit ices (not with fruit pulp, NO RED)                                     Popsicles (NO RED)                                                                  Juice: apple, WHITE grape, WHITE cranberry Sports drinks like Gatorade (NO RED) Clear broth(vegetable,chicken,beef)                 FOLLOW  ANY ADDITIONAL PRE OP INSTRUCTIONS YOU RECEIVED FROM YOUR SURGEON'S OFFICE!!!   Oral Hygiene is also important  to reduce your risk of infection.        Remember - BRUSH YOUR TEETH THE MORNING OF SURGERY WITH YOUR REGULAR TOOTHPASTE  Take ONLY these medicines the morning of surgery with A SIP OF WATER: Gabapentin, Amlodipine, and Percocet if you need it for pain.                    You may not have any metal on your body including hair pins, jewelry, and body piercing  Do not wear make-up, lotions, powders, perfumes or deodorant  Do not wear  nail polish including gel and S&S, artificial / acrylic nails, or any other type of covering on natural nails including finger and toenails. If you have artificial nails, gel coating, etc., that needs to be removed by a nail salon, Please have this removed prior to surgery. Not doing so may mean that your surgery could be cancelled or delayed if the Surgeon or anesthesia staff feels like they are unable to monitor you safely.   Do not shave 48 hours prior to surgery to avoid nicks in your skin which may contribute to postoperative infections.   Contacts, Hearing Aids, dentures or bridgework may not be worn into surgery.   You may bring a small overnight bag with you on the day of surgery, only pack items that are not valuable .[facility] IS NOT RESPONSIBLE   FOR VALUABLES THAT ARE LOST OR STOLEN.   DO NOT [place]. PHARMACY WILL DISPENSE MEDICATIONS LISTED ON YOUR MEDICATION LIST TO YOU DURING YOUR ADMISSION [place]!    Special Instructions: Bring a copy of your healthcare power of attorney and living will documents the day of surgery, if you wish to have them scanned into your [place] Medical Records- EPIC  Please read over the following fact sheets you were given: IF YOU HAVE QUESTIONS ABOUT YOUR PRE-OP INSTRUCTIONS, PLEASE CALL [phone]  ([place])   [facility] - Preparing for Surgery Before surgery, you can play an important role.  Because skin is not sterile, your skin needs to be as free of germs as possible.   You can reduce the number of germs on your skin by washing with CHG (chlorahexidine gluconate) soap before surgery.  CHG is an antiseptic cleaner which kills germs and bonds with the skin to continue killing germs even after washing. Please DO NOT use if you have an allergy to CHG or antibacterial soaps.  If your skin becomes reddened/irritated stop using the CHG and inform your nurse when you arrive at Short Stay. Do not shave (including legs and underarms) for at least 48 hours prior to the first CHG shower.  You may shave your face/neck.  Please follow these instructions carefully:  1.  Shower with CHG Soap the night before surgery and the  morning of surgery.  2.  If you choose to wash your hair, wash your hair first as usual with your normal  shampoo.  3.  After you shampoo, rinse your hair and body thoroughly to remove the shampoo.                             4.  Use CHG as you would any other liquid soap.  You can apply chg directly to the skin and wash.  Gently with a scrungie or clean washcloth.  5.  Apply the CHG Soap to your body ONLY FROM THE NECK DOWN.   Do not use on face/ open                           Wound or open sores. Avoid contact with eyes, ears mouth and genitals (private parts).                       Wash face,  Genitals (private parts) with your normal soap.             6.  Wash thoroughly, paying special attention  to the area where your  surgery  will be performed.  7.  Thoroughly rinse your body with warm water from the neck down.  8.  DO NOT shower/wash with your normal soap after using and rinsing off the CHG Soap.            9.  Pat yourself dry with a clean towel.            10.  Wear clean pajamas.            11.  Place clean sheets on your bed the night of your first shower and do not  sleep with pets.  ON THE DAY OF SURGERY : Do not apply any lotions/deodorants the morning of surgery.  Please wear clean clothes to the hospital/surgery center.    FAILURE TO  FOLLOW THESE INSTRUCTIONS MAY RESULT IN THE CANCELLATION OF YOUR SURGERY  PATIENT SIGNATURE_________________________________  NURSE SIGNATURE__________________________________  ________________________________________________________________________    Adam Phenix    An incentive spirometer is a tool that can help keep your lungs clear and active. This tool measures how well you are filling your lungs with each breath. Taking long deep breaths may help reverse or decrease the chance of developing breathing (pulmonary) problems (especially infection) following: A long period of time when you are unable to move or be active. BEFORE THE PROCEDURE  If the spirometer includes an indicator to show your best effort, your nurse or respiratory therapist will set it to a desired goal. If possible, sit up straight or lean slightly forward. Try not to slouch. Hold the incentive spirometer in an upright position. INSTRUCTIONS FOR USE  Sit on the edge of your bed if possible, or sit up as far as you can in bed or on a chair. Hold the incentive spirometer in an upright position. Breathe out normally. Place the mouthpiece in your mouth and seal your lips tightly around it. Breathe in slowly and as deeply as possible, raising the piston or the ball toward the top of the column. Hold your breath for 3-5 seconds or for as long as possible. Allow the piston or ball to fall to the bottom of the column. Remove the mouthpiece from your mouth and breathe out normally. Rest for a few seconds and repeat Steps 1 through 7 at least 10 times every 1-2 hours when you are awake. Take your time and take a few normal breaths between deep breaths. The spirometer may include an indicator to show your best effort. Use the indicator as a goal to work toward during each repetition. After each set of 10 deep breaths, practice coughing to be sure your lungs are clear. If you have an incision (the cut made at the time  of surgery), support your incision when coughing by placing a pillow or rolled up towels firmly against it. Once you are able to get out of bed, walk around indoors and cough well. You may stop using the incentive spirometer when instructed by your caregiver.  RISKS AND COMPLICATIONS Take your time so you do not get dizzy or light-headed. If you are in pain, you may need to take or ask for pain medication before doing incentive spirometry. It is harder to take a deep breath if you are having pain. AFTER USE Rest and breathe slowly and easily. It can be helpful to keep track of a log of your progress. Your caregiver can provide you with a simple table to help with this. If you are using the  spirometer at home, follow these instructions: [place] IF:  You are having difficultly using the spirometer. You have trouble using the spirometer as often as instructed. Your pain medication is not giving enough relief while using the spirometer. You develop fever of 100.5 F (38.1 C) or higher.                                                                                                    SEEK IMMEDIATE MEDICAL CARE IF:  You cough up bloody sputum that had not been present before. You develop fever of 102 F (38.9 C) or greater. You develop worsening pain at or near the incision site. MAKE SURE YOU:  Understand these instructions. Will watch your condition. Will get help right away if you are not doing well or get worse. Document Released: 05/04/2006 Document Revised: 03/16/2011 Document Reviewed: 07/05/2006 [facility] Patient Information 2014 [place], [place].  WHAT IS A BLOOD TRANSFUSION? Blood Transfusion Information  A transfusion is the replacement of blood or some of its parts. Blood is made up of multiple cells which provide different functions. Red blood cells carry oxygen and are used for blood loss replacement. White blood cells fight against infection. Platelets control  bleeding. Plasma helps clot blood. Other blood products are available for specialized needs, such as hemophilia or other clotting disorders. BEFORE THE TRANSFUSION  Who gives blood for transfusions?  Healthy volunteers who are fully evaluated to make sure their blood is safe. This is blood bank blood. Transfusion therapy is the safest it has ever been in the practice of medicine. Before blood is taken from a donor, a complete history is taken to make sure that person has no history of diseases nor engages in risky social behavior (examples are intravenous drug use or sexual activity with multiple partners). The donor's travel history is screened to minimize risk of transmitting infections, such as malaria. The donated blood is tested for signs of infectious diseases, such as HIV and hepatitis. The blood is then tested to be sure it is compatible with you in order to minimize the chance of a transfusion reaction. If you or a relative donates blood, this is often done in anticipation of surgery and is not appropriate for emergency situations. It takes many days to process the donated blood. RISKS AND COMPLICATIONS Although transfusion therapy is very safe and saves many lives, the main dangers of transfusion include:  Getting an infectious disease. Developing a transfusion reaction. This is an allergic reaction to something in the blood you were given. Every precaution is taken to prevent this. The decision to have a blood transfusion has been considered carefully by your caregiver before blood is given. Blood is not given unless the benefits outweigh the risks. AFTER THE TRANSFUSION Right after receiving a blood transfusion, you will usually feel much better and more energetic. This is especially true if your red blood cells have gotten low (anemic). The transfusion raises the level of the red blood cells which carry oxygen, and this usually causes an energy increase. The nurse administering the  transfusion will monitor  you carefully for complications. HOME CARE INSTRUCTIONS  No special instructions are needed after a transfusion. You may find your energy is better. Speak with your caregiver about any limitations on activity for underlying diseases you may have. SEEK MEDICAL CARE IF:  Your condition is not improving after your transfusion. You develop redness or irritation at the intravenous (IV) site. SEEK IMMEDIATE MEDICAL CARE IF:  Any of the following symptoms occur over the next 12 hours: Shaking chills. You have a temperature by mouth above 102 F (38.9 C), not controlled by medicine. Chest, back, or muscle pain. People around you feel you are not acting correctly or are confused. Shortness of breath or difficulty breathing. Dizziness and fainting. You get a rash or develop hives. You have a decrease in urine output. Your urine turns a dark color or changes to pink, red, or brown. Any of the following symptoms occur over the next 10 days: You have a temperature by mouth above 102 F (38.9 C), not controlled by medicine. Shortness of breath. Weakness after normal activity. The white part of the eye turns yellow (jaundice). You have a decrease in the amount of urine or are urinating less often. Your urine turns a dark color or changes to pink, red, or brown. Document Released: 12/20/1999 Document Revised: 03/16/2011 Document Reviewed: 08/08/2007 [facility] Patient Information 2014 [place], [place].  _______________________________________________________________________

## 2021-07-11 NOTE — Progress Notes (Addendum)
COVID Vaccine received:  '[]'$  No '[x]'$  Yes  Date of any COVID positive Test in last 82 days:None  PCP - Scarlette Calico, MD  Cardiologist -  Nephrology - Dr Rexene Alberts  Chest x-ray - n/a EKG -  08-21-20  Epic Stress Test - none ECHO - none Cardiac Cath - none  Pacemaker/ICD device last checked: Date:       '[x]'$  N/A Spinal Cord Stimulator:'[x]'$  No '[]'$  Yes   Other Implants:   Bowel Prep - Clear liquids day prior.   History of Sleep Apnea? '[x]'$  No '[]'$  Yes  '[]'$  unknown  Does the patient monitor blood sugar? '[]'$  No '[]'$  Yes  '[x]'$  N/A  Blood Thinner Instructions: none Aspirin Instructions: none Last Dose:  Activity level:  Can go up a flight of stairs and perform activities of daily living without stopping and       without symptoms of chest pain or shortness of breath.'[]'$  No '[x]'$  Yes  '[]'$  N/A  Able to do exercises without symptoms '[]'$  No '[x]'$  Yes  '[]'$  N/A  Patient able to complete ADLs without assistance '[]'$  No '[]'$  Yes  '[x]'$  N/A    Comments:   Anesthesia review: Asthma, HTN, CKD3, Hx Thoracolumbar fusion (Harrington rods) for scoliosis.  Patient denies shortness of breath, fever, cough and chest pain at PAT appointment  Patient verbalized understanding and agreement to the Pre-Surgical Instructions that were given to them at this PAT appointment. Patient was also educated of the need to review these PAT instructions again prior to his/her surgery.I reviewed the appropriate phone numbers to call if they have any and questions or concerns.

## 2021-07-14 ENCOUNTER — Other Ambulatory Visit: Payer: Self-pay

## 2021-07-14 ENCOUNTER — Encounter (HOSPITAL_COMMUNITY)
Admission: RE | Admit: 2021-07-14 | Discharge: 2021-07-14 | Disposition: A | Discharge status: Home / Self Care | Payer: 59 | Source: Ambulatory Visit | Attending: Urology | Admitting: Urology

## 2021-07-14 ENCOUNTER — Encounter (HOSPITAL_COMMUNITY): Payer: Self-pay

## 2021-07-14 ENCOUNTER — Other Ambulatory Visit (HOSPITAL_COMMUNITY): Payer: Self-pay

## 2021-07-14 VITALS — BP 130/89 | HR 74 | Temp 98.1°F | Resp 20 | Ht 63.0 in | Wt 146.0 lb

## 2021-07-14 DIAGNOSIS — I10 Essential (primary) hypertension: Secondary | ICD-10-CM | POA: Diagnosis not present

## 2021-07-14 DIAGNOSIS — Z01818 Encounter for other preprocedural examination: Secondary | ICD-10-CM | POA: Insufficient documentation

## 2021-07-14 HISTORY — DX: Chronic kidney disease, unspecified: N18.9

## 2021-07-14 HISTORY — DX: Pneumonia, unspecified organism: J18.9

## 2021-07-14 LAB — BASIC METABOLIC PANEL
Anion gap: 8 (ref 5–15)
BUN: 38 mg/dL — ABNORMAL HIGH (ref 6–20)
CO2: 28 mmol/L (ref 22–32)
Calcium: 10.2 mg/dL (ref 8.9–10.3)
Chloride: 103 mmol/L (ref 98–111)
Creatinine, Ser: 1.08 mg/dL — ABNORMAL HIGH (ref 0.44–1.00)
GFR, Estimated: >60 mL/min (ref >60)
Glucose, Bld: 128 mg/dL — ABNORMAL HIGH (ref 70–99)
Potassium: 4.9 mmol/L (ref 3.5–5.1)
Sodium: 139 mmol/L (ref 135–145)

## 2021-07-14 LAB — TYPE AND SCREEN
ABO/RH(D): B POS
Antibody Screen: NEGATIVE

## 2021-07-14 LAB — CBC
HCT: 41.5 % (ref 36.0–46.0)
Hemoglobin: 13.3 g/dL (ref 12.0–15.0)
MCH: 30 pg (ref 26.0–34.0)
MCHC: 32 g/dL (ref 30.0–36.0)
MCV: 93.5 fL (ref 80.0–100.0)
Platelets: 289 10*3/uL (ref 150–400)
RBC: 4.44 MIL/uL (ref 3.87–5.11)
RDW: 13.2 % (ref 11.5–15.5)
WBC: 6.6 10*3/uL (ref 4.0–10.5)
nRBC: 0 % (ref 0.0–0.2)

## 2021-07-15 ENCOUNTER — Other Ambulatory Visit (HOSPITAL_COMMUNITY): Payer: Self-pay

## 2021-07-15 MED ORDER — GABAPENTIN 300 MG PO CAPS
300.0000 mg | ORAL_CAPSULE | Freq: Three times a day (TID) | ORAL | 0 refills | Status: DC
  Filled 2021-07-15: qty 270, 90d supply, fill #0

## 2021-07-21 ENCOUNTER — Other Ambulatory Visit (HOSPITAL_COMMUNITY): Payer: Self-pay

## 2021-07-25 ENCOUNTER — Ambulatory Visit (HOSPITAL_COMMUNITY): Payer: 59 | Admitting: Certified Registered Nurse Anesthetist

## 2021-07-25 ENCOUNTER — Other Ambulatory Visit (HOSPITAL_COMMUNITY): Payer: Self-pay

## 2021-07-25 ENCOUNTER — Encounter (HOSPITAL_COMMUNITY): Payer: Self-pay | Admitting: Urology

## 2021-07-25 ENCOUNTER — Other Ambulatory Visit: Payer: Self-pay

## 2021-07-25 ENCOUNTER — Ambulatory Visit (HOSPITAL_BASED_OUTPATIENT_CLINIC_OR_DEPARTMENT_OTHER): Payer: 59 | Admitting: Certified Registered Nurse Anesthetist

## 2021-07-25 ENCOUNTER — Observation Stay (HOSPITAL_COMMUNITY)
Admission: RE | Admit: 2021-07-25 | Discharge: 2021-07-26 | Disposition: A | Discharge status: Home / Self Care | Payer: 59 | Source: Ambulatory Visit | Attending: Urology | Admitting: Urology

## 2021-07-25 ENCOUNTER — Other Ambulatory Visit (HOSPITAL_COMMUNITY): Payer: 59

## 2021-07-25 ENCOUNTER — Encounter (HOSPITAL_COMMUNITY)
Admission: RE | Disposition: A | Discharge status: Home / Self Care | Payer: Self-pay | Source: Ambulatory Visit | Attending: Urology

## 2021-07-25 DIAGNOSIS — C641 Malignant neoplasm of right kidney, except renal pelvis: Principal | ICD-10-CM | POA: Insufficient documentation

## 2021-07-25 DIAGNOSIS — N2889 Other specified disorders of kidney and ureter: Secondary | ICD-10-CM | POA: Diagnosis present

## 2021-07-25 DIAGNOSIS — N281 Cyst of kidney, acquired: Secondary | ICD-10-CM | POA: Diagnosis not present

## 2021-07-25 DIAGNOSIS — I129 Hypertensive chronic kidney disease with stage 1 through stage 4 chronic kidney disease, or unspecified chronic kidney disease: Secondary | ICD-10-CM | POA: Diagnosis not present

## 2021-07-25 DIAGNOSIS — N269 Renal sclerosis, unspecified: Secondary | ICD-10-CM | POA: Diagnosis not present

## 2021-07-25 DIAGNOSIS — N1831 Chronic kidney disease, stage 3a: Secondary | ICD-10-CM | POA: Diagnosis not present

## 2021-07-25 DIAGNOSIS — J45909 Unspecified asthma, uncomplicated: Secondary | ICD-10-CM | POA: Diagnosis not present

## 2021-07-25 DIAGNOSIS — N039 Chronic nephritic syndrome with unspecified morphologic changes: Secondary | ICD-10-CM | POA: Diagnosis not present

## 2021-07-25 DIAGNOSIS — Z79899 Other long term (current) drug therapy: Secondary | ICD-10-CM | POA: Insufficient documentation

## 2021-07-25 HISTORY — PX: ROBOTIC ASSITED PARTIAL NEPHRECTOMY: SHX6087

## 2021-07-25 LAB — ABO/RH: ABO/RH(D): B POS

## 2021-07-25 LAB — HEMOGLOBIN AND HEMATOCRIT, BLOOD
HCT: 39.2 % (ref 36.0–46.0)
Hemoglobin: 12.9 g/dL (ref 12.0–15.0)

## 2021-07-25 SURGERY — NEPHRECTOMY, PARTIAL, ROBOT-ASSISTED
Anesthesia: General | Laterality: Right

## 2021-07-25 MED ORDER — SODIUM CHLORIDE (PF) 0.9 % IJ SOLN
INTRAMUSCULAR | Status: AC
  Filled 2021-07-25: qty 20

## 2021-07-25 MED ORDER — FENTANYL CITRATE (PF) 100 MCG/2ML IJ SOLN
INTRAMUSCULAR | Status: DC | PRN
  Administered 2021-07-25: 100 ug via INTRAVENOUS
  Administered 2021-07-25: 50 ug via INTRAVENOUS

## 2021-07-25 MED ORDER — DOCUSATE SODIUM 100 MG PO CAPS: 100.0000 mg | ORAL_CAPSULE | Freq: Two times a day (BID) | ORAL | Status: DC

## 2021-07-25 MED ORDER — MIDAZOLAM HCL 5 MG/5ML IJ SOLN
INTRAMUSCULAR | Status: DC | PRN
  Administered 2021-07-25: 2 mg via INTRAVENOUS

## 2021-07-25 MED ORDER — HEMOSTATIC AGENTS (NO CHARGE) OPTIME
TOPICAL | Status: DC | PRN
  Administered 2021-07-25: 1 via TOPICAL

## 2021-07-25 MED ORDER — EPHEDRINE SULFATE (PRESSORS) 50 MG/ML IJ SOLN
INTRAMUSCULAR | Status: DC | PRN
  Administered 2021-07-25 (×2): 5 mg via INTRAVENOUS
  Administered 2021-07-25: 10 mg via INTRAVENOUS
  Administered 2021-07-25 (×3): 5 mg via INTRAVENOUS

## 2021-07-25 MED ORDER — HYDROMORPHONE HCL 1 MG/ML IJ SOLN: 0.2500 mg | INTRAMUSCULAR | Status: DC | PRN

## 2021-07-25 MED ORDER — ONDANSETRON HCL 4 MG/2ML IJ SOLN
INTRAMUSCULAR | Status: AC
  Filled 2021-07-25: qty 2

## 2021-07-25 MED ORDER — OXYCODONE-ACETAMINOPHEN 5-325 MG PO TABS
1.0000 | ORAL_TABLET | Freq: Three times a day (TID) | ORAL | 0 refills | Status: DC | PRN
Start: 2021-07-25 — End: 2021-09-10
  Filled 2021-07-25 – 2021-08-04 (×2): qty 20, 4d supply, fill #0

## 2021-07-25 MED ORDER — DOCUSATE SODIUM 100 MG PO CAPS
100.0000 mg | ORAL_CAPSULE | Freq: Two times a day (BID) | ORAL | Status: DC
  Administered 2021-07-25 – 2021-07-26 (×2): 100 mg via ORAL
  Filled 2021-07-25 (×2): qty 1

## 2021-07-25 MED ORDER — LIDOCAINE 2% (20 MG/ML) 5 ML SYRINGE
INTRAMUSCULAR | Status: DC | PRN
  Administered 2021-07-25: 60 mg via INTRAVENOUS
  Administered 2021-07-25: 1.5 mg/kg/h via INTRAVENOUS

## 2021-07-25 MED ORDER — FENTANYL CITRATE (PF) 250 MCG/5ML IJ SOLN
INTRAMUSCULAR | Status: AC
  Filled 2021-07-25: qty 5

## 2021-07-25 MED ORDER — PROPOFOL 10 MG/ML IV BOLUS
INTRAVENOUS | Status: DC | PRN
  Administered 2021-07-25: 130 mg via INTRAVENOUS

## 2021-07-25 MED ORDER — DIPHENHYDRAMINE HCL 12.5 MG/5ML PO ELIX
12.5000 mg | ORAL_SOLUTION | Freq: Four times a day (QID) | ORAL | Status: DC | PRN
  Administered 2021-07-26: 25 mg via ORAL
  Filled 2021-07-25: qty 10

## 2021-07-25 MED ORDER — ROCURONIUM BROMIDE 10 MG/ML (PF) SYRINGE
PREFILLED_SYRINGE | INTRAVENOUS | Status: DC | PRN
  Administered 2021-07-25: 20 mg via INTRAVENOUS
  Administered 2021-07-25: 60 mg via INTRAVENOUS
  Administered 2021-07-25: 40 mg via INTRAVENOUS

## 2021-07-25 MED ORDER — KETAMINE HCL 50 MG/5ML IJ SOSY
PREFILLED_SYRINGE | INTRAMUSCULAR | Status: AC
  Filled 2021-07-25: qty 5

## 2021-07-25 MED ORDER — KETAMINE HCL 10 MG/ML IJ SOLN
INTRAMUSCULAR | Status: DC | PRN
  Administered 2021-07-25 (×5): 5 mg via INTRAVENOUS
  Administered 2021-07-25: 20 mg via INTRAVENOUS

## 2021-07-25 MED ORDER — ONDANSETRON HCL 4 MG/2ML IJ SOLN: 4.0000 mg | INTRAMUSCULAR | Status: DC | PRN

## 2021-07-25 MED ORDER — MORPHINE SULFATE (PF) 4 MG/ML IV SOLN
2.0000 mg | INTRAVENOUS | Status: DC | PRN
  Administered 2021-07-25 – 2021-07-26 (×2): 2 mg via INTRAVENOUS
  Filled 2021-07-25 (×2): qty 1

## 2021-07-25 MED ORDER — MIDAZOLAM HCL 2 MG/2ML IJ SOLN
INTRAMUSCULAR | Status: AC
  Filled 2021-07-25: qty 2

## 2021-07-25 MED ORDER — SUGAMMADEX SODIUM 500 MG/5ML IV SOLN
INTRAVENOUS | Status: AC
  Filled 2021-07-25: qty 5

## 2021-07-25 MED ORDER — PROPOFOL 10 MG/ML IV BOLUS
INTRAVENOUS | Status: AC
  Filled 2021-07-25: qty 20

## 2021-07-25 MED ORDER — CEFAZOLIN SODIUM-DEXTROSE 2-4 GM/100ML-% IV SOLN
2.0000 g | Freq: Once | INTRAVENOUS | Status: AC
  Administered 2021-07-25: 2 g via INTRAVENOUS
  Filled 2021-07-25: qty 100

## 2021-07-25 MED ORDER — FENTANYL CITRATE PF 50 MCG/ML IJ SOSY
PREFILLED_SYRINGE | INTRAMUSCULAR | Status: AC
  Filled 2021-07-25: qty 2

## 2021-07-25 MED ORDER — ACETAMINOPHEN 500 MG PO TABS
1000.0000 mg | ORAL_TABLET | Freq: Four times a day (QID) | ORAL | Status: DC
  Administered 2021-07-25 – 2021-07-26 (×3): 1000 mg via ORAL
  Filled 2021-07-25 (×4): qty 2

## 2021-07-25 MED ORDER — LACTATED RINGERS IR SOLN
Status: DC | PRN
  Administered 2021-07-25: 1000 mL

## 2021-07-25 MED ORDER — LACTATED RINGERS IV SOLN: INTRAVENOUS | Status: DC

## 2021-07-25 MED ORDER — "VISTASEAL 4 ML SINGLE DOSE KIT "
4.0000 mL | PACK | Freq: Once | CUTANEOUS | Status: AC
  Administered 2021-07-25: 4 mL via TOPICAL
  Filled 2021-07-25: qty 4

## 2021-07-25 MED ORDER — HYOSCYAMINE SULFATE 0.125 MG SL SUBL: 0.1250 mg | SUBLINGUAL_TABLET | SUBLINGUAL | Status: DC | PRN

## 2021-07-25 MED ORDER — BUPIVACAINE LIPOSOME 1.3 % IJ SUSP
INTRAMUSCULAR | Status: DC | PRN
  Administered 2021-07-25: 20 mL

## 2021-07-25 MED ORDER — EPHEDRINE 5 MG/ML INJ
INTRAVENOUS | Status: AC
  Filled 2021-07-25: qty 10

## 2021-07-25 MED ORDER — SODIUM CHLORIDE 0.45 % IV SOLN: INTRAVENOUS | Status: DC

## 2021-07-25 MED ORDER — ROCURONIUM BROMIDE 10 MG/ML (PF) SYRINGE
PREFILLED_SYRINGE | INTRAVENOUS | Status: AC
  Filled 2021-07-25: qty 20

## 2021-07-25 MED ORDER — NEBIVOLOL HCL 5 MG PO TABS
5.0000 mg | ORAL_TABLET | Freq: Every day | ORAL | Status: DC
  Administered 2021-07-25: 5 mg via ORAL
  Filled 2021-07-25 (×2): qty 1

## 2021-07-25 MED ORDER — SODIUM CHLORIDE (PF) 0.9 % IJ SOLN
INTRAMUSCULAR | Status: DC | PRN
  Administered 2021-07-25: 20 mL

## 2021-07-25 MED ORDER — GABAPENTIN 300 MG PO CAPS
300.0000 mg | ORAL_CAPSULE | Freq: Three times a day (TID) | ORAL | Status: DC
  Administered 2021-07-25 – 2021-07-26 (×3): 300 mg via ORAL
  Filled 2021-07-25 (×3): qty 1

## 2021-07-25 MED ORDER — ACETAMINOPHEN 10 MG/ML IV SOLN
INTRAVENOUS | Status: AC
  Filled 2021-07-25: qty 100

## 2021-07-25 MED ORDER — SUGAMMADEX SODIUM 200 MG/2ML IV SOLN
INTRAVENOUS | Status: DC | PRN
  Administered 2021-07-25: 264.8 mg via INTRAVENOUS

## 2021-07-25 MED ORDER — DEXAMETHASONE SODIUM PHOSPHATE 10 MG/ML IJ SOLN
INTRAMUSCULAR | Status: AC
  Filled 2021-07-25: qty 1

## 2021-07-25 MED ORDER — DEXAMETHASONE SODIUM PHOSPHATE 4 MG/ML IJ SOLN
INTRAMUSCULAR | Status: DC | PRN
  Administered 2021-07-25: 10 mg via INTRAVENOUS

## 2021-07-25 MED ORDER — BUPIVACAINE LIPOSOME 1.3 % IJ SUSP
INTRAMUSCULAR | Status: AC
  Filled 2021-07-25: qty 20

## 2021-07-25 MED ORDER — ORAL CARE MOUTH RINSE: 15.0000 mL | Freq: Once | OROMUCOSAL | Status: AC

## 2021-07-25 MED ORDER — HYDROMORPHONE HCL 1 MG/ML IJ SOLN
0.5000 mg | INTRAMUSCULAR | Status: DC | PRN
  Administered 2021-07-26: 0.5 mg via INTRAVENOUS
  Filled 2021-07-25: qty 1

## 2021-07-25 MED ORDER — DIPHENHYDRAMINE HCL 50 MG/ML IJ SOLN
12.5000 mg | Freq: Four times a day (QID) | INTRAMUSCULAR | Status: DC | PRN
  Administered 2021-07-25: 12.5 mg via INTRAVENOUS
  Filled 2021-07-25: qty 1

## 2021-07-25 MED ORDER — ACETAMINOPHEN 10 MG/ML IV SOLN
1000.0000 mg | Freq: Once | INTRAVENOUS | Status: AC
  Administered 2021-07-25: 1000 mg via INTRAVENOUS

## 2021-07-25 MED ORDER — FENTANYL CITRATE PF 50 MCG/ML IJ SOSY
50.0000 ug | PREFILLED_SYRINGE | INTRAMUSCULAR | Status: DC | PRN
  Administered 2021-07-25 (×2): 50 ug via INTRAVENOUS

## 2021-07-25 MED ORDER — OXYCODONE HCL 5 MG PO TABS
5.0000 mg | ORAL_TABLET | ORAL | Status: DC | PRN
  Administered 2021-07-25 – 2021-07-26 (×2): 5 mg via ORAL
  Filled 2021-07-25 (×2): qty 1

## 2021-07-25 MED ORDER — ACETAMINOPHEN 500 MG PO TABS
1000.0000 mg | ORAL_TABLET | Freq: Once | ORAL | Status: AC
  Administered 2021-07-25: 1000 mg via ORAL
  Filled 2021-07-25: qty 2

## 2021-07-25 MED ORDER — CHLORHEXIDINE GLUCONATE 0.12 % MT SOLN
15.0000 mL | Freq: Once | OROMUCOSAL | Status: AC
  Administered 2021-07-25: 15 mL via OROMUCOSAL

## 2021-07-25 SURGICAL SUPPLY — 76 items
APPLICATOR SURGIFLO ENDO (HEMOSTASIS) ×1 IMPLANT
APPLICATOR VISTASEAL 35 (MISCELLANEOUS) ×2 IMPLANT
BAG COUNTER SPONGE SURGICOUNT (BAG) IMPLANT
CHLORAPREP W/TINT 26 (MISCELLANEOUS) ×2 IMPLANT
CLIP LIGATING HEM O LOK PURPLE (MISCELLANEOUS) ×2 IMPLANT
CLIP LIGATING HEMO LOK XL GOLD (MISCELLANEOUS) IMPLANT
CLIP LIGATING HEMO O LOK GREEN (MISCELLANEOUS) ×5 IMPLANT
CLIP SUT LAPRA TY ABSORB (SUTURE) ×5 IMPLANT
COVER SURGICAL LIGHT HANDLE (MISCELLANEOUS) ×2 IMPLANT
COVER TIP SHEARS 8 DVNC (MISCELLANEOUS) ×1 IMPLANT
COVER TIP SHEARS 8MM DA VINCI (MISCELLANEOUS) ×1
CUTTER ECHEON FLEX ENDO 45 340 (ENDOMECHANICALS) IMPLANT
DERMABOND ADVANCED (GAUZE/BANDAGES/DRESSINGS) ×1
DERMABOND ADVANCED .7 DNX12 (GAUZE/BANDAGES/DRESSINGS) ×1 IMPLANT
DRAIN CHANNEL 15F RND FF 3/16 (WOUND CARE) ×1 IMPLANT
DRAPE ARM DVNC X/XI (DISPOSABLE) ×4 IMPLANT
DRAPE COLUMN DVNC XI (DISPOSABLE) ×1 IMPLANT
DRAPE DA VINCI XI ARM (DISPOSABLE) ×4
DRAPE DA VINCI XI COLUMN (DISPOSABLE) ×1
DRAPE INCISE IOBAN 66X45 STRL (DRAPES) ×2 IMPLANT
DRAPE SHEET LG 3/4 BI-LAMINATE (DRAPES) ×2 IMPLANT
DRSG TEGADERM 4X4.75 (GAUZE/BANDAGES/DRESSINGS) ×1 IMPLANT
ELECT PENCIL ROCKER SW 15FT (MISCELLANEOUS) ×2 IMPLANT
ELECT REM PT RETURN 15FT ADLT (MISCELLANEOUS) ×2 IMPLANT
EVACUATOR SILICONE 100CC (DRAIN) ×1 IMPLANT
GAUZE SPONGE 2X2 8PLY STRL LF (GAUZE/BANDAGES/DRESSINGS) IMPLANT
GLOVE BIO SURGEON STRL SZ 6.5 (GLOVE) ×2 IMPLANT
GLOVE BIOGEL PI IND STRL 7.5 (GLOVE) ×2 IMPLANT
GLOVE BIOGEL PI INDICATOR 7.5 (GLOVE) ×2
GLOVE SURG LX 7.5 STRW (GLOVE) ×2
GLOVE SURG LX STRL 7.5 STRW (GLOVE) ×2 IMPLANT
GOWN SRG XL LVL 4 BRTHBL STRL (GOWNS) ×1 IMPLANT
GOWN STRL NON-REIN XL LVL4 (GOWNS) ×1
GOWN STRL REUS W/ TWL LRG LVL3 (GOWN DISPOSABLE) ×2 IMPLANT
GOWN STRL REUS W/TWL LRG LVL3 (GOWN DISPOSABLE) ×2
HEMOSTAT SURGICEL 4X8 (HEMOSTASIS) ×2 IMPLANT
HOLDER FOLEY CATH W/STRAP (MISCELLANEOUS) ×2 IMPLANT
IRRIG SUCT STRYKERFLOW 2 WTIP (MISCELLANEOUS) ×2
IRRIGATION SUCT STRKRFLW 2 WTP (MISCELLANEOUS) ×1 IMPLANT
KIT BASIN OR (CUSTOM PROCEDURE TRAY) ×2 IMPLANT
KIT TURNOVER KIT A (KITS) IMPLANT
LOOP VESSEL MAXI BLUE (MISCELLANEOUS) ×2 IMPLANT
MARKER SKIN DUAL TIP RULER LAB (MISCELLANEOUS) ×2 IMPLANT
NDL INSUFFLATION 14GA 120MM (NEEDLE) ×1 IMPLANT
NEEDLE INSUFFLATION 14GA 120MM (NEEDLE) ×2 IMPLANT
PAD POSITIONING PINK XL (MISCELLANEOUS) ×2 IMPLANT
PROTECTOR NERVE ULNAR (MISCELLANEOUS) ×4 IMPLANT
RELOAD STAPLE 45 2.6 WHT THIN (STAPLE) IMPLANT
SEAL CANN UNIV 5-8 DVNC XI (MISCELLANEOUS) ×4 IMPLANT
SEAL XI 5MM-8MM UNIVERSAL (MISCELLANEOUS) ×4
SET TUBE SMOKE EVAC HIGH FLOW (TUBING) ×2 IMPLANT
SOLUTION ELECTROLUBE (MISCELLANEOUS) ×2 IMPLANT
SPIKE FLUID TRANSFER (MISCELLANEOUS) ×2 IMPLANT
SPONGE GAUZE 2X2 STER 10/PKG (GAUZE/BANDAGES/DRESSINGS) ×1
STAPLE RELOAD 45 WHT (STAPLE) IMPLANT
STAPLE RELOAD 45MM WHITE (STAPLE)
SURGIFLO W/THROMBIN 8M KIT (HEMOSTASIS) ×1 IMPLANT
SUT ETHILON 3 0 PS 1 (SUTURE) ×3 IMPLANT
SUT MNCRL AB 4-0 PS2 18 (SUTURE) ×4 IMPLANT
SUT PDS AB 0 CT1 36 (SUTURE) IMPLANT
SUT PROLENE 4 0 RB 1 (SUTURE) ×1
SUT PROLENE 4-0 RB1 .5 CRCL 36 (SUTURE) ×1 IMPLANT
SUT VIC AB 2-0 SH 27 (SUTURE) ×7
SUT VIC AB 2-0 SH 27XBRD (SUTURE) ×6 IMPLANT
SUT VICRYL 0 UR6 27IN ABS (SUTURE) IMPLANT
SUT VLOC BARB 180 ABS3/0GR12 (SUTURE) ×4
SUTURE VLOC BRB 180 ABS3/0GR12 (SUTURE) ×2 IMPLANT
SYS BAG RETRIEVAL 10MM (BASKET) ×2
SYSTEM BAG RETRIEVAL 10MM (BASKET) ×1 IMPLANT
TOWEL OR 17X26 10 PK STRL BLUE (TOWEL DISPOSABLE) ×2 IMPLANT
TOWEL OR NON WOVEN STRL DISP B (DISPOSABLE) ×2 IMPLANT
TRAY FOLEY MTR SLVR 16FR STAT (SET/KITS/TRAYS/PACK) ×2 IMPLANT
TRAY LAPAROSCOPIC (CUSTOM PROCEDURE TRAY) ×2 IMPLANT
TROCAR Z THREAD OPTICAL 12X100 (TROCAR) IMPLANT
TROCAR Z-THREAD OPTICAL 5X100M (TROCAR) IMPLANT
WATER STERILE IRR 1000ML POUR (IV SOLUTION) ×2 IMPLANT

## 2021-07-25 NOTE — Anesthesia Procedure Notes (Signed)
Procedure Name: Intubation Date/Time: 07/25/2021 11:26 AM  Performed by: Deliah Boston, CRNAPre-anesthesia Checklist: Patient identified, Emergency Drugs available, Suction available and Patient being monitored Patient Re-evaluated:Patient Re-evaluated prior to induction Oxygen Delivery Method: Circle system utilized Preoxygenation: Pre-oxygenation with 100% oxygen Induction Type: IV induction Ventilation: Mask ventilation without difficulty Laryngoscope Size: Miller and 2 Grade View: Grade I Tube type: Oral Tube size: 7.0 mm Number of attempts: 1 Airway Equipment and Method: Stylet and Oral airway Placement Confirmation: ETT inserted through vocal cords under direct vision, positive ETCO2 and breath sounds checked- equal and bilateral Secured at: 21 cm Tube secured with: Tape Dental Injury: Teeth and Oropharynx as per pre-operative assessment

## 2021-07-25 NOTE — Transfer of Care (Signed)
Immediate Anesthesia Transfer of Care Note  Patient: Joann Bond  Procedure(s) Performed: Procedure(s) with comments: XI ROBOTIC ASSITED  LAPAROSCOPIC PARTIAL NEPHRECTOMY (Right) - ONLY NEEDS 240 MIN  Patient Location: PACU  Anesthesia Type:General  Level of Consciousness: Patient easily awoken, sedated, comfortable, cooperative, following commands, responds to stimulation.   Airway & Oxygen Therapy: Patient spontaneously breathing, ventilating well, oxygen via simple oxygen mask.  Post-op Assessment: Report given to PACU RN, vital signs reviewed and stable, moving all extremities.   Post vital signs: Reviewed and stable.  Complications: No apparent anesthesia complications  Last Vitals:  Vitals Value Taken Time  BP 127/70 07/25/21 1515  Temp    Pulse 79 07/25/21 1516  Resp 31 07/25/21 1516  SpO2 100 % 07/25/21 1516  Vitals shown include unvalidated device data.  Last Pain:  Vitals:   07/25/21 1021  TempSrc:   PainSc: 3       Patients Stated Pain Goal: 2 (44/81/[id number])  Complications: No notable events documented.

## 2021-07-25 NOTE — Anesthesia Postprocedure Evaluation (Signed)
Anesthesia Post Note  Patient: Joann Bond  Procedure(s) Performed: XI ROBOTIC ASSITED  LAPAROSCOPIC PARTIAL NEPHRECTOMY (Right)     Patient location during evaluation: PACU Anesthesia Type: General Level of consciousness: awake and alert, oriented and patient cooperative Pain management: pain level controlled Vital Signs Assessment: post-procedure vital signs reviewed and stable Respiratory status: spontaneous breathing, nonlabored ventilation and respiratory function stable Cardiovascular status: blood pressure returned to baseline and stable Postop Assessment: no apparent nausea or vomiting Anesthetic complications: no   No notable events documented.  Last Vitals:  Vitals:   07/25/21 1515 07/25/21 1530  BP:    Pulse:    Resp: 17   Temp:  (!) 36.1 C  SpO2:      Last Pain:  Vitals:   07/25/21 1021  TempSrc:   PainSc: [employer]

## 2021-07-25 NOTE — Discharge Instructions (Signed)

## 2021-07-25 NOTE — Op Note (Signed)
Operative Note  Preoperative diagnosis:  1.  Right renal mass  Postoperative diagnosis: 1.  Right renal mass  Procedure(s): 1. Right robotic assisted laparoscopic partial nephrectomy 2. Intraoperative ultrasound of single retroperitoneal organ with intraoperative interpretation  Surgeon: Rexene Alberts, MD  Assistants:  Debbrah Alar, PGY-4  Anesthesia:  General  Complications:  None  EBL:  42m   Specimens: 1.  ID Type Source Tests Collected by Time Destination  1 : Right Renal Mass Tissue PATH GU resection / TURBT / partial nephrectomy SURGICAL PATHOLOGY GJanith Lima MD 07/25/2021 1235     Drains/Catheters: 1.  15 Fr blake drain  Intraoperative findings:   Approximately 2cm right lower pole renal mass. 1 main artery. 1 small upper pole accessory artery. Branching renal vein at least 4 branches. Excellent hemostasis after removal of mass and renorrhaphy.   Indication:  Joann Bond a 56y.o. female with a right renal mass seen on preoperative imaging.  After thorough discussion including all relevant risk benefits and alternatives, the patient presents to the operating today for a right robotic assisted laparoscopic partial nephrectomy.  Description of procedure: The indications, alternatives, benefits and risks were discussed with the patient and informed consent was obtained.  The patient was brought onto the operating room table, positioned supine and secured to the bed with a safety strap.  All pressure points were carefully padded and pneumatic compression devices were placed on the lower extremities.  After the administration of intravenous antibiotics and general endotracheal anesthesia, a 16 French urethral catheter was inserted to drain the bladder.  The patient was repositioned in the left lateral decubitus with the right side elevated at a 90 degree angle with the left lower leg flexed and the right upper leg extended.  An axillary roll was positioned to protect the  brachial plexus and a gel pad was placed to support the back.  Multiple pillows were used to pad beneath and between the lower extremities and to ensure adequate cushioning. The right arm was placed in an armrest and carefully padded. The patient was secured in place across the hips, chest and legs with foam padding and silk tape, and the table was flexed.  The patient was prepped and draped in the standard sterile manner.  The radiographic images were in the room.  Timeout was completed verifying the correct patient, surgical procedure, site and positioning, prior to beginning the procedure.  Pneumoperitoneum was introduced by placing a Veress needle just lateral to the rectus belly into the abdomen and insufflating with CO2 to a pressure of 15 mmHg.  The camera trocar was placed approximately 7-1/2 cm inferior and 2 cm medially to the planned position of the right robotic arm which was approximately 2 fingerbreadths inferior to the costal margin lateral to the rectus muscle.  The 0 degree lens was inserted under direct visualization.  The abdominal cavity was examined for any signs of injury, adhesions and identification of anatomic landmarks. We then placed our right robotic trocar, left robotic trocar and fourth arm trocar.  A 12 mm assistant port was placed periumbilically.  The robot was then docked.  The white line of Toldt was incised and the colon was reflected medially, exposing Gerota's fascia. Of note, given prior spine surgery from an anterior approach, these planes were fused. I did take down some adhesions. The hepatic flexure was mobilized, allowing for retraction of the liver. The duodenum was kocherized and the inferior vena cava was visualized. The retroperitoneal fascia overlying the  renal vessels was carefully separated, exposing the underlying renal vein. The main renal artery was identified deep to the vein and carefully dissected. A vessel loop was placed around the right renal artery and  secured with a Weck clip. I also dissected a very small right upper pole renal artery.  The renal mass was identified at the right lower pole.  The overlying Gerota's fascia and fat was removed.  Intraoperative ultrasound was used to facilitate identification of the mass and its margins.  A circumferential margin around the mass was scored with electrocauterization.  The laparoscopic bulldog clamps 1 curved  were then placed on the right renal artery and a timer tracking the ischemia time was started.  The renal mass was sharply excised and placed aside in a specimen retrieval bag.  Next, 2 separate 3-0 V-Lock sutures are used to control the transected parenchymal vessels in the resection bed.  The bulldog clamp was then released.  There was a total ischemic time of 9 minutes.  Next, 2-0 Vicryl sutures on an SH needle were then used to reconstruct the renal parenchyma in a sliding Weck fashion.  6 sutures were used.  These were then secured with Weck clips and a Lapra-Ty.  Hemostasis was excellent.  There is no further bleeding.  We then use Vistaseal over the renorrhaphy.  The anterior fascia was reapproximated using weck clips.  The operative field was inspected for bleeding or injury.  The insufflation pressure was reduced, again confirming the absence of bleeding.  Pneumoperitoneum was reestablished and a Blake drain was placed through a laparoscopic port into the perirenal space, away from the repair, and secured at the skin.  The robot was undocked and removed from the operative field.  The trochars were removed under direct visualization.  The periumbilical incision was extended and the specimen retrieval bag was removed.  The specimen was sent to pathology for evaluation.  The anterior fascia at the periumbilical site was carefully closed with UR 6 Vicryl sutures and a total of 40 mL of Exparel was injected subcutaneously at the port sites.  The skin incisions were closed with 4-0 Monocryl sutures.   Dermabond was applied.  The patient was repositioned supine.  At the end of the procedure, all counts were correct.  The patient tolerated the procedure well and was taken to recovery in stable condition.  Plan:  Admit overnight. Clears tonight. Advance as tolerated. Discussed findings with her mom.  Matt R. [facility]  Pager: [phone]

## 2021-07-25 NOTE — Anesthesia Preprocedure Evaluation (Addendum)
Anesthesia Evaluation  Patient identified by MRN, date of birth, ID band Patient awake    Reviewed: Allergy & Precautions, H&P , NPO status , Patient's Chart, lab work & pertinent test results  Airway Mallampati: II  TM Distance: >3 FB Neck ROM: Full    Dental no notable dental hx. (+) Teeth Intact, Dental Advisory Given   Pulmonary asthma ,    Pulmonary exam normal breath sounds clear to auscultation       Cardiovascular hypertension, Pt. on medications  Rhythm:Regular Rate:Normal     Neuro/Psych negative neurological ROS  negative psych ROS   GI/Hepatic negative GI ROS, Neg liver ROS,   Endo/Other  negative endocrine ROS  Renal/GU Renal disease  negative genitourinary   Musculoskeletal   Abdominal   Peds  Hematology negative hematology ROS (+)   Anesthesia Other Findings   Reproductive/Obstetrics negative OB ROS                            Anesthesia Physical Anesthesia Plan  ASA: 2  Anesthesia Plan: General   Post-op Pain Management: Tylenol PO (pre-op)*, Ketamine IV*, Lidocaine infusion* and Gabapentin PO (pre-op)*   Induction: Intravenous  PONV Risk Score and Plan: 4 or greater and Ondansetron, Dexamethasone and Midazolam  Airway Management Planned: Oral ETT  Additional Equipment:   Intra-op Plan:   Post-operative Plan: Extubation in OR  Informed Consent: I have reviewed the patients History and Physical, chart, labs and discussed the procedure including the risks, benefits and alternatives for the proposed anesthesia with the patient or authorized representative who has indicated his/her understanding and acceptance.     Dental advisory given  Plan Discussed with: CRNA  Anesthesia Plan Comments:        Anesthesia Quick Evaluation

## 2021-07-25 NOTE — H&P (Addendum)
Office Visit Report     06/27/2021   --------------------------------------------------------------------------------   Joann Bond  MRN: [id number]  DOB: [birth year], 56 year old Female  SSN:    PRIMARY CARE:  Janith Lima, MD  REFERRING:  Janith Lima, MD  PROVIDER:  Rexene Alberts, M.D.  TREATING:  Jiles Crocker, NP  LOCATION:  [facility] [phone]     --------------------------------------------------------------------------------   CC/HPI: Joann Bond is a 56 year old female seen in consultation today for a complex right renal cyst.   #1. Complex right renal cyst:  -She does have a history of CKD however recent creatinine was 0.9 in 03/2021. Renal ultrasound 01/2020 demonstrated unremarkable kidneys. She had an MRI of the abdomen on 09/16/2020 for renovascular hypertension that revealed a 1.5 cm exophytic right inferior renal pole lesion that was incompletely characterized. Follow-up MRI abdomen 09/2020 demonstrated 1.4 cm Bosniak category 83F cystic lesion.  -Most recent MRI 04/07/2021 demonstrated 1.9 cm complex cystic lesion in the right lower pole consistent with a Bosniak category 3 lesion.  -She denies abdominal pain or flank pain. She denies gross hematuria.  -Creatinine 03/2021 was 0.99   Patient currently denies fever, chills, sweats, nausea, vomiting, abdominal or flank pain, gross hematuria or dysuria.   She does have a history of osteoporosis, hypertension, CKD stage IIIa, osteomalacia.  She has had 2 separate spinal fusions for scoliosis. The approach has been anterior and as result has a lower midline abdominal incision and a left lower quadrant abdominal incision.   06/27/2021: Pt here today for pre-operative appointment prior to undergoing right robot assisted laparoscopic partial nephrectomy on 7/21 with Dr. Abner Greenspan. She denies any changes in past medical history, prescription medications taken on daily basis, no interval surgical or procedural  intervention. She continues to void at her baseline without bothersome increase in frequency/urgency, no interval dysuria or gross hematuria, no interval treatment for UTI. She denies any unilateral lower back, flank or abdominal pain suggestive of obstructive uropathy. She is tolerating a normal diet without nausea or vomiting, endorses having normal bowel movements. Denies any recent fevers or chills. Denies any recent chest pain, shortness of breath, lightheadedness or dizziness, numbness or tingling in the extremities.     ALLERGIES: Sulfa    MEDICATIONS: Bystolic 5 mg tablet  Gabapentin 100 mg capsule 1 capsule PO BID  Norvasc 5 mg tablet  Oxycodone Hcl 5 mg tablet  Tizanidine Hcl  Valsartan 80 mg tablet     GU PSH: No GU PSH      PSH Notes: spinal fusion x4, eye surgery x2, tonsillectomy   NON-GU PSH: No Non-GU PSH    GU PMH: Renal cyst - 05/07/2021    NON-GU PMH: Adult osteomalacia, unspecified Arthritis Asthma Hypercholesterolemia Hypertension    FAMILY HISTORY: No Family History    SOCIAL HISTORY: Marital Status: Single Preferred Language: English; Ethnicity: Not Hispanic Or Latino; Race: White Current Smoking Status: Patient has never smoked.   Tobacco Use Assessment Completed: Used Tobacco in last 30 days? Has never drank.  Drinks 1 caffeinated drink per day. Patient's occupation Journalist, newspaper.    REVIEW OF SYSTEMS:    GU Review Female:   Patient reports get up at night to urinate. Patient denies frequent urination, hard to postpone urination, burning /pain with urination, leakage of urine, stream starts and stops, trouble starting your stream, have to strain to urinate, and being pregnant.  Gastrointestinal (Upper):   Patient denies nausea, vomiting, and indigestion/ heartburn.  Gastrointestinal (Lower):  Patient denies diarrhea and constipation.  Constitutional:   Patient denies fever, night sweats, weight loss, and fatigue.  Skin:   Patient denies  itching and skin rash/ lesion.  Eyes:   Patient denies blurred vision and double vision.  Ears/ Nose/ Throat:   Patient denies sore throat and sinus problems.  Hematologic/Lymphatic:   Patient denies swollen glands and easy bruising.  Cardiovascular:   Patient reports leg swelling. Patient denies chest pains.  Respiratory:   Patient denies cough and shortness of breath.  Endocrine:   Patient denies excessive thirst.  Musculoskeletal:   Patient reports back pain and joint pain.   Neurological:   Patient reports headaches. Patient denies dizziness.  Psychologic:   Patient denies depression and anxiety.   Notes: Reviewed previous review of systems 05/07/2021. No changes.   VITAL SIGNS:      06/27/2021 09:49 AM  Weight 150 lb / 68.04 kg  Height 63 in / 160.02 cm  BP 134/84 mmHg  Heart Rate 62 /min  Temperature 97.7 F / 36.5 C  BMI 26.6 kg/m   MULTI-SYSTEM PHYSICAL EXAMINATION:    Constitutional: Well-nourished. No physical deformities. Normally developed. Good grooming.  Neck: Neck symmetrical, not swollen. Normal tracheal position.  Respiratory: No labored breathing, no use of accessory muscles.   Cardiovascular: Normal temperature, normal extremity pulses, no swelling, no varicosities.  Skin: No paleness, no jaundice, no cyanosis. No lesion, no ulcer, no rash.  Neurologic / Psychiatric: Oriented to time, oriented to place, oriented to person. No depression, no anxiety, no agitation.  Gastrointestinal: Obese abdomen. No mass, no tenderness, no rigidity. Midline abdominal incision and left lower quadrant incision, well healed  Musculoskeletal: Normal gait and station of head and neck.     Complexity of Data:  Source Of History:  Patient, Medical Record Summary  Records Review:   Previous Doctor Records, Previous Hospital Records, Previous Patient Records  Urine Test Review:   Urinalysis  X-Ray Review: C.T. Abdomen/Pelvis: Reviewed Films. Reviewed Report.     PROCEDURES: None    ASSESSMENT:      ICD-10 Details  1 GU:   Renal cyst - N28.1 Chronic, Threat to Bodily Function  2 NON-GU:   Encounter for other preprocedural examination - Z01.818 Undiagnosed New Problem   PLAN:           Document Letter(s):  Created for Patient: Clinical Summary         Notes:   Patient sent to lab to provide voided urine specimen prior to leaving today.   All questions answered to the best of my ability regarding upcoming procedure and expected postoperative course with understanding expressed by the patient. Urine culture sent today to serve as a baseline. Patient will proceed with previously right robot assisted laparoscopic partial nephrectomy on 07/21 with Dr Abner Greenspan.   Pt did not leave voided urine specimen prior to leaving office today. Not exhibiting any signs/symptoms of cystitis. UA at prior office visit was without microscopic abnormality. I canceled the urine culture order.        Next Appointment:      Next Appointment: 07/25/2021 11:30 AM    Appointment Type: Surgery     Location: [facility] [phone]    Provider: Rexene Alberts, M.D.    Reason for Visit: WL/EXT REC RT RA LAP [place]    Urology Preoperative H&P   Chief Complaint: Right complex renal cyst  History of Present Illness: Joann Bond is a 56 y.o.  female with a right complex renal cyst here for right robotic partial nephrectomy. Denies fevers, chills, dysuria.   Past Medical History:  Diagnosis Date   Asthma    Chronic kidney disease    right kidney mass   Hypertension    Pneumonia    many years ago    Past Surgical History:  Procedure Laterality Date   EYE SURGERY     SPINAL FUSION     Scoliosis/ age 43   SPINAL FUSION     spinal stenosis age 58   TONSILLECTOMY      Allergies:  Allergies  Allergen Reactions   Bactrim [Sulfamethoxazole-Trimethoprim] Hives    Family History  Problem Relation Age of Onset   Cancer Mother    Hypertension Mother     Breast cancer Mother     Social History:  reports that she has never smoked. She has never used smokeless tobacco. She reports that she does not drink alcohol and does not use drugs.  ROS: A complete review of systems was performed.  All systems are negative except for pertinent findings as noted.  Physical Exam:  Vital signs in last 24 hours:   Constitutional:  Alert and oriented, No acute distress Cardiovascular: Regular rate and rhythm Respiratory: Normal respiratory effort, Lungs clear bilaterally GI: Abdomen is soft, nontender, nondistended, no abdominal masses GU: No CVA tenderness Lymphatic: No lymphadenopathy Neurologic: Grossly intact, no focal deficits Psychiatric: Normal mood and affect  Laboratory Data:  No results for input(s): "WBC", "HGB", "HCT", "PLT" in the last 72 hours.  No results for input(s): "NA", "K", "CL", "GLUCOSE", "BUN", "CALCIUM", "CREATININE" in the last 72 hours.  Invalid input(s): "CO3"   No results found for this or any previous visit (from the past 24 hour(s)). No results found for this or any previous visit (from the past 240 hour(s)).  Renal Function: No results for input(s): "CREATININE" in the last 168 hours. Estimated Creatinine Clearance: 53.2 mL/min (A) (by C-G formula based on SCr of 1.08 mg/dL (H)).  Radiologic Imaging: No results found.  I independently reviewed the above imaging studies.  Assessment and Plan Avayah Raffety is a 56 y.o. female with a right complex renal cyst here for right robotic assisted laparoscopic partial nephrectomy.   We discussed the fact that about 55% of Bosniak category 3 cystic lesions are cancerous. We discussed that the primary treatment option is surgical excision via either open or laparoscopic approach. Other treatment options discussed include ablative therapies (cryoablation v radiofrequency ablation) and active surveillance. At this point in time, the patient has elected to proceed with right  robotic assisted laparoscopic partial nephrectomy.   The patient was provided information regarding their renal mass including the relative risk of benign versus malignant pathology and the natural history of renal cell carcinoma and other possible malignancies of the kidney. The role of renal biopsy, laboratory testing, and imaging studies to further characterize renal masses and/or the presence of metastatic disease were explained. We discussed the role of active surveillance, surgical therapy with both radical nephrectomy and nephron-sparing surgery, and ablative therapy in the treatment of renal masses. In addition, we discussed our goals of providing an accurate diagnosis and oncologic control while maintaining optimal renal function as appropriate based on the size, location, and complexity of their renal mass as well as their co-morbidities.   We have discussed the risks of treatment in detail including but not limited to bleeding, infection, heart attack, stroke, death, venothromoboembolism, cancer recurrence, injury/damage to surrounding organs  and structures, urine leak, the possibility of open surgical conversion for patients undergoing minimally invasive surgery, the risk of developing chronic kidney disease and its associated implications, and the potential risk of end stage renal disease possibly necessitating dialysis.   Matt R. Chanell Nadeau MD 07/25/2021, 9:23 AM  [facility] Pager: [phone]

## 2021-07-26 ENCOUNTER — Encounter (HOSPITAL_COMMUNITY): Payer: Self-pay | Admitting: Urology

## 2021-07-26 ENCOUNTER — Other Ambulatory Visit (HOSPITAL_COMMUNITY): Payer: Self-pay

## 2021-07-26 DIAGNOSIS — J45909 Unspecified asthma, uncomplicated: Secondary | ICD-10-CM | POA: Diagnosis not present

## 2021-07-26 DIAGNOSIS — C641 Malignant neoplasm of right kidney, except renal pelvis: Secondary | ICD-10-CM | POA: Diagnosis not present

## 2021-07-26 DIAGNOSIS — N1831 Chronic kidney disease, stage 3a: Secondary | ICD-10-CM | POA: Diagnosis not present

## 2021-07-26 DIAGNOSIS — I129 Hypertensive chronic kidney disease with stage 1 through stage 4 chronic kidney disease, or unspecified chronic kidney disease: Secondary | ICD-10-CM | POA: Diagnosis not present

## 2021-07-26 DIAGNOSIS — N281 Cyst of kidney, acquired: Secondary | ICD-10-CM | POA: Diagnosis not present

## 2021-07-26 DIAGNOSIS — Z79899 Other long term (current) drug therapy: Secondary | ICD-10-CM | POA: Diagnosis not present

## 2021-07-26 LAB — BASIC METABOLIC PANEL
Anion gap: 9 (ref 5–15)
BUN: 17 mg/dL (ref 6–20)
CO2: 26 mmol/L (ref 22–32)
Calcium: 9.4 mg/dL (ref 8.9–10.3)
Chloride: 103 mmol/L (ref 98–111)
Creatinine, Ser: 0.84 mg/dL (ref 0.44–1.00)
GFR, Estimated: >60 mL/min (ref >60)
Glucose, Bld: 165 mg/dL — ABNORMAL HIGH (ref 70–99)
Potassium: 4.5 mmol/L (ref 3.5–5.1)
Sodium: 138 mmol/L (ref 135–145)

## 2021-07-26 LAB — CREATININE, FLUID (PLEURAL, PERITONEAL, JP DRAINAGE): Creat, Fluid: 0.8 mg/dL

## 2021-07-26 LAB — HEMOGLOBIN AND HEMATOCRIT, BLOOD
HCT: 36.3 % (ref 36.0–46.0)
Hemoglobin: 12 g/dL (ref 12.0–15.0)

## 2021-07-26 MED ORDER — ALBUTEROL SULFATE (2.5 MG/3ML) 0.083% IN NEBU
2.5000 mg | INHALATION_SOLUTION | RESPIRATORY_TRACT | Status: DC | PRN
  Administered 2021-07-26: 2.5 mg via RESPIRATORY_TRACT
  Filled 2021-07-26: qty 3

## 2021-07-26 MED ORDER — CHLORHEXIDINE GLUCONATE CLOTH 2 % EX PADS: 6.0000 | MEDICATED_PAD | Freq: Every day | CUTANEOUS | Status: DC

## 2021-07-26 NOTE — Plan of Care (Signed)
Problem: Education: Goal: Knowledge of General Education information will improve Description: Including pain rating scale, medication(s)/side effects and non-pharmacologic comfort measures 07/26/2021 1547 by Vicki Mallet, RN Outcome: Adequate for Discharge 07/26/2021 1547 by Vicki Mallet, RN Outcome: Progressing   Problem: Health Behavior/Discharge Planning: Goal: Ability to manage health-related needs will improve 07/26/2021 1547 by Vicki Mallet, RN Outcome: Adequate for Discharge 07/26/2021 1547 by Vicki Mallet, RN Outcome: Progressing   Problem: Clinical Measurements: Goal: Ability to maintain clinical measurements within normal limits will improve 07/26/2021 1547 by Vicki Mallet, RN Outcome: Adequate for Discharge 07/26/2021 1547 by Vicki Mallet, RN Outcome: Progressing Goal: Will remain free from infection 07/26/2021 1547 by Vicki Mallet, RN Outcome: Adequate for Discharge 07/26/2021 1547 by Vicki Mallet, RN Outcome: Progressing 07/26/2021 1406 by Vicki Mallet, RN Outcome: Progressing Goal: Diagnostic test results will improve 07/26/2021 1547 by Vicki Mallet, RN Outcome: Adequate for Discharge 07/26/2021 1547 by Vicki Mallet, RN Outcome: Progressing 07/26/2021 1406 by Vicki Mallet, RN Outcome: Progressing Goal: Respiratory complications will improve 07/26/2021 1547 by Vicki Mallet, RN Outcome: Adequate for Discharge 07/26/2021 1547 by Vicki Mallet, RN Outcome: Progressing Goal: Cardiovascular complication will be avoided 07/26/2021 1547 by Vicki Mallet, RN Outcome: Adequate for Discharge 07/26/2021 1547 by Vicki Mallet, RN Outcome: Progressing   Problem: Activity: Goal: Risk for activity intolerance will decrease 07/26/2021 1547 by Vicki Mallet, RN Outcome: Adequate for Discharge 07/26/2021 1547 by Vicki Mallet, RN Outcome: Progressing 07/26/2021 1406 by Vicki Mallet, RN Outcome: Progressing   Problem: Nutrition: Goal: Adequate nutrition will be maintained 07/26/2021 1547 by Vicki Mallet, RN Outcome: Adequate for  Discharge 07/26/2021 1547 by Vicki Mallet, RN Outcome: Progressing 07/26/2021 1406 by Vicki Mallet, RN Outcome: Progressing   Problem: Coping: Goal: Level of anxiety will decrease 07/26/2021 1547 by Vicki Mallet, RN Outcome: Adequate for Discharge 07/26/2021 1547 by Vicki Mallet, RN Outcome: Progressing   Problem: Elimination: Goal: Will not experience complications related to bowel motility 07/26/2021 1547 by Vicki Mallet, RN Outcome: Adequate for Discharge 07/26/2021 1547 by Vicki Mallet, RN Outcome: Progressing 07/26/2021 1406 by Vicki Mallet, RN Outcome: Progressing Goal: Will not experience complications related to urinary retention 07/26/2021 1547 by Vicki Mallet, RN Outcome: Adequate for Discharge 07/26/2021 1547 by Vicki Mallet, RN Outcome: Progressing 07/26/2021 1406 by Vicki Mallet, RN Outcome: Progressing   Problem: Pain Managment: Goal: General experience of comfort will improve 07/26/2021 1547 by Vicki Mallet, RN Outcome: Adequate for Discharge 07/26/2021 1547 by Vicki Mallet, RN Outcome: Progressing 07/26/2021 1406 by Vicki Mallet, RN Outcome: Progressing   Problem: Safety: Goal: Ability to remain free from injury will improve 07/26/2021 1547 by Vicki Mallet, RN Outcome: Adequate for Discharge 07/26/2021 1547 by Vicki Mallet, RN Outcome: Progressing 07/26/2021 1406 by Vicki Mallet, RN Outcome: Progressing   Problem: Skin Integrity: Goal: Risk for impaired skin integrity will decrease 07/26/2021 1547 by Vicki Mallet, RN Outcome: Adequate for Discharge 07/26/2021 1547 by Vicki Mallet, RN Outcome: Progressing 07/26/2021 1406 by Vicki Mallet, RN Outcome: Progressing   Problem: Education: Goal: Knowledge of the prescribed therapeutic regimen will improve 07/26/2021 1547 by Vicki Mallet, RN Outcome: Adequate for Discharge 07/26/2021 1547 by Vicki Mallet, RN Outcome: Progressing   Problem: Bowel/Gastric: Goal: Gastrointestinal status for postoperative course will improve 07/26/2021 1547 by Vicki Mallet, RN Outcome: Adequate for  Discharge 07/26/2021 1547 by Vicki Mallet, RN Outcome: Progressing   Problem: Clinical Measurements: Goal: Postoperative complications will be avoided or minimized 07/26/2021 1547 by Vicki Mallet, RN Outcome: Adequate for Discharge 07/26/2021 1547 by Vicki Mallet, RN Outcome:  Progressing   Problem: Respiratory: Goal: Ability to achieve and maintain a regular respiratory rate will improve 07/26/2021 1547 by Vicki Mallet, RN Outcome: Adequate for Discharge 07/26/2021 1547 by Vicki Mallet, RN Outcome: Progressing   Problem: Skin Integrity: Goal: Demonstration of wound healing without infection will improve 07/26/2021 1547 by Vicki Mallet, RN Outcome: Adequate for Discharge 07/26/2021 1547 by Vicki Mallet, RN Outcome: Progressing 07/26/2021 1406 by Vicki Mallet, RN Outcome: Progressing   Problem: Urinary Elimination: Goal: Ability to avoid or minimize complications of infection will improve 07/26/2021 1547 by Vicki Mallet, RN Outcome: Adequate for Discharge 07/26/2021 1547 by Vicki Mallet, RN Outcome: Progressing Goal: Ability to achieve and maintain urine output will improve 07/26/2021 1547 by Vicki Mallet, RN Outcome: Adequate for Discharge 07/26/2021 1547 by Vicki Mallet, RN Outcome: Progressing

## 2021-07-26 NOTE — Progress Notes (Signed)
Urology Progress Note   1 Day Post-Op from right partial nephrectomy.   Subjective: NAEON. Some pain control issues last night but doing well now. JP creatinine pending  Objective: Vital signs in last 24 hours: Temp:  [96.3 F (35.7 C)-98.4 F (36.9 C)] 98.2 F (36.8 C) (07/22 0534) Pulse Rate:  [73-90] 86 (07/22 0534) Resp:  [12-22] 20 (07/22 0534) BP: (107-155)/(64-87) 120/71 (07/22 0534) SpO2:  [91 %-100 %] 93 % (07/22 0534) Weight:  [66.2 kg] 66.2 kg (07/21 1008)  Intake/Output from previous day: 07/21 0701 - 07/22 0700 In: 2808.7 [P.O.:360; I.V.:2348.7; IV Piggyback:100] Out: 1638 [Urine:1650; Drains:90; Blood:25] Intake/Output this shift: Total I/O In: 1124.6 [P.O.:360; I.V.:764.6] Out: 1520 [Urine:1450; Drains:70]  Physical Exam:  General: Alert and oriented CV: Regular rate Lungs: No increased work of breathing Abdomen:  Soft, appropriately tender. Incisions c/d/i. JP SS GU: Foley in place draining clear yellow urine  Ext: NT, No erythema  Lab Results: Recent Labs    07/25/21 1551 07/26/21 0450  HGB 12.9 12.0  HCT 39.2 36.3   Recent Labs    07/26/21 0450  NA 138  K 4.5  CL 103  CO2 26  GLUCOSE 165*  BUN 17  CREATININE 0.84  CALCIUM 9.4    Studies/Results: No results found.  Assessment/Plan:  56 y.o. female s/p right partial nephrectomy.  Overall doing well post-op.   - void trial - remove JP if JP creatinine negative - home later today if feeling well in afternoon - regular diet  Dispo: floor   LOS: 0 days

## 2021-07-26 NOTE — Plan of Care (Signed)

## 2021-07-26 NOTE — Plan of Care (Signed)
  Problem: Clinical Measurements: Goal: Will remain free from infection Outcome: Progressing Goal: Diagnostic test results will improve Outcome: Progressing   Problem: Activity: Goal: Risk for activity intolerance will decrease Outcome: Progressing   Problem: Nutrition: Goal: Adequate nutrition will be maintained Outcome: Progressing   Problem: Elimination: Goal: Will not experience complications related to bowel motility Outcome: Progressing Goal: Will not experience complications related to urinary retention Outcome: Progressing   Problem: Pain Managment: Goal: General experience of comfort will improve Outcome: Progressing   Problem: Safety: Goal: Ability to remain free from injury will improve Outcome: Progressing   Problem: Skin Integrity: Goal: Risk for impaired skin integrity will decrease Outcome: Progressing   Problem: Skin Integrity: Goal: Demonstration of wound healing without infection will improve Outcome: Progressing

## 2021-07-26 NOTE — Discharge Summary (Signed)
[facility] Discharge Summary  Admit date: 07/25/2021  Discharge date and time: 07/26/21   Discharge to: Home  Discharge Service: Urology  Discharge Attending Physician:  Dr. Gloriann Loan  Discharge  Diagnoses: Renal mass  Secondary Diagnosis: Principal Problem:   Renal mass   OR Procedures: Procedure(s): XI ROBOTIC ASSITED  LAPAROSCOPIC PARTIAL NEPHRECTOMY 07/25/2021   Ancillary Procedures: None   Discharge Day Services: The patient was seen and examined by the Urology team both in the morning and immediately prior to discharge.  Vital signs and laboratory values were stable and within normal limits.  The physical exam was benign and unchanged and all surgical wounds were examined.  Discharge instructions were explained and all questions answered.  Subjective  No acute events overnight. Pain Controlled. No fever or chills.  Objective Patient Vitals for the past 8 hrs:  BP Temp Temp src Pulse Resp SpO2  07/26/21 1212 134/77 99.4 F (37.4 C) Oral 86 17 97 %  07/26/21 0909 124/73 98.6 F (37 C) Oral 91 17 95 %   Total I/O In: 480 [P.O.:480] Out: 1975 [HUTML:4650; Drains:25]  General Appearance:        No acute distress Lungs:                       Normal work of breathing on room air Heart:                                Regular rate and rhythm Abdomen:                         Soft, non-tender, non-distended Extremities:                      Warm and well perfused   Hospital Course:  The patient underwent right robotic partial nephrectomy on 07/25/2021.  The patient tolerated the procedure well, was extubated in the OR, and afterwards was taken to the PACU for routine post-surgical care. When stable the patient was transferred to the floor.   The patient did well postoperatively.  The patient's diet was slowly advanced and at the time of discharge was tolerating a regular diet.  The patient was discharged home 1 Day Post-Op, at which point was tolerating a regular solid diet,  was able to void spontaneously, have adequate pain control with P.O. pain medication, and could ambulate without difficulty. The patient will follow up with [place] for post op check.   Condition at Discharge: Improved  Discharge Medications:  Allergies as of 07/26/2021       Reactions   Bactrim [sulfamethoxazole-trimethoprim] Hives        Medication List     TAKE these medications    amLODipine 5 MG tablet Commonly known as: NORVASC Take 1 tablet (5 mg total) by mouth daily.   Cholecalciferol 50 MCG (2000 UT) Tabs Take 2 tablets (4,000 Units total) by mouth daily. What changed:  how much to take when to take this   docusate sodium 100 MG capsule Commonly known as: COLACE Take 1 capsule (100 mg total) by mouth 2 (two) times daily.   gabapentin 300 MG capsule Commonly known as: NEURONTIN Take 1 capsule (300 mg total) by mouth 3 (three) times daily.   nebivolol 5 MG tablet Commonly known as: BYSTOLIC Take 1 tablet (5 mg total) by mouth daily. What changed: when to take  this   oxyCODONE-acetaminophen 5-325 MG tablet Commonly known as: PERCOCET/ROXICET Take 1 to 2 tablets by mouth every 8 hours as needed for moderate pain or severe pain. What changed:  how much to take reasons to take this Another medication with the same name was removed. Continue taking this medication, and follow the directions you see here.   tizanidine 2 MG capsule Commonly known as: ZANAFLEX Take 1 capsule (2 mg total) by mouth 3 (three) times daily. What changed:  when to take this Another medication with the same name was removed. Continue taking this medication, and follow the directions you see here.   valsartan 80 MG tablet Commonly known as: DIOVAN Take 1 tablet (80 mg total) by mouth 2 (two) times daily.

## 2021-07-28 LAB — SURGICAL PATHOLOGY

## 2021-08-04 ENCOUNTER — Other Ambulatory Visit (HOSPITAL_COMMUNITY): Payer: Self-pay

## 2021-08-04 DIAGNOSIS — D49511 Neoplasm of unspecified behavior of right kidney: Secondary | ICD-10-CM | POA: Diagnosis not present

## 2021-08-05 ENCOUNTER — Other Ambulatory Visit (HOSPITAL_COMMUNITY): Payer: Self-pay

## 2021-08-05 DIAGNOSIS — M47816 Spondylosis without myelopathy or radiculopathy, lumbar region: Secondary | ICD-10-CM | POA: Diagnosis not present

## 2021-08-05 DIAGNOSIS — M5136 Other intervertebral disc degeneration, lumbar region: Secondary | ICD-10-CM | POA: Diagnosis not present

## 2021-08-05 DIAGNOSIS — Z79891 Long term (current) use of opiate analgesic: Secondary | ICD-10-CM | POA: Diagnosis not present

## 2021-08-05 DIAGNOSIS — M47814 Spondylosis without myelopathy or radiculopathy, thoracic region: Secondary | ICD-10-CM | POA: Diagnosis not present

## 2021-08-05 DIAGNOSIS — G894 Chronic pain syndrome: Secondary | ICD-10-CM | POA: Diagnosis not present

## 2021-08-05 DIAGNOSIS — M41115 Juvenile idiopathic scoliosis, thoracolumbar region: Secondary | ICD-10-CM | POA: Diagnosis not present

## 2021-08-05 MED ORDER — OXYCODONE-ACETAMINOPHEN 5-325 MG PO TABS
1.0000 | ORAL_TABLET | Freq: Three times a day (TID) | ORAL | 0 refills | Status: DC | PRN
  Filled 2021-10-06: qty 90, 30d supply, fill #0

## 2021-08-05 MED ORDER — OXYCODONE-ACETAMINOPHEN 5-325 MG PO TABS
1.0000 | ORAL_TABLET | Freq: Three times a day (TID) | ORAL | 0 refills | Status: DC | PRN
  Filled 2021-08-05: qty 90, 30d supply, fill #0

## 2021-08-05 MED ORDER — OXYCODONE-ACETAMINOPHEN 5-325 MG PO TABS
1.0000 | ORAL_TABLET | Freq: Three times a day (TID) | ORAL | 0 refills | Status: DC | PRN
  Filled 2021-09-05: qty 90, 30d supply, fill #0

## 2021-08-14 ENCOUNTER — Other Ambulatory Visit (HOSPITAL_COMMUNITY): Payer: Self-pay

## 2021-08-14 ENCOUNTER — Other Ambulatory Visit: Payer: Self-pay | Admitting: Internal Medicine

## 2021-08-14 MED ORDER — NEBIVOLOL HCL 5 MG PO TABS
5.0000 mg | ORAL_TABLET | Freq: Every day | ORAL | 0 refills | Status: DC
  Filled 2021-08-14: qty 30, 30d supply, fill #0

## 2021-09-05 ENCOUNTER — Other Ambulatory Visit (HOSPITAL_COMMUNITY): Payer: Self-pay

## 2021-09-06 ENCOUNTER — Ambulatory Visit (HOSPITAL_COMMUNITY)
Admission: EM | Admit: 2021-09-06 | Discharge: 2021-09-06 | Disposition: A | Discharge status: Home / Self Care | Payer: 59 | Source: Home / Self Care

## 2021-09-06 ENCOUNTER — Encounter (HOSPITAL_COMMUNITY): Payer: Self-pay | Admitting: Emergency Medicine

## 2021-09-06 DIAGNOSIS — Z905 Acquired absence of kidney: Secondary | ICD-10-CM | POA: Diagnosis not present

## 2021-09-06 DIAGNOSIS — I10 Essential (primary) hypertension: Secondary | ICD-10-CM | POA: Diagnosis not present

## 2021-09-06 DIAGNOSIS — E860 Dehydration: Secondary | ICD-10-CM | POA: Insufficient documentation

## 2021-09-06 DIAGNOSIS — R197 Diarrhea, unspecified: Secondary | ICD-10-CM

## 2021-09-06 DIAGNOSIS — G8929 Other chronic pain: Secondary | ICD-10-CM | POA: Diagnosis present

## 2021-09-06 DIAGNOSIS — Z981 Arthrodesis status: Secondary | ICD-10-CM | POA: Diagnosis not present

## 2021-09-06 DIAGNOSIS — N179 Acute kidney failure, unspecified: Secondary | ICD-10-CM | POA: Diagnosis not present

## 2021-09-06 DIAGNOSIS — K5289 Other specified noninfective gastroenteritis and colitis: Secondary | ICD-10-CM | POA: Insufficient documentation

## 2021-09-06 DIAGNOSIS — Z803 Family history of malignant neoplasm of breast: Secondary | ICD-10-CM | POA: Diagnosis not present

## 2021-09-06 DIAGNOSIS — N390 Urinary tract infection, site not specified: Secondary | ICD-10-CM | POA: Diagnosis not present

## 2021-09-06 DIAGNOSIS — Z20822 Contact with and (suspected) exposure to covid-19: Secondary | ICD-10-CM | POA: Diagnosis not present

## 2021-09-06 DIAGNOSIS — Z85528 Personal history of other malignant neoplasm of kidney: Secondary | ICD-10-CM | POA: Diagnosis not present

## 2021-09-06 DIAGNOSIS — K5730 Diverticulosis of large intestine without perforation or abscess without bleeding: Secondary | ICD-10-CM | POA: Diagnosis not present

## 2021-09-06 DIAGNOSIS — N2889 Other specified disorders of kidney and ureter: Secondary | ICD-10-CM | POA: Diagnosis not present

## 2021-09-06 DIAGNOSIS — M549 Dorsalgia, unspecified: Secondary | ICD-10-CM | POA: Diagnosis present

## 2021-09-06 DIAGNOSIS — R112 Nausea with vomiting, unspecified: Secondary | ICD-10-CM

## 2021-09-06 DIAGNOSIS — K529 Noninfective gastroenteritis and colitis, unspecified: Secondary | ICD-10-CM | POA: Diagnosis not present

## 2021-09-06 DIAGNOSIS — Z8249 Family history of ischemic heart disease and other diseases of the circulatory system: Secondary | ICD-10-CM | POA: Diagnosis not present

## 2021-09-06 DIAGNOSIS — Z79899 Other long term (current) drug therapy: Secondary | ICD-10-CM | POA: Diagnosis not present

## 2021-09-06 DIAGNOSIS — M4800 Spinal stenosis, site unspecified: Secondary | ICD-10-CM | POA: Diagnosis not present

## 2021-09-06 LAB — COMPREHENSIVE METABOLIC PANEL
ALT: 19 U/L (ref 0–44)
AST: 18 U/L (ref 15–41)
Albumin: 4.8 g/dL (ref 3.5–5.0)
Alkaline Phosphatase: 79 U/L (ref 38–126)
Anion gap: 13 (ref 5–15)
BUN: 28 mg/dL — ABNORMAL HIGH (ref 6–20)
CO2: 26 mmol/L (ref 22–32)
Calcium: 10.4 mg/dL — ABNORMAL HIGH (ref 8.9–10.3)
Chloride: 101 mmol/L (ref 98–111)
Creatinine, Ser: 1.19 mg/dL — ABNORMAL HIGH (ref 0.44–1.00)
GFR, Estimated: 54 mL/min — ABNORMAL LOW (ref >60)
Glucose, Bld: 135 mg/dL — ABNORMAL HIGH (ref 70–99)
Potassium: 4.5 mmol/L (ref 3.5–5.1)
Sodium: 140 mmol/L (ref 135–145)
Total Bilirubin: 1 mg/dL (ref 0.3–1.2)
Total Protein: 7.8 g/dL (ref 6.5–8.1)

## 2021-09-06 LAB — CBC WITH DIFFERENTIAL/PLATELET
Abs Immature Granulocytes: 0.05 10*3/uL (ref 0.00–0.07)
Basophils Absolute: 0 10*3/uL (ref 0.0–0.1)
Basophils Relative: 0 %
Eosinophils Absolute: 0.2 10*3/uL (ref 0.0–0.5)
Eosinophils Relative: 2 %
HCT: 41.1 % (ref 36.0–46.0)
Hemoglobin: 13.4 g/dL (ref 12.0–15.0)
Immature Granulocytes: 0 %
Lymphocytes Relative: 14 %
Lymphs Abs: 1.5 10*3/uL (ref 0.7–4.0)
MCH: 29.6 pg (ref 26.0–34.0)
MCHC: 32.6 g/dL (ref 30.0–36.0)
MCV: 90.7 fL (ref 80.0–100.0)
Monocytes Absolute: 1.3 10*3/uL — ABNORMAL HIGH (ref 0.1–1.0)
Monocytes Relative: 11 %
Neutro Abs: 8.2 10*3/uL — ABNORMAL HIGH (ref 1.7–7.7)
Neutrophils Relative %: 73 %
Platelets: 368 10*3/uL (ref 150–400)
RBC: 4.53 MIL/uL (ref 3.87–5.11)
RDW: 14.2 % (ref 11.5–15.5)
WBC: 11.3 10*3/uL — ABNORMAL HIGH (ref 4.0–10.5)
nRBC: 0 % (ref 0.0–0.2)

## 2021-09-06 MED ORDER — ONDANSETRON 4 MG PO TBDP
4.0000 mg | ORAL_TABLET | Freq: Once | ORAL | Status: AC
  Administered 2021-09-06: 4 mg via ORAL

## 2021-09-06 MED ORDER — ONDANSETRON HCL 4 MG PO TABS: 4.0000 mg | ORAL_TABLET | Freq: Three times a day (TID) | ORAL | 0 refills | Status: DC | PRN

## 2021-09-06 MED ORDER — ONDANSETRON 4 MG PO TBDP
ORAL_TABLET | ORAL | Status: AC
  Filled 2021-09-06: qty 1

## 2021-09-06 NOTE — ED Triage Notes (Signed)
Pt had partial nephrostomy 6 weeks ago. Reports had lots of gas and was told that was normal. Reports past 2 days having vomiting and diarrhea. Reports after having diarrhea BMs her stomach will hurt. Reports that stomach will get really tight. Reports symptoms stop if doesn't eat or drink anything.

## 2021-09-06 NOTE — Discharge Instructions (Signed)
Advised take the Zofran every 6 hours on a regular basis to see if this will calm down the nausea, vomiting and loose stools. Advised to take in clear liquids with Sprite-ginger ale-7-Up-chicken broth-bland diet. Advised to follow-up with PCP if symptoms fail to improve in the next 48 to 72 hours for further evaluation if needed.

## 2021-09-06 NOTE — ED Provider Notes (Signed)
[place]    CSN: [id number] Arrival date & time: 09/06/21  1321      History   Chief Complaint Chief Complaint  Patient presents with   Diarrhea   Emesis    HPI Joann Bond is a 56 y.o. female.   56 year old female presents with nausea vomiting and diarrhea.  Patient indicates that she had a right nephrectomy 6 weeks ago for CA.  She was advised that she would have increased gastric gas for several weeks after having the surgery which she has had.  Patient relates that over the past 2 days she has started having nausea, stomach cramping and repeated vomiting with diarrhea and frequent loose stools.  Patient relates that she has not had any fever or chills.  She indicates that she has not been around any family or friends that have been sick with similar symptoms.  Patient indicates that she has not eaten anything unusual that may not have been cooked correctly.  Patient denies any blood in the stools.  She indicates being concerned due to her recent surgery and her symptoms particularly starting with the nausea and vomiting.   Diarrhea Associated symptoms: vomiting   Emesis Associated symptoms: diarrhea     Past Medical History:  Diagnosis Date   Asthma    Chronic kidney disease    right kidney mass   Hypertension    Pneumonia    many years ago    Patient Active Problem List   Diagnosis Date Noted   Renal mass 07/25/2021   Right renal mass 09/17/2020   Age-related osteoporosis with current pathological fracture 01/24/2020   Primary hypertension 01/24/2020   Encounter for general adult medical examination with abnormal findings 01/24/2020   Stage 3a chronic kidney disease ([place]) 01/24/2020   Essential hypertension 08/16/2019   Spinal stenosis 08/16/2019    Past Surgical History:  Procedure Laterality Date   EYE SURGERY     ROBOTIC ASSITED PARTIAL NEPHRECTOMY Right 07/25/2021   Procedure: XI ROBOTIC ASSITED  LAPAROSCOPIC PARTIAL NEPHRECTOMY;  Surgeon:  Janith Lima, MD;  Location: [facility];  Service: Urology;  Laterality: Right;  ONLY NEEDS 240 MIN   SPINAL FUSION     Scoliosis/ age 35   SPINAL FUSION     spinal stenosis age 42   TONSILLECTOMY      OB History     Gravida  0   Para  0   Term  0   Preterm  0   AB  0   Living  0      SAB  0   IAB  0   Ectopic  0   Multiple  0   Live Births  0            Home Medications    Prior to Admission medications   Medication Sig Start Date End Date Taking? Authorizing Provider  ondansetron (ZOFRAN) 4 MG tablet Take 1 tablet (4 mg total) by mouth every 8 (eight) hours as needed for nausea or vomiting. 09/06/21  Yes Nyoka Lint, PA-C  amLODipine (NORVASC) 5 MG tablet Take 1 tablet (5 mg total) by mouth daily. 03/19/21   Janith Lima, MD  Cholecalciferol 50 MCG (2000 UT) TABS Take 2 tablets (4,000 Units total) by mouth daily. Patient taking differently: Take 2,000 Units by mouth 2 (two) times a week. 03/19/21   Janith Lima, MD  docusate sodium (COLACE) 100 MG capsule Take 1 capsule (100 mg total) by mouth 2 (two)  times daily. 07/25/21   Debbrah Alar, PA-C  gabapentin (NEURONTIN) 300 MG capsule Take 1 capsule (300 mg total) by mouth 3 (three) times daily. 07/15/21     nebivolol (BYSTOLIC) 5 MG tablet Take 1 tablet (5 mg total) by mouth daily. 08/14/21   Janith Lima, MD  oxyCODONE-acetaminophen (PERCOCET/ROXICET) 5-325 MG tablet Take 1 to 2 tablets by mouth every 8 hours as needed for moderate pain or severe pain. 07/25/21   Debbrah Alar, PA-C  oxyCODONE-acetaminophen (PERCOCET/ROXICET) 5-325 MG tablet Take 1 tablet by mouth every 8 (eight) hours as needed for pain for up to 30 days. (max daily amount 3 tablets) 10/04/21     oxyCODONE-acetaminophen (PERCOCET/ROXICET) 5-325 MG tablet Take 1 tablet by mouth every 8 (eight) hours as needed for pain for up to 30 days. Max daily amount 3 tablets 09/04/21     oxyCODONE-acetaminophen (PERCOCET/ROXICET) 5-325 MG tablet Take 1  tablet by mouth every 8 (eight) hours as needed for pain for up to 30 days. Max daily amount 3 tablets 08/05/21     tizanidine (ZANAFLEX) 2 MG capsule Take 1 capsule (2 mg total) by mouth 3 (three) times daily. Patient taking differently: Take 2 mg by mouth at bedtime. 12/12/20     valsartan (DIOVAN) 80 MG tablet Take 1 tablet (80 mg total) by mouth 2 (two) times daily. 03/19/21   Janith Lima, MD    Family History Family History  Problem Relation Age of Onset   Cancer Mother    Hypertension Mother    Breast cancer Mother     Social History Social History   Tobacco Use   Smoking status: Never   Smokeless tobacco: Never  Vaping Use   Vaping Use: Never used  Substance Use Topics   Alcohol use: Never   Drug use: Never     Allergies   Bactrim [sulfamethoxazole-trimethoprim]   Review of Systems Review of Systems  Gastrointestinal:  Positive for diarrhea and vomiting.     Physical Exam Triage Vital Signs ED Triage Vitals  Enc Vitals Group     BP 09/06/21 1420 (!) 136/91     Pulse Rate 09/06/21 1420 (!) 102     Resp 09/06/21 1420 17     Temp 09/06/21 1420 98.4 F (36.9 C)     Temp Source 09/06/21 1420 Oral     SpO2 09/06/21 1420 98 %     Weight --      Height --      Head Circumference --      Peak Flow --      Pain Score 09/06/21 1419 0     Pain Loc --      Pain Edu? --      Excl. in [place]? --    No data found.  Updated Vital Signs BP (!) 136/91 (BP Location: Right Arm)   Pulse (!) 102   Temp 98.4 F (36.9 C) (Oral)   Resp 17   SpO2 98%   Visual Acuity Right Eye Distance:   Left Eye Distance:   Bilateral Distance:    Right Eye Near:   Left Eye Near:    Bilateral Near:     Physical Exam Constitutional:      Appearance: Normal appearance.  Cardiovascular:     Rate and Rhythm: Normal rate and regular rhythm.     Heart sounds: Normal heart sounds.  Pulmonary:     Effort: Pulmonary effort is normal.     Breath sounds: Normal breath sounds  and air  entry. No wheezing, rhonchi or rales.  Abdominal:     General: Abdomen is protuberant. Bowel sounds are increased. There is distension (moderate).     Palpations: Abdomen is soft.     Tenderness: There is no abdominal tenderness. There is no guarding or rebound.  Lymphadenopathy:     Cervical: No cervical adenopathy.  Neurological:     Mental Status: She is alert.      UC Treatments / Results  Labs (all labs ordered are listed, but only abnormal results are displayed) Labs Reviewed  CBC WITH DIFFERENTIAL/PLATELET  COMPREHENSIVE METABOLIC PANEL    EKG   Radiology No results found.  Procedures Procedures (including critical care time)  Medications Ordered in UC Medications  ondansetron (ZOFRAN-ODT) disintegrating tablet 4 mg (has no administration in time range)    Initial Impression / Assessment and Plan / UC Course  I have reviewed the triage vital signs and the nursing notes.  Pertinent labs & imaging results that were available during my care of the patient were reviewed by me and considered in my medical decision making (see chart for details).    Plan: 1.  Advised take the Zofran every 6 hours on a regular basis to help decrease the nausea vomiting and loose stools. 2.  CBC and CMP pending. 3.  Advised to follow-up with PCP for further evaluation if symptoms fail to improve over the next 48 to 72 hours. Final Clinical Impressions(s) / UC Diagnoses   Final diagnoses:  Nausea vomiting and diarrhea  Dehydration  Other noninfectious gastroenteritis     Discharge Instructions      Advised take the Zofran every 6 hours on a regular basis to see if this will calm down the nausea, vomiting and loose stools. Advised to take in clear liquids with Sprite-ginger ale-7-Up-chicken broth-bland diet. Advised to follow-up with PCP if symptoms fail to improve in the next 48 to 72 hours for further evaluation if needed.    ED Prescriptions     Medication Sig Dispense  Auth. Provider   ondansetron (ZOFRAN) 4 MG tablet Take 1 tablet (4 mg total) by mouth every 8 (eight) hours as needed for nausea or vomiting. 20 tablet Nyoka Lint, PA-C      PDMP not reviewed this encounter.   Nyoka Lint, PA-C 09/06/21 1539

## 2021-09-08 ENCOUNTER — Observation Stay (HOSPITAL_COMMUNITY): Payer: 59

## 2021-09-08 ENCOUNTER — Encounter (HOSPITAL_COMMUNITY): Payer: Self-pay | Admitting: Internal Medicine

## 2021-09-08 ENCOUNTER — Inpatient Hospital Stay (HOSPITAL_COMMUNITY)
Admission: EM | Admit: 2021-09-08 | Discharge: 2021-09-10 | DRG: 683 | Disposition: A | Discharge status: Home / Self Care | Payer: 59 | Attending: Internal Medicine | Admitting: Internal Medicine

## 2021-09-08 DIAGNOSIS — Z85528 Personal history of other malignant neoplasm of kidney: Secondary | ICD-10-CM | POA: Diagnosis not present

## 2021-09-08 DIAGNOSIS — R112 Nausea with vomiting, unspecified: Secondary | ICD-10-CM | POA: Diagnosis not present

## 2021-09-08 DIAGNOSIS — Z803 Family history of malignant neoplasm of breast: Secondary | ICD-10-CM

## 2021-09-08 DIAGNOSIS — R197 Diarrhea, unspecified: Secondary | ICD-10-CM | POA: Diagnosis not present

## 2021-09-08 DIAGNOSIS — N2889 Other specified disorders of kidney and ureter: Secondary | ICD-10-CM | POA: Diagnosis not present

## 2021-09-08 DIAGNOSIS — Z8249 Family history of ischemic heart disease and other diseases of the circulatory system: Secondary | ICD-10-CM

## 2021-09-08 DIAGNOSIS — M4800 Spinal stenosis, site unspecified: Secondary | ICD-10-CM

## 2021-09-08 DIAGNOSIS — K5730 Diverticulosis of large intestine without perforation or abscess without bleeding: Secondary | ICD-10-CM | POA: Diagnosis not present

## 2021-09-08 DIAGNOSIS — Z20822 Contact with and (suspected) exposure to covid-19: Secondary | ICD-10-CM | POA: Diagnosis present

## 2021-09-08 DIAGNOSIS — I10 Essential (primary) hypertension: Secondary | ICD-10-CM

## 2021-09-08 DIAGNOSIS — N390 Urinary tract infection, site not specified: Secondary | ICD-10-CM | POA: Diagnosis present

## 2021-09-08 DIAGNOSIS — N179 Acute kidney failure, unspecified: Principal | ICD-10-CM

## 2021-09-08 DIAGNOSIS — E860 Dehydration: Secondary | ICD-10-CM

## 2021-09-08 DIAGNOSIS — Z905 Acquired absence of kidney: Secondary | ICD-10-CM

## 2021-09-08 DIAGNOSIS — Z981 Arthrodesis status: Secondary | ICD-10-CM

## 2021-09-08 DIAGNOSIS — M549 Dorsalgia, unspecified: Secondary | ICD-10-CM | POA: Diagnosis present

## 2021-09-08 DIAGNOSIS — K529 Noninfective gastroenteritis and colitis, unspecified: Secondary | ICD-10-CM | POA: Diagnosis present

## 2021-09-08 DIAGNOSIS — G8929 Other chronic pain: Secondary | ICD-10-CM | POA: Diagnosis present

## 2021-09-08 DIAGNOSIS — Z79899 Other long term (current) drug therapy: Secondary | ICD-10-CM

## 2021-09-08 HISTORY — DX: Malignant neoplasm of right kidney, except renal pelvis: C64.1

## 2021-09-08 LAB — CBC WITH DIFFERENTIAL/PLATELET
Abs Immature Granulocytes: 0.06 10*3/uL (ref 0.00–0.07)
Basophils Absolute: 0 10*3/uL (ref 0.0–0.1)
Basophils Relative: 0 %
Eosinophils Absolute: 0.4 10*3/uL (ref 0.0–0.5)
Eosinophils Relative: 4 %
HCT: 40.9 % (ref 36.0–46.0)
Hemoglobin: 12.8 g/dL (ref 12.0–15.0)
Immature Granulocytes: 1 %
Lymphocytes Relative: 20 %
Lymphs Abs: 2.2 10*3/uL (ref 0.7–4.0)
MCH: 29.2 pg (ref 26.0–34.0)
MCHC: 31.3 g/dL (ref 30.0–36.0)
MCV: 93.2 fL (ref 80.0–100.0)
Monocytes Absolute: 1.1 10*3/uL — ABNORMAL HIGH (ref 0.1–1.0)
Monocytes Relative: 10 %
Neutro Abs: 7.3 10*3/uL (ref 1.7–7.7)
Neutrophils Relative %: 65 %
Platelets: 356 10*3/uL (ref 150–400)
RBC: 4.39 MIL/uL (ref 3.87–5.11)
RDW: 14.4 % (ref 11.5–15.5)
WBC: 11.2 10*3/uL — ABNORMAL HIGH (ref 4.0–10.5)
nRBC: 0 % (ref 0.0–0.2)

## 2021-09-08 LAB — URINALYSIS, COMPLETE (UACMP) WITH MICROSCOPIC
Bilirubin Urine: NEGATIVE
Glucose, UA: NEGATIVE mg/dL
Hgb urine dipstick: NEGATIVE
Ketones, ur: NEGATIVE mg/dL
Nitrite: NEGATIVE
Protein, ur: NEGATIVE mg/dL
Specific Gravity, Urine: 1.005 (ref 1.005–1.030)
pH: 5 (ref 5.0–8.0)

## 2021-09-08 LAB — COMPREHENSIVE METABOLIC PANEL
ALT: 19 U/L (ref 0–44)
AST: 16 U/L (ref 15–41)
Albumin: 4.5 g/dL (ref 3.5–5.0)
Alkaline Phosphatase: 78 U/L (ref 38–126)
Anion gap: 12 (ref 5–15)
BUN: 46 mg/dL — ABNORMAL HIGH (ref 6–20)
CO2: 23 mmol/L (ref 22–32)
Calcium: 9.8 mg/dL (ref 8.9–10.3)
Chloride: 103 mmol/L (ref 98–111)
Creatinine, Ser: 3.21 mg/dL — ABNORMAL HIGH (ref 0.44–1.00)
GFR, Estimated: 16 mL/min — ABNORMAL LOW (ref >60)
Glucose, Bld: 104 mg/dL — ABNORMAL HIGH (ref 70–99)
Potassium: 4.2 mmol/L (ref 3.5–5.1)
Sodium: 138 mmol/L (ref 135–145)
Total Bilirubin: 0.9 mg/dL (ref 0.3–1.2)
Total Protein: 7.4 g/dL (ref 6.5–8.1)

## 2021-09-08 LAB — SARS CORONAVIRUS 2 BY RT PCR: SARS Coronavirus 2 by RT PCR: NEGATIVE

## 2021-09-08 LAB — I-STAT BETA HCG BLOOD, ED (MC, WL, AP ONLY): I-stat hCG, quantitative: <5 m[IU]/mL (ref <5)

## 2021-09-08 LAB — LIPASE, BLOOD: Lipase: 44 U/L (ref 11–51)

## 2021-09-08 MED ORDER — TIZANIDINE HCL 2 MG PO TABS
2.0000 mg | ORAL_TABLET | Freq: Every day | ORAL | Status: DC
  Administered 2021-09-08 – 2021-09-10 (×2): 2 mg via ORAL
  Filled 2021-09-08 (×3): qty 1

## 2021-09-08 MED ORDER — ONDANSETRON HCL 4 MG/2ML IJ SOLN: 4.0000 mg | Freq: Four times a day (QID) | INTRAMUSCULAR | Status: DC | PRN

## 2021-09-08 MED ORDER — LACTATED RINGERS IV SOLN
INTRAVENOUS | Status: DC
  Administered 2021-09-08: 125 mL/h via INTRAVENOUS

## 2021-09-08 MED ORDER — OXYCODONE-ACETAMINOPHEN 5-325 MG PO TABS
1.0000 | ORAL_TABLET | Freq: Three times a day (TID) | ORAL | Status: DC | PRN
  Administered 2021-09-09 (×2): 1 via ORAL
  Filled 2021-09-08 (×3): qty 1

## 2021-09-08 MED ORDER — LACTATED RINGERS IV BOLUS
1000.0000 mL | Freq: Once | INTRAVENOUS | Status: AC
  Administered 2021-09-08: 1000 mL via INTRAVENOUS

## 2021-09-08 MED ORDER — ONDANSETRON HCL 4 MG/2ML IJ SOLN
4.0000 mg | Freq: Once | INTRAMUSCULAR | Status: AC
  Administered 2021-09-08: 4 mg via INTRAVENOUS

## 2021-09-08 MED ORDER — ACETAMINOPHEN 650 MG RE SUPP: 650.0000 mg | Freq: Four times a day (QID) | RECTAL | Status: DC | PRN

## 2021-09-08 MED ORDER — GABAPENTIN 300 MG PO CAPS
300.0000 mg | ORAL_CAPSULE | Freq: Three times a day (TID) | ORAL | Status: DC
Start: 2021-09-08 — End: 2021-09-08

## 2021-09-08 MED ORDER — HEPARIN SODIUM (PORCINE) 5000 UNIT/ML IJ SOLN
5000.0000 [IU] | Freq: Three times a day (TID) | INTRAMUSCULAR | Status: DC
  Administered 2021-09-09 – 2021-09-10 (×4): 5000 [IU] via SUBCUTANEOUS
  Filled 2021-09-08 (×4): qty 1

## 2021-09-08 MED ORDER — ACETAMINOPHEN 325 MG PO TABS
650.0000 mg | ORAL_TABLET | Freq: Four times a day (QID) | ORAL | Status: DC | PRN
  Administered 2021-09-09: 650 mg via ORAL
  Filled 2021-09-08: qty 2

## 2021-09-08 MED ORDER — ONDANSETRON HCL 4 MG PO TABS: 4.0000 mg | ORAL_TABLET | Freq: Four times a day (QID) | ORAL | Status: DC | PRN

## 2021-09-08 NOTE — Assessment & Plan Note (Signed)
Hold home BP meds in setting of AKI with mild hypotension today.

## 2021-09-08 NOTE — Assessment & Plan Note (Addendum)
?   Infectious C.Diff pending Ordering GI pathogen pnl CT AP pending as above Zofran PRN nausea

## 2021-09-08 NOTE — Assessment & Plan Note (Addendum)
Creat 3.2 today up from 0.8 in July (prior to nephrectomy) and 1.2 just 2 days ago (presumably about her baseline post nephrectomy). Presumably pre-renal / ATN due to N/V/D + hypotension 1. IVF 2. Strict intake and output 3. UA 4. Repeat BMP in AM 5. Going to check CT AP non-contrast given recent surgery in July for CA resection, need to r/o obstructive uropathy, having N/V/D, etc

## 2021-09-08 NOTE — Assessment & Plan Note (Addendum)
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 Getting CT AP as above today.

## 2021-09-08 NOTE — ED Provider Notes (Addendum)
[facility] EMERGENCY DEPARTMENT Provider Note   CSN: [id number] Arrival date & time: 09/08/21  1459     History  Chief Complaint  Patient presents with   Nausea   Emesis   Diarrhea   Abdominal Pain    Joann Bond is a 56 y.o. female. Presenting with 4 days of nausea, vomiting, diarrhea, and intermittent abdominal pain.  Patient reports she has abdominal pain only associated with diarrhea and then is relieved with diarrhea.  Diarrhea is nonbloody.  Emesis is nonbloody nonbilious.  Last episode emesis was yesterday, she has been taking Zofran consistently.  She was seen in urgent care 2 days ago, but symptoms have persisted.  She denies fever or known sick contacts. Had a partial nephrectomy 6 weeks ago, however reports she had previously been doing well until 4 days ago.  Denies flank pain or urinary symptoms.  She is still making urine, but states it has significantly decreased since her symptoms began.  Emesis Associated symptoms: abdominal pain and diarrhea   Associated symptoms: no arthralgias, no chills, no cough, no fever and no sore throat   Diarrhea Associated symptoms: abdominal pain and vomiting   Associated symptoms: no arthralgias, no chills and no fever   Abdominal Pain Associated symptoms: diarrhea, nausea and vomiting   Associated symptoms: no chest pain, no chills, no constipation, no cough, no dysuria, no fever, no hematuria, no shortness of breath and no sore throat        Home Medications Prior to Admission medications   Medication Sig Start Date End Date Taking? Authorizing Provider  amLODipine (NORVASC) 5 MG tablet Take 1 tablet (5 mg total) by mouth daily. 03/19/21  Yes Janith Lima, MD  gabapentin (NEURONTIN) 300 MG capsule Take 1 capsule (300 mg total) by mouth 3 (three) times daily. 07/15/21  Yes   nebivolol (BYSTOLIC) 5 MG tablet Take 1 tablet (5 mg total) by mouth daily. Patient taking differently: Take 5 mg by mouth every  evening. 08/14/21  Yes Janith Lima, MD  ondansetron (ZOFRAN) 4 MG tablet Take 1 tablet (4 mg total) by mouth every 8 (eight) hours as needed for nausea or vomiting. 09/06/21  Yes Nyoka Lint, PA-C  oxyCODONE-acetaminophen (PERCOCET/ROXICET) 5-325 MG tablet Take 1 tablet by mouth every 8 (eight) hours as needed for pain for up to 30 days. (max daily amount 3 tablets) 10/04/21  Yes   tizanidine (ZANAFLEX) 2 MG capsule Take 1 capsule (2 mg total) by mouth 3 (three) times daily. Patient taking differently: Take 2 mg by mouth at bedtime. 12/12/20  Yes   valsartan (DIOVAN) 80 MG tablet Take 1 tablet (80 mg total) by mouth 2 (two) times daily. 03/19/21  Yes Janith Lima, MD  docusate sodium (COLACE) 100 MG capsule Take 1 capsule (100 mg total) by mouth 2 (two) times daily. Patient not taking: Reported on 09/08/2021 07/25/21   Debbrah Alar, PA-C  oxyCODONE-acetaminophen (PERCOCET/ROXICET) 5-325 MG tablet Take 1 to 2 tablets by mouth every 8 hours as needed for moderate pain or severe pain. Patient not taking: Reported on 09/08/2021 07/25/21   Debbrah Alar, PA-C  oxyCODONE-acetaminophen (PERCOCET/ROXICET) 5-325 MG tablet Take 1 tablet by mouth every 8 (eight) hours as needed for pain for up to 30 days. Max daily amount 3 tablets Patient not taking: Reported on 09/08/2021 09/04/21     oxyCODONE-acetaminophen (PERCOCET/ROXICET) 5-325 MG tablet Take 1 tablet by mouth every 8 (eight) hours as needed for pain for up to 30 days.  Max daily amount 3 tablets Patient not taking: Reported on 09/08/2021 08/05/21         Allergies    Bactrim [sulfamethoxazole-trimethoprim]    Review of Systems   Review of Systems  Constitutional:  Negative for chills and fever.  HENT:  Negative for ear pain and sore throat.   Eyes:  Negative for pain and visual disturbance.  Respiratory:  Negative for cough and shortness of breath.   Cardiovascular:  Negative for chest pain and palpitations.  Gastrointestinal:  Positive for abdominal  distention, abdominal pain, diarrhea, nausea and vomiting. Negative for anal bleeding, blood in stool and constipation.  Genitourinary:  Positive for decreased urine volume. Negative for dysuria, flank pain and hematuria.  Musculoskeletal:  Negative for arthralgias and back pain.  Skin:  Negative for color change and rash.  Neurological:  Negative for seizures and syncope.  All other systems reviewed and are negative.   Physical Exam Updated Vital Signs BP (!) 93/56   Pulse 76   Temp 98.2 F (36.8 C) (Oral)   Resp 16   SpO2 100%  Physical Exam Vitals and nursing note reviewed.  Constitutional:      General: She is not in acute distress.    Appearance: She is well-developed.  HENT:     Head: Normocephalic and atraumatic.  Eyes:     Conjunctiva/sclera: Conjunctivae normal.  Cardiovascular:     Rate and Rhythm: Normal rate and regular rhythm.     Heart sounds: No murmur heard. Pulmonary:     Effort: Pulmonary effort is normal. No respiratory distress.     Breath sounds: Normal breath sounds.  Abdominal:     General: There is distension (Mild).     Palpations: Abdomen is soft.     Tenderness: There is no abdominal tenderness. There is no right CVA tenderness or left CVA tenderness.  Musculoskeletal:        General: No swelling.     Cervical back: Neck supple.  Skin:    General: Skin is warm and dry.     Capillary Refill: Capillary refill takes less than 2 seconds.  Neurological:     Mental Status: She is alert.  Psychiatric:        Mood and Affect: Mood normal.     ED Results / Procedures / Treatments   Labs (all labs ordered are listed, but only abnormal results are displayed) Labs Reviewed  CBC WITH DIFFERENTIAL/PLATELET - Abnormal; Notable for the following components:      Result Value   WBC 11.2 (*)    Monocytes Absolute 1.1 (*)    All other components within normal limits  COMPREHENSIVE METABOLIC PANEL - Abnormal; Notable for the following components:    Glucose, Bld 104 (*)    BUN 46 (*)    Creatinine, Ser 3.21 (*)    GFR, Estimated 16 (*)    All other components within normal limits  URINALYSIS, COMPLETE (UACMP) WITH MICROSCOPIC - Abnormal; Notable for the following components:   APPearance HAZY (*)    Leukocytes,Ua LARGE (*)    Bacteria, UA MANY (*)    All other components within normal limits  SARS CORONAVIRUS 2 BY RT PCR  GASTROINTESTINAL PANEL BY PCR, STOOL (REPLACES STOOL CULTURE)  C DIFFICILE QUICK SCREEN W PCR REFLEX    LIPASE, BLOOD  URINALYSIS, ROUTINE W REFLEX MICROSCOPIC  HIV ANTIBODY (ROUTINE TESTING W REFLEX)  CBC  BASIC METABOLIC PANEL  I-STAT BETA HCG BLOOD, ED ([facility], [facility], [facility] ONLY)  EKG None  Radiology No results found.  Procedures Procedures    Medications Ordered in ED Medications  oxyCODONE-acetaminophen (PERCOCET/ROXICET) 5-325 MG per tablet 1 tablet (has no administration in time range)  tiZANidine (ZANAFLEX) tablet 2 mg (has no administration in time range)  lactated ringers infusion (has no administration in time range)  heparin injection 5,000 Units (has no administration in time range)  acetaminophen (TYLENOL) tablet 650 mg (has no administration in time range)    Or  acetaminophen (TYLENOL) suppository 650 mg (has no administration in time range)  ondansetron (ZOFRAN) tablet 4 mg (has no administration in time range)    Or  ondansetron (ZOFRAN) injection 4 mg (has no administration in time range)  lactated ringers bolus 1,000 mL (0 mLs Intravenous Stopped 09/08/21 2027)  ondansetron (ZOFRAN) injection 4 mg (4 mg Intravenous Given 09/08/21 1951)  lactated ringers bolus 1,000 mL (1,000 mLs Intravenous New Bag/Given 09/08/21 2032)    ED Course/ Medical Decision Making/ A&P                           Medical Decision Making Risk Prescription drug management. Decision regarding hospitalization.    56 year old female history of right partial nephrectomy 07/25/2021 for a right inferior renal pole  lesion, renovascular hypertension, CKD, osteoporosis, osteomalacia, scoliosis s/p 2 spinal fusions presenting with creased p.o. intake, abdominal pain, nausea, vomiting, diarrhea x 4 days.  Diagnosis includes AKI, dehydration, electrolyte abnormalities, UTI, gastroenteritis  Labs reviewed and notable for leukocytosis of 11.2, no significant anemia.  Creatinine 3.21 dated from 1.19 two days ago.  No significant electrolyte abnormalities.  hCG undetectable.  Lipase within normal limits.  Patient given 1 L IV fluids. Suspect AKI due to significant fluid losses. Due to significant AKI in the setting of persistent symptoms and decreased urine output, patient will require admission..  Patient was admitted to the hospital service.        Final Clinical Impression(s) / ED Diagnoses Final diagnoses:  Dehydration  Nausea vomiting and diarrhea    Rx / DC Orders ED Discharge Orders     None          Rosine Abe, MD 09/08/21 2100    Sherwood Gambler, MD 09/09/21 1627

## 2021-09-08 NOTE — ED Provider Triage Note (Signed)
Emergency Medicine Provider Triage Evaluation Note  Joann Bond , a 56 y.o. female  was evaluated in triage.  Pt complains of nausea, vomiting, diarrhea times about a week.  Seen in urgent care 2 days ago, got diagnosed with gastroenteritis and taking Zofran with minimal improvement.  Having diffuse abdominal pain, little episodes of diarrhea with no blood.   Partial nephrectomy 07/25/2021.  Review of Systems  Per HPI  Physical Exam  BP 92/64 (BP Location: Right Arm)   Pulse 83   Temp 98.1 F (36.7 C) (Oral)   Resp 16   SpO2 96%  Gen:   Awake, no distress   Resp:  Normal effort  MSK:   Moves extremities without difficulty  Other:  Moderate abdomen distention.  Multiple surgical scars, no focal tenderness periumbilical tenderness without rigidity or guarding  Medical Decision Making  Medically screening exam initiated at 3:20 PM.  Appropriate orders placed.  Joann Bond was informed that the remainder of the evaluation will be completed by another provider, this initial triage assessment does not replace that evaluation, and the importance of remaining in the ED until their evaluation is complete.  Labs.  Given bounce back will proceed with CT postop complication versus SBO versus alternative etiology   Joann Raring, PA-C 09/08/21 1522

## 2021-09-08 NOTE — ED Notes (Signed)
RN aware of BP 

## 2021-09-08 NOTE — ED Triage Notes (Signed)
Pt c/o generalized abd pain, N/V/D onset 2 days ago; hx partial phrostomy 6wks ago. Diarrhea "is just mostly water; it was gas for awhile but then diarrhea started." Poor PO

## 2021-09-08 NOTE — Assessment & Plan Note (Addendum)
Continue PRN oxycodone. Hold gabapentin for the moment due to AKI

## 2021-09-08 NOTE — H&P (Signed)
History and Physical    Patient: Joann Bond PJA:[id number] DOB: 1965/07/10 DOA: 09/08/2021 DOS: the patient was seen and examined on 09/08/2021 PCP: Janith Lima, MD  Patient coming from: Home  Chief Complaint:  Chief Complaint  Patient presents with   Nausea   Emesis   Diarrhea   Abdominal Pain   HPI: Joann Bond is a 56 y.o. 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  Had been doing well since surgery up until ~4 days ago.  Developed N/V/D and intermittent mild abd pain.  This has been persistent.  N/V/D has been severe.   Review of Systems: As mentioned in the history of present illness. All other systems reviewed and are negative. 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, neg margins   Hypertension    Pneumonia    many years ago   Past Surgical History:  Procedure Laterality Date   EYE SURGERY     ROBOTIC ASSITED PARTIAL NEPHRECTOMY Right 07/25/2021   Procedure: XI ROBOTIC ASSITED  LAPAROSCOPIC PARTIAL NEPHRECTOMY;  Surgeon: Janith Lima, MD;  Location: [facility];  Service: Urology;  Laterality: Right;  ONLY NEEDS 240 MIN   SPINAL FUSION     Scoliosis/ age 69   SPINAL FUSION     spinal stenosis age 89   TONSILLECTOMY     Social History:  reports that she has never smoked. She has never used smokeless tobacco. She reports that she does not drink alcohol and does not use drugs.  Allergies  Allergen Reactions   Bactrim [Sulfamethoxazole-Trimethoprim] Hives    Family History  Problem Relation Age of Onset   Cancer Mother    Hypertension Mother    Breast cancer Mother     Prior to Admission medications   Medication Sig Start Date End Date Taking? Authorizing Provider  amLODipine (NORVASC) 5 MG  tablet Take 1 tablet (5 mg total) by mouth daily. 03/19/21  Yes Janith Lima, MD  gabapentin (NEURONTIN) 300 MG capsule Take 1 capsule (300 mg total) by mouth 3 (three) times daily. 07/15/21  Yes   nebivolol (BYSTOLIC) 5 MG tablet Take 1 tablet (5 mg total) by mouth daily. Patient taking differently: Take 5 mg by mouth every evening. 08/14/21  Yes Janith Lima, MD  ondansetron (ZOFRAN) 4 MG tablet Take 1 tablet (4 mg total) by mouth every 8 (eight) hours as needed for nausea or vomiting. 09/06/21  Yes Nyoka Lint, PA-C  oxyCODONE-acetaminophen (PERCOCET/ROXICET) 5-325 MG tablet Take 1 tablet by mouth every 8 (eight) hours as needed for pain for up to 30 days. (max daily amount 3 tablets) 10/04/21  Yes   tizanidine (ZANAFLEX) 2 MG capsule Take 1 capsule (2 mg total) by mouth 3 (three) times daily. Patient taking differently: Take 2 mg by mouth at bedtime. 12/12/20  Yes   valsartan (DIOVAN) 80 MG tablet Take 1 tablet (80 mg total) by mouth 2 (two) times daily. 03/19/21  Yes Janith Lima, MD  docusate sodium (COLACE) 100 MG capsule Take 1 capsule (100 mg total) by mouth 2 (two) times daily. Patient not taking: Reported on 09/08/2021 07/25/21   Debbrah Alar, PA-C  oxyCODONE-acetaminophen (PERCOCET/ROXICET) 5-325 MG tablet Take 1  to 2 tablets by mouth every 8 hours as needed for moderate pain or severe pain. Patient not taking: Reported on 09/08/2021 07/25/21   Debbrah Alar, PA-C  oxyCODONE-acetaminophen (PERCOCET/ROXICET) 5-325 MG tablet Take 1 tablet by mouth every 8 (eight) hours as needed for pain for up to 30 days. Max daily amount 3 tablets Patient not taking: Reported on 09/08/2021 09/04/21     oxyCODONE-acetaminophen (PERCOCET/ROXICET) 5-325 MG tablet Take 1 tablet by mouth every 8 (eight) hours as needed for pain for up to 30 days. Max daily amount 3 tablets Patient not taking: Reported on 09/08/2021 08/05/21       Physical Exam: Vitals:   09/08/21 1508 09/08/21 1823 09/08/21 1825 09/08/21 1951  BP:  92/64 (!) 84/55 (!) 93/56   Pulse: 83 77 76   Resp: '16 16 16   '$ Temp: 98.1 F (36.7 C)   98.2 F (36.8 C)  TempSrc: Oral   Oral  SpO2: 96% 99% 100%    Constitutional: NAD, calm, comfortable Eyes: PERRL, lids and conjunctivae normal ENMT: Mucous membranes are moist. Posterior pharynx clear of any exudate or lesions.Normal dentition.  Neck: normal, supple, no masses, no thyromegaly Respiratory: clear to auscultation bilaterally, no wheezing, no crackles. Normal respiratory effort. No accessory muscle use.  Cardiovascular: Regular rate and rhythm, no murmurs / rubs / gallops. No extremity edema. 2+ pedal pulses. No carotid bruits.  Abdomen: no tenderness, no masses palpated. No hepatosplenomegaly. Bowel sounds positive.  Musculoskeletal: no clubbing / cyanosis. No joint deformity upper and lower extremities. Good ROM, no contractures. Normal muscle tone.  Skin: no rashes, lesions, ulcers. No induration Neurologic: CN 2-12 grossly intact. Sensation intact, DTR normal. Strength 5/5 in all 4.  Psychiatric: Normal judgment and insight. Alert and oriented x 3. Normal mood.   Data Reviewed:    Creat 1.2 x2 days ago  Creat today 3.2, BUN 46  WBC 11.2  Assessment and Plan: * AKI (acute kidney injury) ([place]) Creat 3.2 today up from 0.8 in July (prior to nephrectomy) and 1.2 just 2 days ago (presumably about her baseline post nephrectomy). Presumably pre-renal / ATN due to N/V/D + hypotension IVF Strict intake and output UA Repeat BMP in AM Going to check CT AP non-contrast given recent surgery in July for CA resection, need to r/o obstructive uropathy, having N/V/D, etc  Nausea vomiting and diarrhea ? Infectious C.Diff pending Ordering GI pathogen pnl CT AP pending as above Zofran PRN nausea  Right renal mass S/P partial nephrectomy in July, Clear cell carcinoma 2.1cm, neg margins. Getting CT AP as above today.  Spinal stenosis Continue PRN oxycodone. Hold gabapentin for the  moment due to AKI  Essential hypertension Hold home BP meds in setting of AKI with mild hypotension today.      Advance Care Planning:   Code Status: Full Code  Consults: None  Family Communication: No family in room  Severity of Illness: The appropriate patient status for this patient is OBSERVATION. Observation status is judged to be reasonable and necessary in order to provide the required intensity of service to ensure the patient's safety. The patient's presenting symptoms, physical exam findings, and initial radiographic and laboratory data in the context of their medical condition is felt to place them at decreased risk for further clinical deterioration. Furthermore, it is anticipated that the patient will be medically stable for discharge from the hospital within 2 midnights of admission.   Author: Etta Quill., DO 09/08/2021 8:16 PM  For on call review  [email or web address]

## 2021-09-09 DIAGNOSIS — E860 Dehydration: Secondary | ICD-10-CM | POA: Diagnosis present

## 2021-09-09 DIAGNOSIS — N390 Urinary tract infection, site not specified: Secondary | ICD-10-CM | POA: Diagnosis present

## 2021-09-09 DIAGNOSIS — M4800 Spinal stenosis, site unspecified: Secondary | ICD-10-CM | POA: Diagnosis present

## 2021-09-09 DIAGNOSIS — M549 Dorsalgia, unspecified: Secondary | ICD-10-CM | POA: Diagnosis present

## 2021-09-09 DIAGNOSIS — Z85528 Personal history of other malignant neoplasm of kidney: Secondary | ICD-10-CM | POA: Diagnosis not present

## 2021-09-09 DIAGNOSIS — Z8249 Family history of ischemic heart disease and other diseases of the circulatory system: Secondary | ICD-10-CM | POA: Diagnosis not present

## 2021-09-09 DIAGNOSIS — N179 Acute kidney failure, unspecified: Secondary | ICD-10-CM | POA: Diagnosis not present

## 2021-09-09 DIAGNOSIS — Z803 Family history of malignant neoplasm of breast: Secondary | ICD-10-CM | POA: Diagnosis not present

## 2021-09-09 DIAGNOSIS — I10 Essential (primary) hypertension: Secondary | ICD-10-CM | POA: Diagnosis present

## 2021-09-09 DIAGNOSIS — Z79899 Other long term (current) drug therapy: Secondary | ICD-10-CM | POA: Diagnosis not present

## 2021-09-09 DIAGNOSIS — K529 Noninfective gastroenteritis and colitis, unspecified: Secondary | ICD-10-CM | POA: Diagnosis present

## 2021-09-09 DIAGNOSIS — Z981 Arthrodesis status: Secondary | ICD-10-CM | POA: Diagnosis not present

## 2021-09-09 DIAGNOSIS — N2889 Other specified disorders of kidney and ureter: Secondary | ICD-10-CM | POA: Diagnosis present

## 2021-09-09 DIAGNOSIS — Z905 Acquired absence of kidney: Secondary | ICD-10-CM | POA: Diagnosis not present

## 2021-09-09 DIAGNOSIS — G8929 Other chronic pain: Secondary | ICD-10-CM | POA: Diagnosis present

## 2021-09-09 DIAGNOSIS — Z20822 Contact with and (suspected) exposure to covid-19: Secondary | ICD-10-CM | POA: Diagnosis present

## 2021-09-09 LAB — BASIC METABOLIC PANEL
Anion gap: 9 (ref 5–15)
BUN: 33 mg/dL — ABNORMAL HIGH (ref 6–20)
CO2: 21 mmol/L — ABNORMAL LOW (ref 22–32)
Calcium: 9.2 mg/dL (ref 8.9–10.3)
Chloride: 114 mmol/L — ABNORMAL HIGH (ref 98–111)
Creatinine, Ser: 2.01 mg/dL — ABNORMAL HIGH (ref 0.44–1.00)
GFR, Estimated: 29 mL/min — ABNORMAL LOW (ref >60)
Glucose, Bld: 86 mg/dL (ref 70–99)
Potassium: 4.1 mmol/L (ref 3.5–5.1)
Sodium: 144 mmol/L (ref 135–145)

## 2021-09-09 LAB — CBC
HCT: 34.9 % — ABNORMAL LOW (ref 36.0–46.0)
Hemoglobin: 11 g/dL — ABNORMAL LOW (ref 12.0–15.0)
MCH: 29.6 pg (ref 26.0–34.0)
MCHC: 31.5 g/dL (ref 30.0–36.0)
MCV: 94.1 fL (ref 80.0–100.0)
Platelets: 236 10*3/uL (ref 150–400)
RBC: 3.71 MIL/uL — ABNORMAL LOW (ref 3.87–5.11)
RDW: 14.3 % (ref 11.5–15.5)
WBC: 8.5 10*3/uL (ref 4.0–10.5)
nRBC: 0 % (ref 0.0–0.2)

## 2021-09-09 LAB — HIV ANTIBODY (ROUTINE TESTING W REFLEX): HIV Screen 4th Generation wRfx: NONREACTIVE

## 2021-09-09 MED ORDER — IPRATROPIUM-ALBUTEROL 0.5-2.5 (3) MG/3ML IN SOLN: 3.0000 mL | RESPIRATORY_TRACT | Status: DC | PRN

## 2021-09-09 MED ORDER — GUAIFENESIN 100 MG/5ML PO LIQD: 5.0000 mL | ORAL | Status: DC | PRN

## 2021-09-09 MED ORDER — SODIUM CHLORIDE 0.9 % IV BOLUS
1000.0000 mL | Freq: Once | INTRAVENOUS | Status: AC
  Administered 2021-09-09: 1000 mL via INTRAVENOUS

## 2021-09-09 MED ORDER — SODIUM CHLORIDE 0.9 % IV SOLN
1.0000 g | INTRAVENOUS | Status: DC
  Administered 2021-09-09 – 2021-09-10 (×2): 1 g via INTRAVENOUS
  Filled 2021-09-09 (×2): qty 10

## 2021-09-09 MED ORDER — METOPROLOL TARTRATE 5 MG/5ML IV SOLN: 5.0000 mg | INTRAVENOUS | Status: DC | PRN

## 2021-09-09 MED ORDER — TRAZODONE HCL 50 MG PO TABS: 50.0000 mg | ORAL_TABLET | Freq: Every evening | ORAL | Status: DC | PRN

## 2021-09-09 MED ORDER — SENNOSIDES-DOCUSATE SODIUM 8.6-50 MG PO TABS: 1.0000 | ORAL_TABLET | Freq: Every evening | ORAL | Status: DC | PRN

## 2021-09-09 MED ORDER — HYDRALAZINE HCL 20 MG/ML IJ SOLN: 10.0000 mg | INTRAMUSCULAR | Status: DC | PRN

## 2021-09-09 NOTE — ED Notes (Signed)
Paged Dr. Marlowe Sax twice with no response.

## 2021-09-09 NOTE — Progress Notes (Signed)
PROGRESS NOTE    Joann Bond  TKP:[id number] DOB: 1965/09/22 DOA: 09/08/2021 PCP: Janith Lima, MD   Brief Narrative:  56 y.o. 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 Had been doing well since surgery up until ~4 days ago.  Developed N/V/D and intermittent mild abd pain.  Upon admission creatinine was noted to be 3.2, baseline 0.8.   Assessment & Plan:  Principal Problem:   AKI (acute kidney injury) ([place]) Active Problems:   Nausea vomiting and diarrhea   Essential hypertension   Spinal stenosis   Right renal mass    * AKI (acute kidney injury) ([place]) Suspect this is prerenal in nature.  We will give IV fluids at this time.  Does have history of nephrectomy.  Admission creatinine 3.2, baseline 0.8.  Slowly improving.   Nausea vomiting and diarrhea CT T of the abdomen pelvis unremarkable.  Symptoms have mostly subsided today.  Full liquid diet thereafter slowly advance as tolerated  Urinary tract infection - She does have symptoms of dysuria and urinary hesitation.  Empiric IV Rocephin   Right renal mass S/P partial nephrectomy in July, Clear cell carcinoma 2.1cm, neg margins.   Spinal stenosis Continue PRN oxycodone. Holding gabapentin   Essential hypertension Hold home BP meds in setting of AKI with mild hypotension today.       DVT prophylaxis: Sup q. heparin Code Status: Full code Family Communication:    During hospital stay for management of AKI   Subjective: Seen and examined at bedside, feels a lot better but tells me she has mid lower abdomen achiness.  Also tells me she has urinary hesitation and mild dysuria that started several weeks ago. During my visit denies any diarrhea, nausea and vomiting   Examination:  General exam: Appears calm and comfortable  Respiratory system: Clear to auscultation. Respiratory effort  normal. Cardiovascular system: S1 & S2 heard, RRR. No JVD, murmurs, rubs, gallops or clicks. No pedal edema. Gastrointestinal system: Lower abdomen is tender to deep palpation.  Central nervous system: Alert and oriented. No focal neurological deficits. Extremities: Symmetric 5 x 5 power. Skin: No rashes, lesions or ulcers Psychiatry: Judgement and insight appear normal. Mood & affect appropriate.     Objective: Vitals:   09/09/21 1015 09/09/21 1048 09/09/21 1100 09/09/21 1200  BP: 113/68 117/75 99/62 114/76  Pulse: 78 82 80 80  Resp: (!) '21 17 18 '$ (!) 21  Temp:  98 F (36.7 C)    TempSrc:  Oral    SpO2: 100% 100% 100% 99%    Intake/Output Summary (Last 24 hours) at 09/09/2021 1252 Last data filed at 09/09/2021 1157 Gross per 24 hour  Intake 1100 ml  Output --  Net 1100 ml   There were no vitals filed for this visit.   Data Reviewed:   CBC: Recent Labs  Lab 09/06/21 1536 09/08/21 1605 09/09/21 0405  WBC 11.3* 11.2* 8.5  NEUTROABS 8.2* 7.3  --   HGB 13.4 12.8 11.0*  HCT 41.1 40.9 34.9*  MCV 90.7 93.2 94.1  PLT 368 356 517   Basic Metabolic Panel: Recent Labs  Lab 09/06/21 1536 09/08/21 1605 09/09/21 0405  NA 140 138 144  K 4.5 4.2 4.1  CL 101 103 114*  CO2 26 23 21*  GLUCOSE 135* 104* 86  BUN 28* 46* 33*  CREATININE 1.19* 3.21* 2.01*  CALCIUM 10.4* 9.8 9.2  GFR: CrCl cannot be calculated (Unknown ideal weight.). Liver Function Tests: Recent Labs  Lab 09/06/21 1536 09/08/21 1605  AST 18 16  ALT 19 19  ALKPHOS 79 78  BILITOT 1.0 0.9  PROT 7.8 7.4  ALBUMIN 4.8 4.5   Recent Labs  Lab 09/08/21 1605  LIPASE 44   No results for input(s): "AMMONIA" in the last 168 hours. Coagulation Profile: No results for input(s): "INR", "PROTIME" in the last 168 hours. Cardiac Enzymes: No results for input(s): "CKTOTAL", "CKMB", "CKMBINDEX", "TROPONINI" in the last 168 hours. BNP (last 3 results) No results for input(s): "PROBNP" in the last 8760  hours. HbA1C: No results for input(s): "HGBA1C" in the last 72 hours. CBG: No results for input(s): "GLUCAP" in the last 168 hours. Lipid Profile: No results for input(s): "CHOL", "HDL", "LDLCALC", "TRIG", "CHOLHDL", "LDLDIRECT" in the last 72 hours. Thyroid Function Tests: No results for input(s): "TSH", "T4TOTAL", "FREET4", "T3FREE", "THYROIDAB" in the last 72 hours. Anemia Panel: No results for input(s): "VITAMINB12", "FOLATE", "FERRITIN", "TIBC", "IRON", "RETICCTPCT" in the last 72 hours. Sepsis Labs: No results for input(s): "PROCALCITON", "LATICACIDVEN" in the last 168 hours.  Recent Results (from the past 240 hour(s))  SARS Coronavirus 2 by RT PCR (hospital order, performed in [facility] hospital lab) *cepheid single result test*     Status: None   Collection Time: 09/08/21  7:24 PM  Result Value Ref Range Status   SARS Coronavirus 2 by RT PCR NEGATIVE NEGATIVE Final    Comment: (NOTE) SARS-CoV-2 target nucleic acids are NOT DETECTED.  The SARS-CoV-2 RNA is generally detectable in upper and lower respiratory specimens during the acute phase of infection. The lowest concentration of SARS-CoV-2 viral copies this assay can detect is 250 copies / mL. A negative result does not preclude SARS-CoV-2 infection and should not be used as the sole basis for treatment or other patient management decisions.  A negative result may occur with improper specimen collection / handling, submission of specimen other than nasopharyngeal swab, presence of viral mutation(s) within the areas targeted by this assay, and inadequate number of viral copies (<250 copies / mL). A negative result must be combined with clinical observations, patient history, and epidemiological information.  Fact Sheet for Patients:   https://www.patel.info/  Fact Sheet for Healthcare Providers: https://hall.com/  This test is not yet approved or  cleared by the [place]  FDA and has been authorized for detection and/or diagnosis of SARS-CoV-2 by FDA under an Emergency Use Authorization (EUA).  This EUA will remain in effect (meaning this test can be used) for the duration of the COVID-19 declaration under Section 564(b)(1) of the Act, 21 U.S.C. section 360bbb-3(b)(1), unless the authorization is terminated or revoked sooner.  Performed at [facility], [place] [address], [place], [place] [address]          Radiology Studies: CT ABDOMEN PELVIS WO CONTRAST  Result Date: 09/08/2021 CLINICAL DATA:  Nausea, vomiting, renal cell carcinoma status post partial right nephrectomy EXAM: CT ABDOMEN AND PELVIS WITHOUT CONTRAST TECHNIQUE: Multidetector CT imaging of the abdomen and pelvis was performed following the standard protocol without IV contrast. RADIATION DOSE REDUCTION: This exam was performed according to the departmental dose-optimization program which includes automated exposure control, adjustment of the mA and/or kV according to patient size and/or use of iterative reconstruction technique. COMPARISON:  MRI 04/07/2021 FINDINGS: Lower chest: Mild left basilar parenchymal scarring. No acute abnormality. Hepatobiliary: No focal liver abnormality is seen. No gallstones, gallbladder wall thickening, or biliary  dilatation. Pancreas: Unremarkable Spleen: Unremarkable Adrenals/Urinary Tract: The adrenal glands are unremarkable. The kidneys are normal in size and position. Interval partial right nephrectomy with resection of the right lower pole cystic renal mass noted on prior MRI examination. The kidneys are otherwise unremarkable. Bladder unremarkable. Stomach/Bowel: Moderate sigmoid and descending colonic diverticulosis. The stomach, small bowel, and large bowel are otherwise unremarkable. Appendix normal. No free intraperitoneal gas or fluid. Vascular/Lymphatic: No significant vascular findings are present. No enlarged abdominal or pelvic lymph nodes.  Reproductive: Uterus and bilateral adnexa are unremarkable. Other: No abdominal wall hernia Musculoskeletal: Extensive thoracolumbar fusion with instrumentation with bilateral sacro are screws are again noted. No acute bone abnormality. No lytic or blastic bone lesion. IMPRESSION: 1. No acute intra-abdominal pathology identified. No definite radiographic explanation for the patient's reported symptoms. 2. Interval partial right nephrectomy. No evidence of residual or recurrent disease. 3. Moderate distal colonic diverticulosis. Electronically Signed   By: Fidela Salisbury M.D.   On: 09/08/2021 21:30        Scheduled Meds:  heparin  5,000 Units Subcutaneous Q8H   tiZANidine  2 mg Oral QHS   Continuous Infusions:  cefTRIAXone (ROCEPHIN)  IV Stopped (09/09/21 1157)   lactated ringers 100 mL/hr at 09/09/21 1121     LOS: 0 days   Time spent= 35 mins    Eryca Bolte Arsenio Loader, MD [facility]  If 7PM-7AM, please contact night-coverage  09/09/2021, 12:52 PM

## 2021-09-09 NOTE — ED Notes (Signed)
Pt O2 sats dropped to high 80's for a period of time. Placed pt on 2L [place]. O2 sats now up to mid 90's.

## 2021-09-10 ENCOUNTER — Encounter (HOSPITAL_COMMUNITY): Payer: Self-pay | Admitting: Internal Medicine

## 2021-09-10 ENCOUNTER — Other Ambulatory Visit (HOSPITAL_COMMUNITY): Payer: Self-pay

## 2021-09-10 ENCOUNTER — Other Ambulatory Visit: Payer: Self-pay

## 2021-09-10 DIAGNOSIS — N179 Acute kidney failure, unspecified: Secondary | ICD-10-CM | POA: Diagnosis not present

## 2021-09-10 LAB — BASIC METABOLIC PANEL
Anion gap: 9 (ref 5–15)
BUN: 12 mg/dL (ref 6–20)
CO2: 23 mmol/L (ref 22–32)
Calcium: 9.3 mg/dL (ref 8.9–10.3)
Chloride: 112 mmol/L — ABNORMAL HIGH (ref 98–111)
Creatinine, Ser: 1.03 mg/dL — ABNORMAL HIGH (ref 0.44–1.00)
GFR, Estimated: >60 mL/min (ref >60)
Glucose, Bld: 115 mg/dL — ABNORMAL HIGH (ref 70–99)
Potassium: 3.9 mmol/L (ref 3.5–5.1)
Sodium: 144 mmol/L (ref 135–145)

## 2021-09-10 LAB — GASTROINTESTINAL PANEL BY PCR, STOOL (REPLACES STOOL CULTURE)

## 2021-09-10 LAB — C DIFFICILE QUICK SCREEN W PCR REFLEX
C Diff antigen: NEGATIVE
C Diff interpretation: NOT DETECTED
C Diff toxin: NEGATIVE

## 2021-09-10 LAB — MAGNESIUM: Magnesium: 1.4 mg/dL — ABNORMAL LOW (ref 1.7–2.4)

## 2021-09-10 MED ORDER — ONDANSETRON HCL 4 MG PO TABS
4.0000 mg | ORAL_TABLET | Freq: Three times a day (TID) | ORAL | 0 refills | Status: DC | PRN
  Filled 2021-09-10: qty 20, 7d supply, fill #0

## 2021-09-10 MED ORDER — CEFDINIR 300 MG PO CAPS
300.0000 mg | ORAL_CAPSULE | Freq: Two times a day (BID) | ORAL | 0 refills | Status: DC
  Filled 2021-09-10: qty 8, 4d supply, fill #0

## 2021-09-10 NOTE — Progress Notes (Signed)
Mobility Specialist Progress Note:   09/10/21 1008  Mobility  Activity Ambulated independently in hallway  Level of Assistance Modified independent, requires aide device or extra time  Assistive Device Other (Comment) (IV Pole)  Distance Ambulated (ft) 420 ft  Activity Response Tolerated well  $Mobility charge 1 Mobility   Pt received ambulating in room and agreeable. No complaints. Pt left in bed with all needs met and call bell in reach.    Joann Bond Mobility Specialist-Acute Rehab Secure Chat only

## 2021-09-10 NOTE — Progress Notes (Signed)
Discharge AVS printed and reviewed with patient. All questions/concerns addressed. Peripheral IV removed. Patient discharged home via private auto.

## 2021-09-11 ENCOUNTER — Telehealth: Payer: Self-pay

## 2021-09-11 NOTE — Discharge Summary (Addendum)
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     Discharge Diagnoses:  Principal Problem:   AKI (acute kidney injury) ([place]) Active Problems:   Nausea vomiting and diarrhea   Essential hypertension   Spinal stenosis   Right renal mass    #1 AKI likely due to volume depletion prerenal nausea vomiting and diarrhea.  She was admitted with a creatinine of 3.2 which improved with IV hydration.  Creatinine was 1.0 on discharge.  #2 nausea vomiting diarrhea resolved.  CT of the abdomen and pelvis was unremarkable.  #3 right renal mass status post partial nephrectomy in July follow-up with oncology as an outpatient.  #4 essential hypertension since Her blood pressure was soft Norvasc was stopped on discharge.  Bystolic dose was decreased to 5 mg daily. Valsartan  was stopped on discharge. Follow-up blood pressure with PCP as an outpatient and restart these medications if needed.  #5 spinal stenosis stable   #6 UTI she was discharged on cefdinir urine culture was pending at the time of discharge.   Estimated body mass index is 25.85 kg/m as calculated from the following:   Height as of 07/25/21: '5\' 3"'$  (1.6 m).   Weight as of 07/25/21: 66.2 kg.  Discharge Instructions  Discharge  Instructions     Call MD for:  difficulty breathing, headache or visual disturbances   Complete by: As directed    Call MD for:  persistant nausea and vomiting   Complete by: As directed    Diet - low sodium heart healthy   Complete by: As directed    Increase activity slowly   Complete by: As directed       Allergies as of 09/10/2021       Reactions   Bactrim [sulfamethoxazole-trimethoprim] Hives        Medication List     STOP taking these medications    amLODipine 5 MG tablet Commonly known as: NORVASC   docusate sodium 100 MG capsule Commonly known as: COLACE   valsartan 80 MG tablet Commonly known as: DIOVAN       TAKE these medications    cefdinir 300 MG capsule Commonly known as: OMNICEF Take 1 capsule (300 mg total) by mouth 2 (two) times daily for 4 days.   gabapentin 300 MG capsule Commonly known as: NEURONTIN Take 1 capsule (300 mg total) by mouth 3 (three) times daily.   nebivolol 5 MG tablet Commonly known as: BYSTOLIC Take 1 tablet (5 mg total) by mouth daily. What changed: when to take this   ondansetron 4 MG tablet Commonly known as: Zofran Take 1 tablet (4 mg total) by mouth every 8 (eight) hours as needed for nausea or vomiting.   oxyCODONE-acetaminophen 5-325 MG tablet Commonly known as: PERCOCET/ROXICET Take 1 tablet by mouth every 8 (eight) hours as needed for pain for up to 30 days. (max daily amount  3 tablets) Start taking on: October 04, 2021 What changed: Another medication with the same name was removed. Continue taking this medication, and follow the directions you see here.   tizanidine 2 MG capsule Commonly known as: ZANAFLEX Take 1 capsule (2 mg total) by mouth 3 (three) times daily. What changed: when to take this        Follow-up Information     Janith Lima, MD Follow up.   Specialty: Internal Medicine Contact information: [place] [address] [phone]                 Allergies  Allergen Reactions   Bactrim [Sulfamethoxazole-Trimethoprim] Hives    Consultations: none   Procedures/Studies: CT ABDOMEN PELVIS WO CONTRAST  Result Date: 09/08/2021 CLINICAL DATA:  Nausea, vomiting, renal cell carcinoma status post partial right nephrectomy EXAM: CT ABDOMEN AND PELVIS WITHOUT CONTRAST TECHNIQUE: Multidetector CT imaging of the abdomen and pelvis was performed following the standard protocol without IV contrast. RADIATION DOSE REDUCTION: This exam was performed according to the departmental dose-optimization program which includes automated exposure control, adjustment of the mA and/or kV according to patient size and/or use of iterative reconstruction technique. COMPARISON:  MRI 04/07/2021 FINDINGS: Lower chest: Mild left basilar parenchymal scarring. No acute abnormality. Hepatobiliary: No focal liver abnormality is seen. No gallstones, gallbladder wall thickening, or biliary dilatation. Pancreas: Unremarkable Spleen: Unremarkable Adrenals/Urinary Tract: The adrenal glands are unremarkable. The kidneys are normal in size and position. Interval partial right nephrectomy with resection of the right lower pole cystic renal mass noted on prior MRI examination. The kidneys are otherwise unremarkable. Bladder unremarkable. Stomach/Bowel: Moderate sigmoid and descending colonic diverticulosis. The stomach, small bowel, and large bowel are otherwise unremarkable. Appendix normal. No free intraperitoneal gas or fluid. Vascular/Lymphatic: No significant vascular findings are present. No enlarged abdominal or pelvic lymph nodes. Reproductive: Uterus and bilateral adnexa are unremarkable. Other: No abdominal wall hernia Musculoskeletal: Extensive thoracolumbar fusion with instrumentation with bilateral sacro are screws are again noted. No acute bone abnormality. No lytic or blastic bone lesion. IMPRESSION: 1. No acute intra-abdominal pathology identified. No definite radiographic  explanation for the patient's reported symptoms. 2. Interval partial right nephrectomy. No evidence of residual or recurrent disease. 3. Moderate distal colonic diverticulosis. Electronically Signed   By: Fidela Salisbury M.D.   On: 09/08/2021 21:30   (Echo, Carotid, EGD, Colonoscopy, ERCP)    Subjective: No complaints anxious to go home tolerated a diet no nausea vomiting or diarrhea  Discharge Exam: Vitals:   09/09/21 2004 09/10/21 0734  BP: 113/67 128/79  Pulse: 87 79  Resp: 16 18  Temp: 98.3 F (36.8 C) (!) 97.5 F (36.4 C)  SpO2: 99% 100%   Vitals:   09/09/21 1500 09/09/21 1718 09/09/21 2004 09/10/21 0734  BP: 121/73 118/83 113/67 128/79  Pulse: 81 77 87 79  Resp: (!) '22 15 16 18  '$ Temp: 98.7 F (37.1 C) 97.8 F (36.6 C) 98.3 F (36.8 C) (!) 97.5 F (36.4 C)  TempSrc: Oral Oral Oral Oral  SpO2: 94% 100% 99% 100%    General: Pt is alert, awake, not in acute distress Cardiovascular: RRR, S1/S2 +, no rubs, no gallops Respiratory: CTA bilaterally, no wheezing, no rhonchi Abdominal: Soft, NT, ND, bowel sounds + Extremities: no edema, no cyanosis    The results of significant diagnostics from this hospitalization (including imaging, microbiology, ancillary and laboratory) are listed below for reference.     Microbiology: Recent Results (from the past 240  hour(s))  SARS Coronavirus 2 by RT PCR (hospital order, performed in [facility] hospital lab) *cepheid single result test*     Status: None   Collection Time: 09/08/21  7:24 PM  Result Value Ref Range Status   SARS Coronavirus 2 by RT PCR NEGATIVE NEGATIVE Final    Comment: (NOTE) SARS-CoV-2 target nucleic acids are NOT DETECTED.  The SARS-CoV-2 RNA is generally detectable in upper and lower respiratory specimens during the acute phase of infection. The lowest concentration of SARS-CoV-2 viral copies this assay can detect is 250 copies / mL. A negative result does not preclude SARS-CoV-2 infection and should not  be used as the sole basis for treatment or other patient management decisions.  A negative result may occur with improper specimen collection / handling, submission of specimen other than nasopharyngeal swab, presence of viral mutation(s) within the areas targeted by this assay, and inadequate number of viral copies (<250 copies / mL). A negative result must be combined with clinical observations, patient history, and epidemiological information.  Fact Sheet for Patients:   https://www.patel.info/  Fact Sheet for Healthcare Providers: https://hall.com/  This test is not yet approved or  cleared by the [place] FDA and has been authorized for detection and/or diagnosis of SARS-CoV-2 by FDA under an Emergency Use Authorization (EUA).  This EUA will remain in effect (meaning this test can be used) for the duration of the COVID-19 declaration under Section 564(b)(1) of the Act, 21 U.S.C. section 360bbb-3(b)(1), unless the authorization is terminated or revoked sooner.  Performed at [facility], [place] [address] [place], [place] [address]   Gastrointestinal Panel by PCR , Stool     Status: None   Collection Time: 09/10/21  3:41 AM   Specimen: Stool  Result Value Ref Range Status   Campylobacter species NOT DETECTED NOT DETECTED Final   Plesimonas shigelloides NOT DETECTED NOT DETECTED Final   Salmonella species NOT DETECTED NOT DETECTED Final   Yersinia enterocolitica NOT DETECTED NOT DETECTED Final   Vibrio species NOT DETECTED NOT DETECTED Final   Vibrio cholerae NOT DETECTED NOT DETECTED Final   Enteroaggregative E coli (EAEC) NOT DETECTED NOT DETECTED Final   Enteropathogenic E coli (EPEC) NOT DETECTED NOT DETECTED Final   Enterotoxigenic E coli (ETEC) NOT DETECTED NOT DETECTED Final   Shiga like toxin producing E coli (STEC) NOT DETECTED NOT DETECTED Final   Shigella/Enteroinvasive E coli (EIEC) NOT DETECTED NOT DETECTED  Final   Cryptosporidium NOT DETECTED NOT DETECTED Final   Cyclospora cayetanensis NOT DETECTED NOT DETECTED Final   Entamoeba histolytica NOT DETECTED NOT DETECTED Final   Giardia lamblia NOT DETECTED NOT DETECTED Final   Adenovirus F40/41 NOT DETECTED NOT DETECTED Final   Astrovirus NOT DETECTED NOT DETECTED Final   Norovirus GI/GII NOT DETECTED NOT DETECTED Final   Rotavirus A NOT DETECTED NOT DETECTED Final   Sapovirus (I, II, IV, and V) NOT DETECTED NOT DETECTED Final    Comment: Performed at [facility], [address] [place], [place] [address]  C Difficile Quick Screen w PCR reflex     Status: None   Collection Time: 09/10/21  3:41 AM   Specimen: STOOL  Result Value Ref Range Status   C Diff antigen NEGATIVE NEGATIVE Final   C Diff toxin NEGATIVE NEGATIVE Final   C Diff interpretation No C. difficile detected.  Final    Comment: Performed at [facility], [place] [address], [place], [place] [address]  Labs: BNP (last 3 results) No results for input(s): "BNP" in the last 8760 hours. Basic Metabolic Panel: Recent Labs  Lab 09/06/21 1536 09/08/21 1605 09/09/21 0405 09/10/21 0542  NA 140 138 144 144  K 4.5 4.2 4.1 3.9  CL 101 103 114* 112*  CO2 26 23 21* 23  GLUCOSE 135* 104* 86 115*  BUN 28* 46* 33* 12  CREATININE 1.19* 3.21* 2.01* 1.03*  CALCIUM 10.4* 9.8 9.2 9.3  MG  --   --   --  1.4*   Liver Function Tests: Recent Labs  Lab 09/06/21 1536 09/08/21 1605  AST 18 16  ALT 19 19  ALKPHOS 79 78  BILITOT 1.0 0.9  PROT 7.8 7.4  ALBUMIN 4.8 4.5   Recent Labs  Lab 09/08/21 1605  LIPASE 44   No results for input(s): "AMMONIA" in the last 168 hours. CBC: Recent Labs  Lab 09/06/21 1536 09/08/21 1605 09/09/21 0405  WBC 11.3* 11.2* 8.5  NEUTROABS 8.2* 7.3  --   HGB 13.4 12.8 11.0*  HCT 41.1 40.9 34.9*  MCV 90.7 93.2 94.1  PLT 368 356 236   Cardiac Enzymes: No results for input(s): "CKTOTAL", "CKMB", "CKMBINDEX", "TROPONINI" in the  last 168 hours. BNP: Invalid input(s): "POCBNP" CBG: No results for input(s): "GLUCAP" in the last 168 hours. D-Dimer No results for input(s): "DDIMER" in the last 72 hours. Hgb A1c No results for input(s): "HGBA1C" in the last 72 hours. Lipid Profile No results for input(s): "CHOL", "HDL", "LDLCALC", "TRIG", "CHOLHDL", "LDLDIRECT" in the last 72 hours. Thyroid function studies No results for input(s): "TSH", "T4TOTAL", "T3FREE", "THYROIDAB" in the last 72 hours.  Invalid input(s): "FREET3" Anemia work up No results for input(s): "VITAMINB12", "FOLATE", "FERRITIN", "TIBC", "IRON", "RETICCTPCT" in the last 72 hours. Urinalysis    Component Value Date/Time   COLORURINE YELLOW 09/08/2021 2003   APPEARANCEUR HAZY (A) 09/08/2021 2003   LABSPEC 1.005 09/08/2021 2003   PHURINE 5.0 09/08/2021 2003   GLUCOSEU NEGATIVE 09/08/2021 2003   GLUCOSEU NEGATIVE 08/21/2020 1102   HGBUR NEGATIVE 09/08/2021 2003   BILIRUBINUR NEGATIVE 09/08/2021 2003   KETONESUR NEGATIVE 09/08/2021 2003   PROTEINUR NEGATIVE 09/08/2021 2003   UROBILINOGEN 0.2 08/21/2020 1102   NITRITE NEGATIVE 09/08/2021 2003   LEUKOCYTESUR LARGE (A) 09/08/2021 2003   Sepsis Labs Recent Labs  Lab 09/06/21 1536 09/08/21 1605 09/09/21 0405  WBC 11.3* 11.2* 8.5   Microbiology Recent Results (from the past 240 hour(s))  SARS Coronavirus 2 by RT PCR (hospital order, performed in [facility] hospital lab) *cepheid single result test*     Status: None   Collection Time: 09/08/21  7:24 PM  Result Value Ref Range Status   SARS Coronavirus 2 by RT PCR NEGATIVE NEGATIVE Final    Comment: (NOTE) SARS-CoV-2 target nucleic acids are NOT DETECTED.  The SARS-CoV-2 RNA is generally detectable in upper and lower respiratory specimens during the acute phase of infection. The lowest concentration of SARS-CoV-2 viral copies this assay can detect is 250 copies / mL. A negative result does not preclude SARS-CoV-2 infection and should not  be used as the sole basis for treatment or other patient management decisions.  A negative result may occur with improper specimen collection / handling, submission of specimen other than nasopharyngeal swab, presence of viral mutation(s) within the areas targeted by this assay, and inadequate number of viral copies (<250 copies / mL). A negative result must be combined with clinical observations, patient history, and epidemiological information.  Fact Sheet for  Patients:   https://www.patel.info/  Fact Sheet for Healthcare Providers: https://hall.com/  This test is not yet approved or  cleared by the [place] FDA and has been authorized for detection and/or diagnosis of SARS-CoV-2 by FDA under an Emergency Use Authorization (EUA).  This EUA will remain in effect (meaning this test can be used) for the duration of the COVID-19 declaration under Section 564(b)(1) of the Act, 21 U.S.C. section 360bbb-3(b)(1), unless the authorization is terminated or revoked sooner.  Performed at [facility], [place] [address], [place], [place] [address]   Gastrointestinal Panel by PCR , Stool     Status: None   Collection Time: 09/10/21  3:41 AM   Specimen: Stool  Result Value Ref Range Status   Campylobacter species NOT DETECTED NOT DETECTED Final   Plesimonas shigelloides NOT DETECTED NOT DETECTED Final   Salmonella species NOT DETECTED NOT DETECTED Final   Yersinia enterocolitica NOT DETECTED NOT DETECTED Final   Vibrio species NOT DETECTED NOT DETECTED Final   Vibrio cholerae NOT DETECTED NOT DETECTED Final   Enteroaggregative E coli (EAEC) NOT DETECTED NOT DETECTED Final   Enteropathogenic E coli (EPEC) NOT DETECTED NOT DETECTED Final   Enterotoxigenic E coli (ETEC) NOT DETECTED NOT DETECTED Final   Shiga like toxin producing E coli (STEC) NOT DETECTED NOT DETECTED Final   Shigella/Enteroinvasive E coli (EIEC) NOT DETECTED NOT DETECTED  Final   Cryptosporidium NOT DETECTED NOT DETECTED Final   Cyclospora cayetanensis NOT DETECTED NOT DETECTED Final   Entamoeba histolytica NOT DETECTED NOT DETECTED Final   Giardia lamblia NOT DETECTED NOT DETECTED Final   Adenovirus F40/41 NOT DETECTED NOT DETECTED Final   Astrovirus NOT DETECTED NOT DETECTED Final   Norovirus GI/GII NOT DETECTED NOT DETECTED Final   Rotavirus A NOT DETECTED NOT DETECTED Final   Sapovirus (I, II, IV, and V) NOT DETECTED NOT DETECTED Final    Comment: Performed at [facility], [address] [place], [place] [address]  C Difficile Quick Screen w PCR reflex     Status: None   Collection Time: 09/10/21  3:41 AM   Specimen: STOOL  Result Value Ref Range Status   C Diff antigen NEGATIVE NEGATIVE Final   C Diff toxin NEGATIVE NEGATIVE Final   C Diff interpretation No C. difficile detected.  Final    Comment: Performed at [facility], [place] [address], [place], [place] [address]     Time coordinating discharge:  39  minutes  SIGNED  Georgette Shell, MD  [facility] 09/11/2021, 4:25 PM

## 2021-09-11 NOTE — Telephone Encounter (Signed)
Transition Care Management Follow-up Telephone Call Date of discharge and from where: [facility] 09-10-21 Dx: AKI How have you been since you were released from the hospital? Doing ok  Any questions or concerns? No  Items Reviewed: Did the pt receive and understand the discharge instructions provided? Yes  Medications obtained and verified? Yes  Other? No  Any new allergies since your discharge? No  Dietary orders reviewed? Yes Do you have support at home? Yes   Home Care and Equipment/Supplies: Were home health services ordered? no If so, what is the name of the agency? na Has the agency set up a time to come to the patient's home? not applicable Were any new equipment or medical supplies ordered?  No What is the name of the medical supply agency? na Were you able to get the supplies/equipment? not applicable Do you have any questions related to the use of the equipment or supplies? no  Functional Questionnaire: (I = Independent and D = Dependent) ADLs: I  Bathing/Dressing- I  Meal Prep- I  Eating- I  Maintaining continence- I  Transferring/Ambulation- I  Managing Meds- I  Follow up appointments reviewed:  PCP Hospital f/u appt confirmed? No- pt only wants to see Dr Ronnald Ramp- no appts available- pt needs to be seen by 09-24-21- will ask front desk staff to call pt to schedule appt  [facility] f/u appt confirmed? No  . Are transportation arrangements needed? No  If their condition worsens, is the pt aware to call PCP or go to the Emergency Dept.? Yes Was the patient provided with contact information for the PCP's office or ED? Yes Was to pt encouraged to call back with questions or concerns? Yes

## 2021-09-15 ENCOUNTER — Other Ambulatory Visit (HOSPITAL_COMMUNITY): Payer: Self-pay

## 2021-09-15 ENCOUNTER — Encounter: Payer: Self-pay | Admitting: Internal Medicine

## 2021-09-15 ENCOUNTER — Ambulatory Visit: Payer: 59 | Admitting: Internal Medicine

## 2021-09-15 DIAGNOSIS — E559 Vitamin D deficiency, unspecified: Secondary | ICD-10-CM | POA: Insufficient documentation

## 2021-09-15 DIAGNOSIS — I10 Essential (primary) hypertension: Secondary | ICD-10-CM | POA: Diagnosis not present

## 2021-09-15 DIAGNOSIS — N1831 Chronic kidney disease, stage 3a: Secondary | ICD-10-CM

## 2021-09-15 MED ORDER — VALSARTAN 80 MG PO TABS: 80.0000 mg | ORAL_TABLET | Freq: Two times a day (BID) | ORAL | 1 refills | Status: DC

## 2021-09-15 MED ORDER — MAGNESIUM 500 MG PO TABS
500.0000 mg | ORAL_TABLET | Freq: Two times a day (BID) | ORAL | 1 refills | Status: DC
  Filled 2021-09-15: qty 60, fill #0

## 2021-09-15 MED ORDER — VALSARTAN 80 MG PO TABS
80.0000 mg | ORAL_TABLET | Freq: Two times a day (BID) | ORAL | 1 refills | Status: DC
  Filled 2021-09-15 – 2021-10-15 (×2): qty 180, 90d supply, fill #0
  Filled 2022-01-13: qty 180, 90d supply, fill #1

## 2021-09-15 MED ORDER — NEBIVOLOL HCL 10 MG PO TABS
10.0000 mg | ORAL_TABLET | Freq: Every day | ORAL | 1 refills | Status: DC
  Filled 2021-09-15: qty 75, 75d supply, fill #0
  Filled 2021-09-15: qty 15, 15d supply, fill #0
  Filled 2021-12-17: qty 90, 90d supply, fill #1

## 2021-09-15 MED ORDER — AMLODIPINE BESYLATE 5 MG PO TABS
5.0000 mg | ORAL_TABLET | Freq: Every day | ORAL | 1 refills | Status: DC
  Filled 2021-09-15 – 2021-11-17 (×2): qty 90, 90d supply, fill #0

## 2021-09-15 MED ORDER — AMLODIPINE BESYLATE 5 MG PO TABS: 5.0000 mg | ORAL_TABLET | Freq: Every day | ORAL | 1 refills | Status: DC

## 2021-09-15 NOTE — Assessment & Plan Note (Signed)
Re-start Vit D 

## 2021-09-15 NOTE — Progress Notes (Signed)
Subjective:  Patient ID: Joann Bond, female    DOB: September 25, 1965  Age: 56 y.o. MRN: [id number]  CC: Follow-up [facility] f/u- Pt states she can not take the cefdinir cause nausea) and Hypertension (Pt states her BP has been elevated started back on the amlodipine & Valsartan)   HPI Joann Bond presents for a follow-up (Hosp f/u- Pt states she can not take the cefdinir cause nausea) and Hypertension (Pt states her BP has been elevated started back on the amlodipine & Valsartan)  C/o HTN  Per hx:  "Admit date: 09/08/2021 Discharge date: 09/10/2021 Admitted From:home Disposition:  home Recommendations for Outpatient Follow-up:  Follow up with PCP in 1-2 weeks Please obtain BMP/CBC in one week   Home Health: None Equipment/Devices: None  discharge Condition: Stable  CODE STATUS full code Diet recommendation: Heart healthy Brief/Interim Summary: 56 year old female with history of partial right nephrectomy in July 2023 for a mass in the right kidney pathology 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       Discharge Diagnoses:  Principal Problem:   AKI (acute kidney injury) ([place]) Active Problems:   Nausea vomiting and diarrhea   Essential hypertension   Spinal stenosis   Right renal mass      #1 AKI likely due to volume depletion prerenal nausea vomiting and diarrhea.  She was admitted with a creatinine of 3.2 which improved with IV hydration.  Creatinine was 1.0 on discharge.   #2 nausea vomiting diarrhea resolved.  CT of the abdomen and pelvis was unremarkable.   #3 right renal mass status post partial nephrectomy in July follow-up with oncology as an outpatient.   #4 essential hypertension since Her blood pressure was soft Norvasc was stopped on discharge.  Bystolic dose was decreased to 5 mg daily. Valsartan  was stopped on discharge. Follow-up  blood pressure with PCP as an outpatient and restart these medications if needed.   #5 spinal stenosis stable    #6 UTI she was discharged on cefdinir urine culture was pending at the time of discharge."    Outpatient Medications Prior to Visit  Medication Sig Dispense Refill   gabapentin (NEURONTIN) 300 MG capsule Take 1 capsule (300 mg total) by mouth 3 (three) times daily. 270 capsule 0   ondansetron (ZOFRAN) 4 MG tablet Take 1 tablet (4 mg total) by mouth every 8 (eight) hours as needed for nausea or vomiting. (Patient not taking: Reported on 09/15/2021) 20 tablet 0   [START ON 10/04/2021] oxyCODONE-acetaminophen (PERCOCET/ROXICET) 5-325 MG tablet Take 1 tablet by mouth every 8 (eight) hours as needed for pain for up to 30 days. (max daily amount 3 tablets) 90 tablet 0   tizanidine (ZANAFLEX) 2 MG capsule Take 1 capsule (2 mg total) by mouth 3 (three) times daily. (Patient not taking: Reported on 09/15/2021) 270 capsule 0   cefdinir (OMNICEF) 300 MG capsule Take 1 capsule (300 mg total) by mouth 2 (two) times daily for 4 days. (Patient not taking: Reported on 09/15/2021) 8 capsule 0   nebivolol (BYSTOLIC) 5 MG tablet Take 1 tablet (5 mg total) by mouth daily. (Patient taking differently: Take 5 mg by mouth every evening.) 30 tablet 0   No facility-administered medications prior to visit.    ROS: Review of Systems  Constitutional:  Positive for fatigue. Negative for activity change, appetite change, chills and unexpected weight change.  HENT:  Negative for congestion, mouth sores and sinus pressure.   Eyes:  Negative for visual disturbance.  Respiratory:  Negative for cough, chest tightness and shortness of breath.   Cardiovascular:  Positive for leg swelling.  Gastrointestinal:  Negative for abdominal pain and nausea.  Genitourinary:  Negative for difficulty urinating, frequency and vaginal pain.  Musculoskeletal:  Negative for back pain and gait problem.  Skin:  Negative for pallor and  rash.  Neurological:  Negative for dizziness, tremors, weakness, numbness and headaches.  Psychiatric/Behavioral:  Negative for confusion and sleep disturbance.     Objective:  BP (!) 172/84 (BP Location: Left Arm)   Pulse 80   Temp 98.4 F (36.9 C) (Oral)   Ht '5\' 2"'$  (1.575 m)   Wt 149 lb 6.4 oz (67.8 kg)   SpO2 95%   BMI 27.33 kg/m   BP Readings from Last 3 Encounters:  09/15/21 (!) 172/84  09/10/21 128/79  09/06/21 (!) 136/91    Wt Readings from Last 3 Encounters:  09/15/21 149 lb 6.4 oz (67.8 kg)  07/25/21 145 lb 15.1 oz (66.2 kg)  07/14/21 146 lb (66.2 kg)    Physical Exam Constitutional:      General: She is not in acute distress.    Appearance: She is well-developed. She is obese.  HENT:     Head: Normocephalic.     Right Ear: External ear normal.     Left Ear: External ear normal.     Nose: Nose normal.  Eyes:     General:        Right eye: No discharge.        Left eye: No discharge.     Conjunctiva/sclera: Conjunctivae normal.     Pupils: Pupils are equal, round, and reactive to light.  Neck:     Thyroid: No thyromegaly.     Vascular: No JVD.     Trachea: No tracheal deviation.  Cardiovascular:     Rate and Rhythm: Normal rate and regular rhythm.     Heart sounds: Normal heart sounds.  Pulmonary:     Effort: No respiratory distress.     Breath sounds: No stridor. No wheezing.  Abdominal:     General: Bowel sounds are normal. There is no distension.     Palpations: Abdomen is soft. There is no mass.     Tenderness: There is no abdominal tenderness. There is no guarding or rebound.  Musculoskeletal:        General: No tenderness.     Cervical back: Normal range of motion and neck supple. No rigidity.     Right lower leg: Edema present.     Left lower leg: Edema present.  Lymphadenopathy:     Cervical: No cervical adenopathy.  Skin:    Findings: No erythema or rash.  Neurological:     Mental Status: She is oriented to person, place, and time.      Cranial Nerves: No cranial nerve deficit.     Motor: No abnormal muscle tone.     Coordination: Coordination normal.     Deep Tendon Reflexes: Reflexes normal.  Psychiatric:        Behavior: Behavior normal.        Thought Content: Thought content normal.        Judgment: Judgment normal.   Scars are healed Trace edema B feet  Lab Results  Component Value Date   WBC 8.5 09/09/2021   HGB 11.0 (L) 09/09/2021   HCT 34.9 (L) 09/09/2021  PLT 236 09/09/2021   GLUCOSE 115 (H) 09/10/2021   CHOL 223 (H) 01/24/2020   TRIG 279.0 (H) 01/24/2020   HDL 55.70 01/24/2020   LDLDIRECT 137.0 01/24/2020   ALT 19 09/08/2021   AST 16 09/08/2021   NA 144 09/10/2021   K 3.9 09/10/2021   CL 112 (H) 09/10/2021   CREATININE 1.03 (H) 09/10/2021   BUN 12 09/10/2021   CO2 23 09/10/2021   TSH 0.98 08/21/2020    CT ABDOMEN PELVIS WO CONTRAST  Result Date: 09/08/2021 CLINICAL DATA:  Nausea, vomiting, renal cell carcinoma status post partial right nephrectomy EXAM: CT ABDOMEN AND PELVIS WITHOUT CONTRAST TECHNIQUE: Multidetector CT imaging of the abdomen and pelvis was performed following the standard protocol without IV contrast. RADIATION DOSE REDUCTION: This exam was performed according to the departmental dose-optimization program which includes automated exposure control, adjustment of the mA and/or kV according to patient size and/or use of iterative reconstruction technique. COMPARISON:  MRI 04/07/2021 FINDINGS: Lower chest: Mild left basilar parenchymal scarring. No acute abnormality. Hepatobiliary: No focal liver abnormality is seen. No gallstones, gallbladder wall thickening, or biliary dilatation. Pancreas: Unremarkable Spleen: Unremarkable Adrenals/Urinary Tract: The adrenal glands are unremarkable. The kidneys are normal in size and position. Interval partial right nephrectomy with resection of the right lower pole cystic renal mass noted on prior MRI examination. The kidneys are otherwise  unremarkable. Bladder unremarkable. Stomach/Bowel: Moderate sigmoid and descending colonic diverticulosis. The stomach, small bowel, and large bowel are otherwise unremarkable. Appendix normal. No free intraperitoneal gas or fluid. Vascular/Lymphatic: No significant vascular findings are present. No enlarged abdominal or pelvic lymph nodes. Reproductive: Uterus and bilateral adnexa are unremarkable. Other: No abdominal wall hernia Musculoskeletal: Extensive thoracolumbar fusion with instrumentation with bilateral sacro are screws are again noted. No acute bone abnormality. No lytic or blastic bone lesion. IMPRESSION: 1. No acute intra-abdominal pathology identified. No definite radiographic explanation for the patient's reported symptoms. 2. Interval partial right nephrectomy. No evidence of residual or recurrent disease. 3. Moderate distal colonic diverticulosis. Electronically Signed   By: Fidela Salisbury M.D.   On: 09/08/2021 21:30    Assessment & Plan:   Problem List Items Addressed This Visit     Essential hypertension (Chronic)    Not better Increase Bystolic to 10 mg a day Cont w/Norvasc 5 mg/d and Diovan 80 mg bid RTC in 3 d Off work till Sat      Relevant Medications   nebivolol (BYSTOLIC) 10 MG tablet   valsartan (DIOVAN) 80 MG tablet   amLODipine (NORVASC) 5 MG tablet   Hypomagnesemia    Start Mag 500 mg bid Re-check in 3 d      Primary hypertension    Not better Increase Bystolic to 10 mg a day Cont w/Norvasc 5 mg/d and Diovan 80 mg bid RTC in 3 d Off work till Sat       Relevant Medications   nebivolol (BYSTOLIC) 10 MG tablet   valsartan (DIOVAN) 80 MG tablet   amLODipine (NORVASC) 5 MG tablet   Other Relevant Orders   Comprehensive metabolic panel   Magnesium   Urinalysis   Stage 3a chronic kidney disease ([place])    Hydrate well CMET in 3 d      Vitamin D deficiency    Re-start Vit D         Meds ordered this encounter  Medications   DISCONTD:  valsartan (DIOVAN) 80 MG tablet    Sig: Take 1 tablet (80 mg total) by mouth  2 (two) times daily.    Dispense:  180 tablet    Refill:  1   DISCONTD: amLODipine (NORVASC) 5 MG tablet    Sig: Take 1 tablet (5 mg total) by mouth daily.    Dispense:  90 tablet    Refill:  1   nebivolol (BYSTOLIC) 10 MG tablet    Sig: Take 1 tablet (10 mg total) by mouth daily.    Dispense:  90 tablet    Refill:  1   valsartan (DIOVAN) 80 MG tablet    Sig: Take 1 tablet (80 mg total) by mouth 2 (two) times daily.    Dispense:  180 tablet    Refill:  1   amLODipine (NORVASC) 5 MG tablet    Sig: Take 1 tablet (5 mg total) by mouth daily.    Dispense:  90 tablet    Refill:  1   Magnesium 500 MG TABS    Sig: Take 1 tablet (500 mg total) by mouth 2 (two) times daily.    Dispense:  60 tablet    Refill:  1      Follow-up: Return in about 3 days (around 09/18/2021) for a follow-up visit.  Walker Kehr, MD

## 2021-09-15 NOTE — Assessment & Plan Note (Signed)
Start Mag 500 mg bid Re-check in 3 d

## 2021-09-15 NOTE — Assessment & Plan Note (Signed)
Hydrate well CMET in 3 d

## 2021-09-15 NOTE — Assessment & Plan Note (Signed)
Not better Increase Bystolic to 10 mg a day Cont w/Norvasc 5 mg/d and Diovan 80 mg bid RTC in 3 d Off work till Sat

## 2021-09-15 NOTE — Assessment & Plan Note (Addendum)
Not better Increase Bystolic to 10 mg a day Cont w/Norvasc 5 mg/d and Diovan 80 mg bid RTC in 3 d Off work till Sat

## 2021-09-17 ENCOUNTER — Inpatient Hospital Stay: Payer: 59 | Admitting: Internal Medicine

## 2021-09-17 ENCOUNTER — Inpatient Hospital Stay: Payer: 59 | Admitting: Family Medicine

## 2021-09-17 ENCOUNTER — Telehealth: Payer: Self-pay

## 2021-09-17 LAB — URINALYSIS
Bilirubin Urine: NEGATIVE
Hgb urine dipstick: NEGATIVE
Ketones, ur: NEGATIVE
Leukocytes,Ua: NEGATIVE
Nitrite: NEGATIVE
Specific Gravity, Urine: <=1.005 — AB (ref 1.000–1.030)
Total Protein, Urine: NEGATIVE
Urine Glucose: NEGATIVE
Urobilinogen, UA: 0.2 (ref 0.0–1.0)
pH: 7 (ref 5.0–8.0)

## 2021-09-17 LAB — COMPREHENSIVE METABOLIC PANEL
ALT: 37 U/L — ABNORMAL HIGH (ref 0–35)
AST: 16 U/L (ref 0–37)
Albumin: 4.1 g/dL (ref 3.5–5.2)
Alkaline Phosphatase: 74 U/L (ref 39–117)
BUN: 22 mg/dL (ref 6–23)
CO2: 32 mEq/L (ref 19–32)
Calcium: 9.5 mg/dL (ref 8.4–10.5)
Chloride: 101 mEq/L (ref 96–112)
Creatinine, Ser: 1.05 mg/dL (ref 0.40–1.20)
GFR: 59.35 mL/min — ABNORMAL LOW (ref >60.00)
Glucose, Bld: 119 mg/dL — ABNORMAL HIGH (ref 70–99)
Potassium: 4.1 mEq/L (ref 3.5–5.1)
Sodium: 140 mEq/L (ref 135–145)
Total Bilirubin: 0.4 mg/dL (ref 0.2–1.2)
Total Protein: 6.8 g/dL (ref 6.0–8.3)

## 2021-09-17 LAB — MAGNESIUM: Magnesium: 2.5 mg/dL (ref 1.5–2.5)

## 2021-09-17 NOTE — Telephone Encounter (Signed)
FMLA received on 9/12 per cover sheet date.  LVM for pt to discuss reason on the need for FMLA prior to completion.

## 2021-09-17 NOTE — Telephone Encounter (Signed)
Pt stated that her employer no longer accepts dr notes for missed work due to appointments. She was instructed to seek FMLA for the the dates of 9/9-9/12 to cover her appointment she had on 9/11 with Dr. Alain Marion. However, she stated that she has an upcoming OV with PCP on 9/14 to discuss.  CMA will hold onto forms until that OV for completion.

## 2021-09-18 ENCOUNTER — Encounter: Payer: Self-pay | Admitting: Internal Medicine

## 2021-09-18 ENCOUNTER — Other Ambulatory Visit (HOSPITAL_COMMUNITY): Payer: Self-pay

## 2021-09-18 ENCOUNTER — Ambulatory Visit: Payer: Self-pay | Admitting: Internal Medicine

## 2021-09-18 VITALS — BP 176/94 | HR 72 | Temp 98.2°F | Resp 16 | Ht 62.0 in | Wt 147.0 lb

## 2021-09-18 DIAGNOSIS — R739 Hyperglycemia, unspecified: Secondary | ICD-10-CM | POA: Diagnosis not present

## 2021-09-18 DIAGNOSIS — N1831 Chronic kidney disease, stage 3a: Secondary | ICD-10-CM

## 2021-09-18 DIAGNOSIS — E1121 Type 2 diabetes mellitus with diabetic nephropathy: Secondary | ICD-10-CM | POA: Diagnosis not present

## 2021-09-18 DIAGNOSIS — I10 Essential (primary) hypertension: Secondary | ICD-10-CM

## 2021-09-18 DIAGNOSIS — Z0289 Encounter for other administrative examinations: Secondary | ICD-10-CM

## 2021-09-18 DIAGNOSIS — D539 Nutritional anemia, unspecified: Secondary | ICD-10-CM

## 2021-09-18 DIAGNOSIS — Z91038 Other insect allergy status: Secondary | ICD-10-CM | POA: Diagnosis not present

## 2021-09-18 LAB — IBC + FERRITIN
Ferritin: 58.9 ng/mL (ref 10.0–291.0)
Iron: 58 ug/dL (ref 42–145)
Saturation Ratios: 16.5 % — ABNORMAL LOW (ref 20.0–50.0)
TIBC: 351.4 ug/dL (ref 250.0–450.0)
Transferrin: 251 mg/dL (ref 212.0–360.0)

## 2021-09-18 LAB — HEMOGLOBIN A1C: Hgb A1c MFr Bld: 6.6 % — ABNORMAL HIGH (ref 4.6–6.5)

## 2021-09-18 LAB — VITAMIN B12: Vitamin B-12: 273 pg/mL (ref 211–911)

## 2021-09-18 LAB — FOLATE: Folate: 12.3 ng/mL (ref >5.9)

## 2021-09-18 LAB — TSH: TSH: 1.83 u[IU]/mL (ref 0.35–5.50)

## 2021-09-18 MED ORDER — METHYLPREDNISOLONE ACETATE 80 MG/ML IJ SUSP
120.0000 mg | Freq: Once | INTRAMUSCULAR | Status: AC
  Administered 2021-09-18: 120 mg via INTRAMUSCULAR

## 2021-09-18 MED ORDER — HYDRALAZINE HCL 25 MG PO TABS
25.0000 mg | ORAL_TABLET | Freq: Three times a day (TID) | ORAL | 0 refills | Status: DC
  Filled 2021-09-18: qty 270, 90d supply, fill #0

## 2021-09-18 MED ORDER — EMPAGLIFLOZIN 10 MG PO TABS
10.0000 mg | ORAL_TABLET | Freq: Every day | ORAL | 0 refills | Status: DC
  Filled 2021-09-18: qty 90, 90d supply, fill #0

## 2021-09-18 NOTE — Progress Notes (Signed)
Subjective:  Patient ID: Joann Bond, female    DOB: 1965-11-18  Age: 56 y.o. MRN: [id number]  CC: Hypertension   HPI Jadah Bobak presents for f/up -  She tells me that she got bitten by bugs about 3 days ago on her left forearm.  She now has pain, redness, and swelling.  She is got minimal symptom relief with Benadryl.  She is also concerned that her blood pressure is not well controlled.  She has had a few headaches but she denies blurred vision, chest pain, shortness of breath, or edema.  Outpatient Medications Prior to Visit  Medication Sig Dispense Refill   amLODipine (NORVASC) 5 MG tablet Take 1 tablet (5 mg total) by mouth daily. 90 tablet 1   gabapentin (NEURONTIN) 300 MG capsule Take 1 capsule (300 mg total) by mouth 3 (three) times daily. 270 capsule 0   Magnesium 500 MG TABS Take 1 tablet (500 mg total) by mouth 2 (two) times daily. 60 tablet 1   nebivolol (BYSTOLIC) 10 MG tablet Take 1 tablet (10 mg total) by mouth daily. 90 tablet 1   ondansetron (ZOFRAN) 4 MG tablet Take 1 tablet (4 mg total) by mouth every 8 (eight) hours as needed for nausea or vomiting. 20 tablet 0   [START ON 10/04/2021] oxyCODONE-acetaminophen (PERCOCET/ROXICET) 5-325 MG tablet Take 1 tablet by mouth every 8 (eight) hours as needed for pain for up to 30 days. (max daily amount 3 tablets) 90 tablet 0   tizanidine (ZANAFLEX) 2 MG capsule Take 1 capsule (2 mg total) by mouth 3 (three) times daily. 270 capsule 0   valsartan (DIOVAN) 80 MG tablet Take 1 tablet (80 mg total) by mouth 2 (two) times daily. 180 tablet 1   No facility-administered medications prior to visit.    ROS Review of Systems  Constitutional:  Negative for diaphoresis and fatigue.  HENT: Negative.    Eyes: Negative.   Cardiovascular:  Negative for chest pain, palpitations and leg swelling.  Gastrointestinal:  Negative for abdominal pain, constipation, diarrhea, nausea and vomiting.  Genitourinary: Negative.  Negative for  difficulty urinating and dysuria.  Musculoskeletal: Negative.  Negative for back pain, myalgias and neck pain.  Skin:  Positive for color change and rash.  Allergic/Immunologic: Negative.   Neurological:  Positive for headaches. Negative for dizziness, weakness, light-headedness and numbness.  Hematological:  Negative for adenopathy. Does not bruise/bleed easily.  Psychiatric/Behavioral: Negative.      Objective:  BP (!) 176/94 (BP Location: Left Arm, Patient Position: Sitting, Cuff Size: Large)   Pulse 72   Temp 98.2 F (36.8 C) (Oral)   Resp 16   Ht '5\' 2"'$  (1.575 m)   Wt 147 lb (66.7 kg)   SpO2 94%   BMI 26.89 kg/m   BP Readings from Last 3 Encounters:  09/18/21 (!) 176/94  09/15/21 (!) 172/84  09/10/21 128/79    Wt Readings from Last 3 Encounters:  09/18/21 147 lb (66.7 kg)  09/15/21 149 lb 6.4 oz (67.8 kg)  07/25/21 145 lb 15.1 oz (66.2 kg)    Physical Exam Vitals reviewed.  Constitutional:      Appearance: She is not ill-appearing.  HENT:     Mouth/Throat:     Mouth: Mucous membranes are moist.  Eyes:     General: No scleral icterus.    Conjunctiva/sclera: Conjunctivae normal.  Cardiovascular:     Rate and Rhythm: Normal rate and regular rhythm.     Pulses:  Carotid pulses are 1+ on the right side and 1+ on the left side.      Radial pulses are 1+ on the right side and 1+ on the left side.       Femoral pulses are 1+ on the right side and 1+ on the left side.      Popliteal pulses are 1+ on the right side and 1+ on the left side.       Dorsalis pedis pulses are 1+ on the right side and 1+ on the left side.       Posterior tibial pulses are 1+ on the right side and 1+ on the left side.     Heart sounds: Normal heart sounds, S1 normal and S2 normal.     No gallop.     Comments: EKG- NSR, 68 bpm ?LAE No LVH, Q waves, or ST/T wave changes Pulmonary:     Breath sounds: No stridor. No wheezing, rhonchi or rales.  Abdominal:     General: Abdomen is  flat.     Palpations: There is no mass.     Tenderness: There is no abdominal tenderness. There is no guarding.     Hernia: No hernia is present.  Musculoskeletal:     Cervical back: Neck supple.     Right lower leg: No edema.     Left lower leg: No edema.  Skin:    General: Skin is warm and dry.     Findings: Erythema and rash present.     Comments: Left forearm is diffusely swollen.  There are 2 erythematous macules with a central excoriation.  Neurological:     General: No focal deficit present.     Mental Status: She is alert.  Psychiatric:        Mood and Affect: Mood normal.        Behavior: Behavior normal.     Lab Results  Component Value Date   WBC 8.5 09/09/2021   HGB 11.0 (L) 09/09/2021   HCT 34.9 (L) 09/09/2021   PLT 236 09/09/2021   GLUCOSE 119 (H) 09/17/2021   CHOL 223 (H) 01/24/2020   TRIG 279.0 (H) 01/24/2020   HDL 55.70 01/24/2020   LDLDIRECT 137.0 01/24/2020   ALT 37 (H) 09/17/2021   AST 16 09/17/2021   NA 140 09/17/2021   K 4.1 09/17/2021   CL 101 09/17/2021   CREATININE 1.05 09/17/2021   BUN 22 09/17/2021   CO2 32 09/17/2021   TSH 1.83 09/18/2021   HGBA1C 6.6 (H) 09/18/2021    CT ABDOMEN PELVIS WO CONTRAST  Result Date: 09/08/2021 CLINICAL DATA:  Nausea, vomiting, renal cell carcinoma status post partial right nephrectomy EXAM: CT ABDOMEN AND PELVIS WITHOUT CONTRAST TECHNIQUE: Multidetector CT imaging of the abdomen and pelvis was performed following the standard protocol without IV contrast. RADIATION DOSE REDUCTION: This exam was performed according to the departmental dose-optimization program which includes automated exposure control, adjustment of the mA and/or kV according to patient size and/or use of iterative reconstruction technique. COMPARISON:  MRI 04/07/2021 FINDINGS: Lower chest: Mild left basilar parenchymal scarring. No acute abnormality. Hepatobiliary: No focal liver abnormality is seen. No gallstones, gallbladder wall thickening, or  biliary dilatation. Pancreas: Unremarkable Spleen: Unremarkable Adrenals/Urinary Tract: The adrenal glands are unremarkable. The kidneys are normal in size and position. Interval partial right nephrectomy with resection of the right lower pole cystic renal mass noted on prior MRI examination. The kidneys are otherwise unremarkable. Bladder unremarkable. Stomach/Bowel: Moderate sigmoid and descending colonic diverticulosis.  The stomach, small bowel, and large bowel are otherwise unremarkable. Appendix normal. No free intraperitoneal gas or fluid. Vascular/Lymphatic: No significant vascular findings are present. No enlarged abdominal or pelvic lymph nodes. Reproductive: Uterus and bilateral adnexa are unremarkable. Other: No abdominal wall hernia Musculoskeletal: Extensive thoracolumbar fusion with instrumentation with bilateral sacro are screws are again noted. No acute bone abnormality. No lytic or blastic bone lesion. IMPRESSION: 1. No acute intra-abdominal pathology identified. No definite radiographic explanation for the patient's reported symptoms. 2. Interval partial right nephrectomy. No evidence of residual or recurrent disease. 3. Moderate distal colonic diverticulosis. Electronically Signed   By: Fidela Salisbury M.D.   On: 09/08/2021 21:30    Assessment & Plan:   Haasini was seen today for hypertension.  Diagnoses and all orders for this visit:  Malignant hypertension- Will evaluate for secondary causes and will start hydralazine. -     Cancel: Basic metabolic panel; Future -     TSH; Future -     Cancel: Urinalysis, Routine w reflex microscopic; Future -     Cancel: Hepatic function panel; Future -     Aldosterone + renin activity w/ ratio; Future -     EKG 12-Lead -     Aldosterone + renin activity w/ ratio -     TSH -     hydrALAZINE (APRESOLINE) 25 MG tablet; Take 1 tablet (25 mg total) by mouth 3 (three) times daily.  Allergic reaction to insect bite -     methylPREDNISolone acetate  (DEPO-MEDROL) injection 120 mg  Deficiency anemia- Will evaluate for vitamin deficiencies. -     Vitamin B12; Future -     IBC + Ferritin; Future -     Cancel: CBC with Differential/Platelet; Future -     Folate; Future -     Vitamin B1; Future -     Zinc; Future -     Zinc -     Vitamin B1 -     Folate -     IBC + Ferritin -     Vitamin B12  Chronic hyperglycemia- She has developed DM 2 with renal insufficiency.  I recommended that she start taking an SGLT2 inhibitor. -     Hemoglobin A1c; Future -     Hemoglobin A1c  Stage 3a chronic kidney disease (HCC) -     empagliflozin (JARDIANCE) 10 MG TABS tablet; Take 1 tablet (10 mg total) by mouth daily before breakfast.  Type 2 diabetes mellitus with diabetic nephropathy, without long-term current use of insulin (HCC) -     empagliflozin (JARDIANCE) 10 MG TABS tablet; Take 1 tablet (10 mg total) by mouth daily before breakfast.   I am having Joann Bond start on hydrALAZINE and empagliflozin. I am also having her maintain her tizanidine, gabapentin, oxyCODONE-acetaminophen, ondansetron, nebivolol, valsartan, amLODipine, and Magnesium. We administered methylPREDNISolone acetate.  Meds ordered this encounter  Medications   methylPREDNISolone acetate (DEPO-MEDROL) injection 120 mg   hydrALAZINE (APRESOLINE) 25 MG tablet    Sig: Take 1 tablet (25 mg total) by mouth 3 (three) times daily.    Dispense:  270 tablet    Refill:  0   empagliflozin (JARDIANCE) 10 MG TABS tablet    Sig: Take 1 tablet (10 mg total) by mouth daily before breakfast.    Dispense:  90 tablet    Refill:  0     Follow-up: Return in about 6 weeks (around 10/30/2021).  Scarlette Calico, MD

## 2021-09-18 NOTE — Patient Instructions (Signed)
Hypertension, Adult High blood pressure (hypertension) is when the force of blood pumping through the arteries is too strong. The arteries are the blood vessels that carry blood from the heart throughout the body. Hypertension forces the heart to work harder to pump blood and may cause arteries to become narrow or stiff. Untreated or uncontrolled hypertension can lead to a heart attack, heart failure, a stroke, kidney disease, and other problems. A blood pressure reading consists of a higher number over a lower number. Ideally, your blood pressure should be below 120/80. The first ("top") number is called the systolic pressure. It is a measure of the pressure in your arteries as your heart beats. The second ("bottom") number is called the diastolic pressure. It is a measure of the pressure in your arteries as the heart relaxes. What are the causes? The exact cause of this condition is not known. There are some conditions that result in high blood pressure. What increases the risk? Certain factors may make you more likely to develop high blood pressure. Some of these risk factors are under your control, including: Smoking. Not getting enough exercise or physical activity. Being overweight. Having too much fat, sugar, calories, or salt (sodium) in your diet. Drinking too much alcohol. Other risk factors include: Having a personal history of heart disease, diabetes, high cholesterol, or kidney disease. Stress. Having a family history of high blood pressure and high cholesterol. Having obstructive sleep apnea. Age. The risk increases with age. What are the signs or symptoms? High blood pressure may not cause symptoms. Very high blood pressure (hypertensive crisis) may cause: Headache. Fast or irregular heartbeats (palpitations). Shortness of breath. Nosebleed. Nausea and vomiting. Vision changes. Severe chest pain, dizziness, and seizures. How is this diagnosed? This condition is diagnosed by  measuring your blood pressure while you are seated, with your arm resting on a flat surface, your legs uncrossed, and your feet flat on the floor. The cuff of the blood pressure monitor will be placed directly against the skin of your upper arm at the level of your heart. Blood pressure should be measured at least twice using the same arm. Certain conditions can cause a difference in blood pressure between your right and left arms. If you have a high blood pressure reading during one visit or you have normal blood pressure with other risk factors, you may be asked to: Return on a different day to have your blood pressure checked again. Monitor your blood pressure at home for 1 week or longer. If you are diagnosed with hypertension, you may have other blood or imaging tests to help your health care provider understand your overall risk for other conditions. How is this treated? This condition is treated by making healthy lifestyle changes, such as eating healthy foods, exercising more, and reducing your alcohol intake. You may be referred for counseling on a healthy diet and physical activity. Your health care provider may prescribe medicine if lifestyle changes are not enough to get your blood pressure under control and if: Your systolic blood pressure is above 130. Your diastolic blood pressure is above 80. Your personal target blood pressure may vary depending on your medical conditions, your age, and other factors. Follow these instructions at home: Eating and drinking  Eat a diet that is high in fiber and potassium, and low in sodium, added sugar, and fat. An example of this eating plan is called the DASH diet. DASH stands for Dietary Approaches to Stop Hypertension. To eat this way: Eat   plenty of fresh fruits and vegetables. Try to fill one half of your plate at each meal with fruits and vegetables. Eat whole grains, such as whole-wheat pasta, brown rice, or whole-grain bread. Fill about one  fourth of your plate with whole grains. Eat or drink low-fat dairy products, such as skim milk or low-fat yogurt. Avoid fatty cuts of meat, processed or cured meats, and poultry with skin. Fill about one fourth of your plate with lean proteins, such as fish, chicken without skin, beans, eggs, or tofu. Avoid pre-made and processed foods. These tend to be higher in sodium, added sugar, and fat. Reduce your daily sodium intake. Many people with hypertension should eat less than 1,500 mg of sodium a day. Do not drink alcohol if: Your health care provider tells you not to drink. You are pregnant, may be pregnant, or are planning to become pregnant. If you drink alcohol: Limit how much you have to: 0-1 drink a day for women. 0-2 drinks a day for men. Know how much alcohol is in your drink. In the [place] one drink equals one 12 oz bottle of beer (355 mL), one 5 oz glass of wine (148 mL), or one 1 oz glass of hard liquor (44 mL). Lifestyle  Work with your health care provider to maintain a healthy body weight or to lose weight. Ask what an ideal weight is for you. Get at least 30 minutes of exercise that causes your heart to beat faster (aerobic exercise) most days of the week. Activities may include walking, swimming, or biking. Include exercise to strengthen your muscles (resistance exercise), such as Pilates or lifting weights, as part of your weekly exercise routine. Try to do these types of exercises for 30 minutes at least 3 days a week. Do not use any products that contain nicotine or tobacco. These products include cigarettes, chewing tobacco, and vaping devices, such as e-cigarettes. If you need help quitting, ask your health care provider. Monitor your blood pressure at home as told by your health care provider. Keep all follow-up visits. This is important. Medicines Take over-the-counter and prescription medicines only as told by your health care provider. Follow directions carefully. Blood  pressure medicines must be taken as prescribed. Do not skip doses of blood pressure medicine. Doing this puts you at risk for problems and can make the medicine less effective. Ask your health care provider about side effects or reactions to medicines that you should watch for. Contact a health care provider if you: Think you are having a reaction to a medicine you are taking. Have headaches that keep coming back (recurring). Feel dizzy. Have swelling in your ankles. Have trouble with your vision. Get help right away if you: Develop a severe headache or confusion. Have unusual weakness or numbness. Feel faint. Have severe pain in your chest or abdomen. Vomit repeatedly. Have trouble breathing. These symptoms may be an emergency. Get help right away. Call 911. Do not wait to see if the symptoms will go away. Do not drive yourself to the hospital. Summary Hypertension is when the force of blood pumping through your arteries is too strong. If this condition is not controlled, it may put you at risk for serious complications. Your personal target blood pressure may vary depending on your medical conditions, your age, and other factors. For most people, a normal blood pressure is less than 120/80. Hypertension is treated with lifestyle changes, medicines, or a combination of both. Lifestyle changes include losing weight, eating a healthy,   low-sodium diet, exercising more, and limiting alcohol. This information is not intended to replace advice given to you by your health care provider. Make sure you discuss any questions you have with your health care provider. Document Revised: 10/29/2020 Document Reviewed: 10/29/2020 Elsevier Patient Education  2023 [employer]  

## 2021-09-18 NOTE — Telephone Encounter (Signed)
FMLA has been signed and faxed back to [employer].

## 2021-09-18 NOTE — Telephone Encounter (Signed)
Forms have been completed and given to PCP to sign

## 2021-09-25 ENCOUNTER — Encounter: Payer: Self-pay | Admitting: Internal Medicine

## 2021-09-27 LAB — ALDOSTERONE + RENIN ACTIVITY W/ RATIO
ALDO / PRA Ratio: 12 Ratio (ref 0.9–28.9)
Aldosterone: 3 ng/dL
Renin Activity: 0.25 ng/mL/h (ref 0.25–5.82)

## 2021-09-27 LAB — VITAMIN B1: Vitamin B1 (Thiamine): 22 nmol/L (ref 8–30)

## 2021-09-27 LAB — ZINC: Zinc: 77 ug/dL (ref 60–130)

## 2021-10-06 ENCOUNTER — Other Ambulatory Visit (HOSPITAL_COMMUNITY): Payer: Self-pay

## 2021-10-06 IMAGING — US US RENAL
1 series · 14 of 25 positions shown · non-contrast
Comparison: None.

CLINICAL DATA: CKD

EXAM:
RENAL / URINARY TRACT ULTRASOUND COMPLETE

[Series 1: [facility] renal · 0.19mm/px · 14 of 35 slices shown]
[im 1/35]
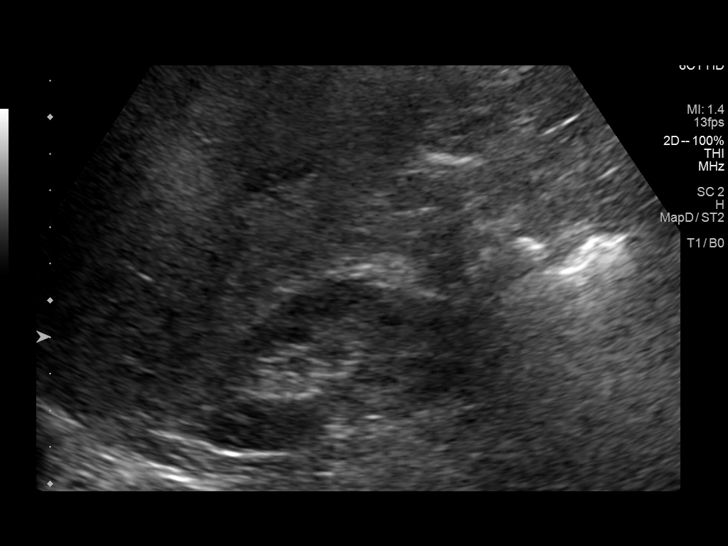
[im 3/35]
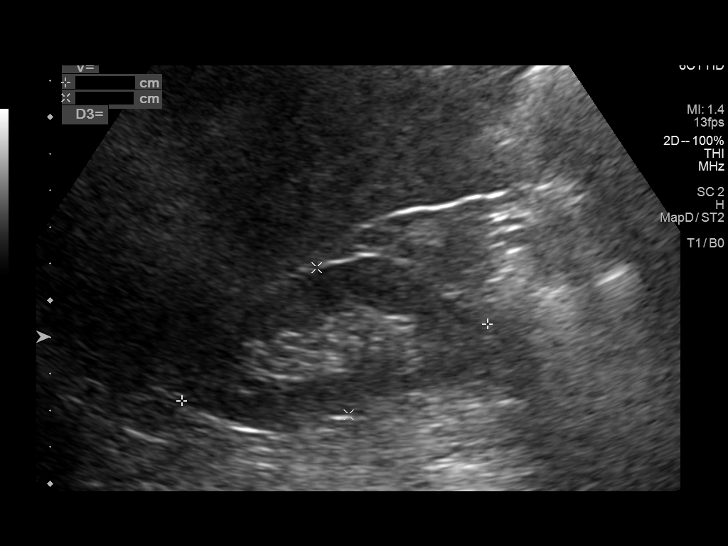
[im 6/35]
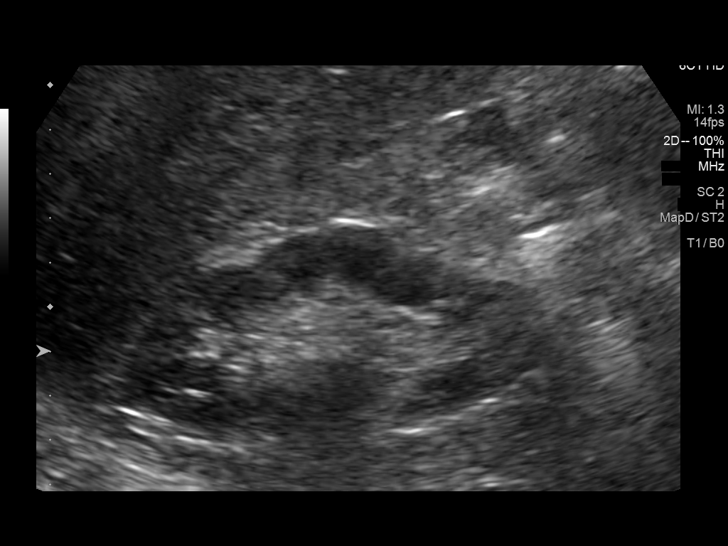
[im 9/35]
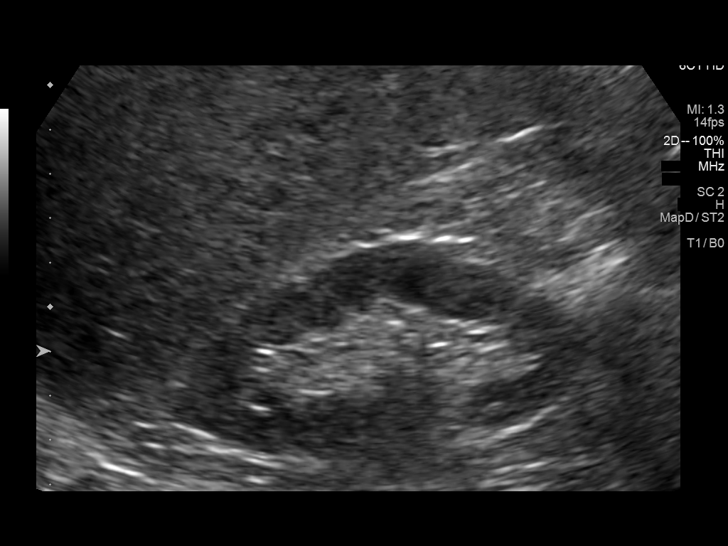
[im 12/35]
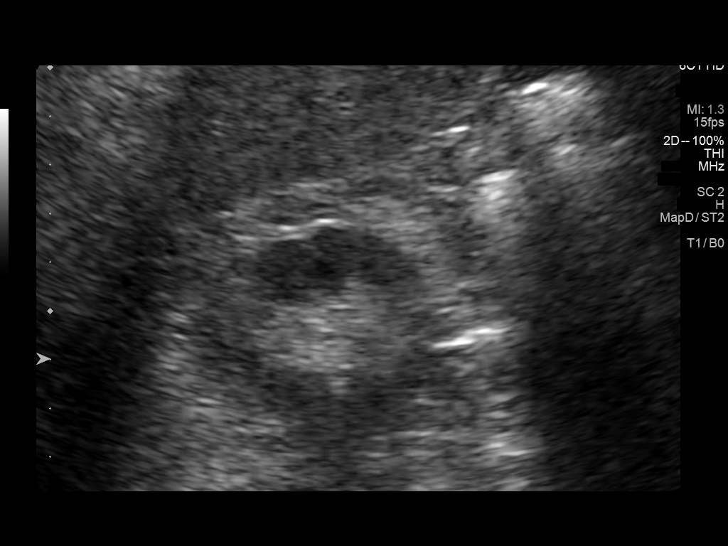
[im 13/35]
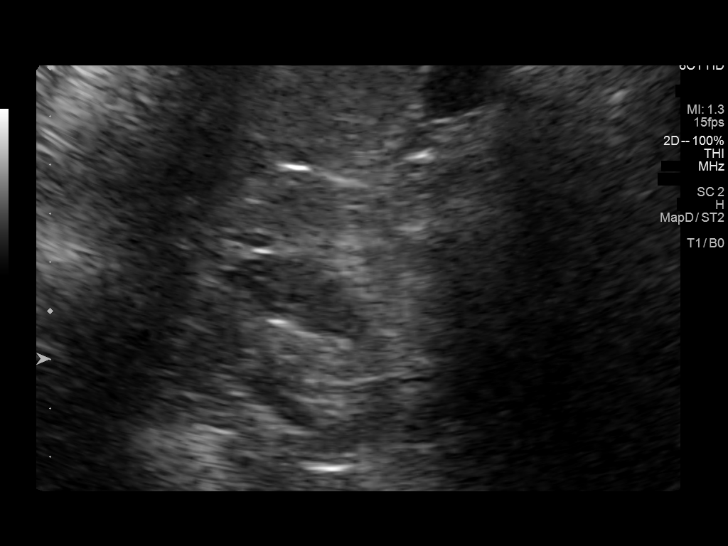
[im 16/35]
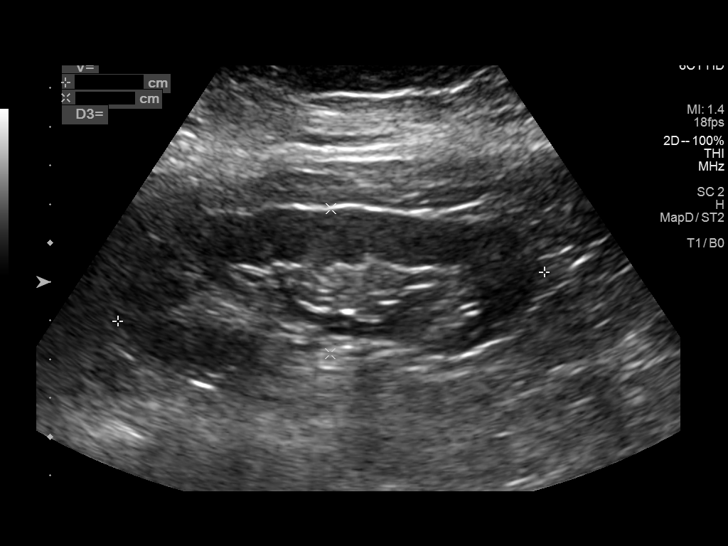
[im 19/35]
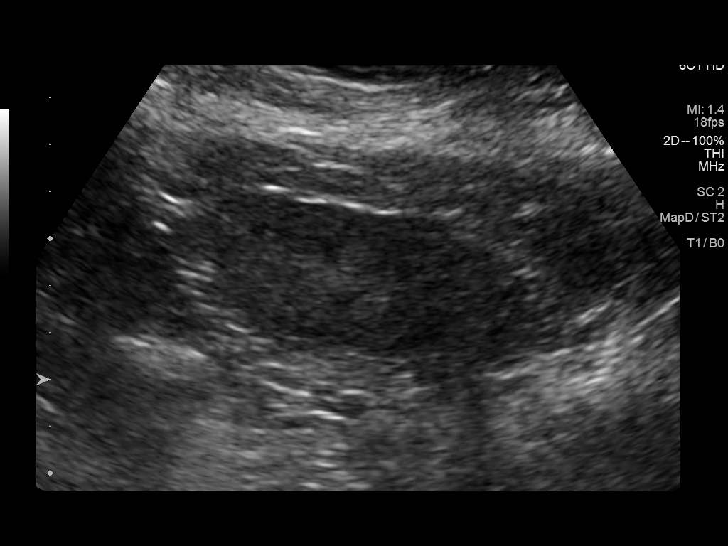
[im 22/35]
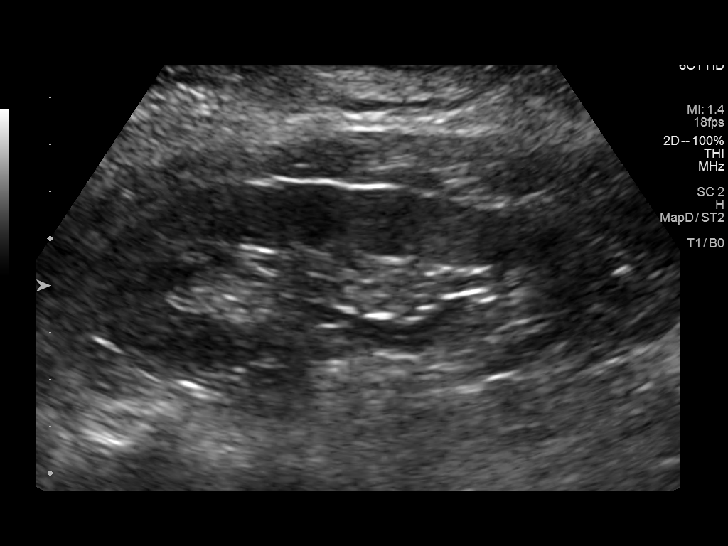
[im 23/35]
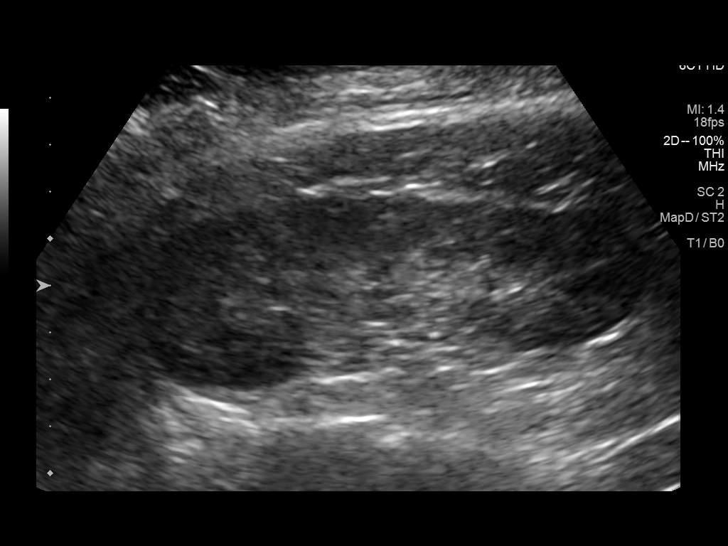
[im 26/35]
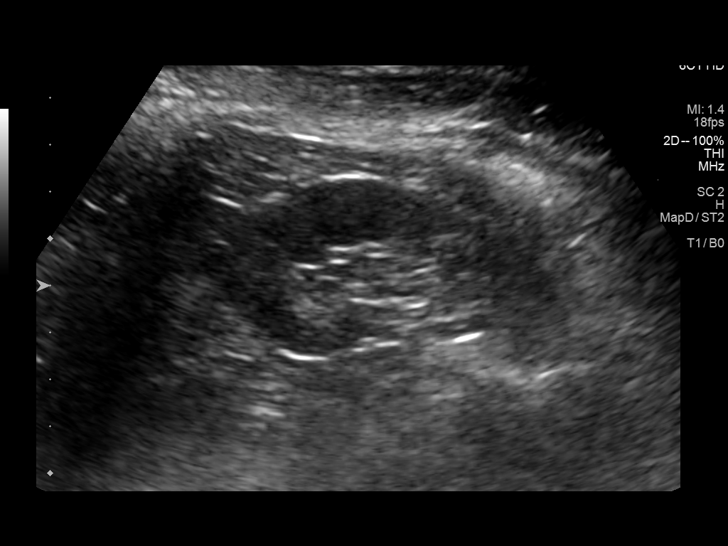
[im 29/35]
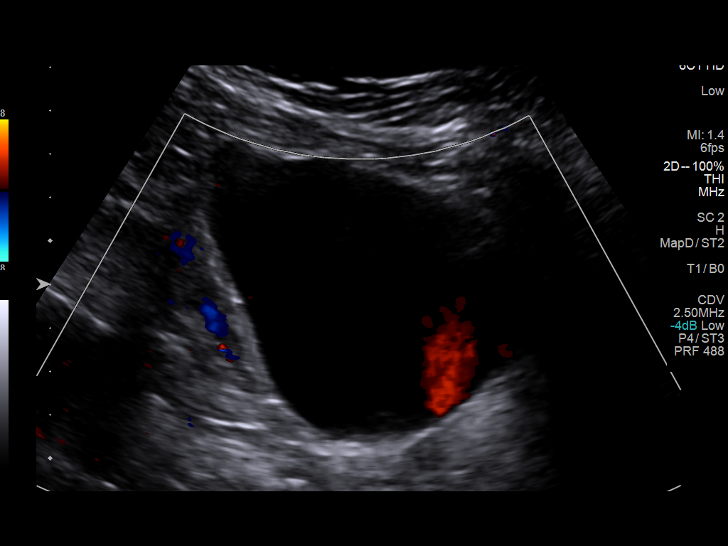
[im 32/35]
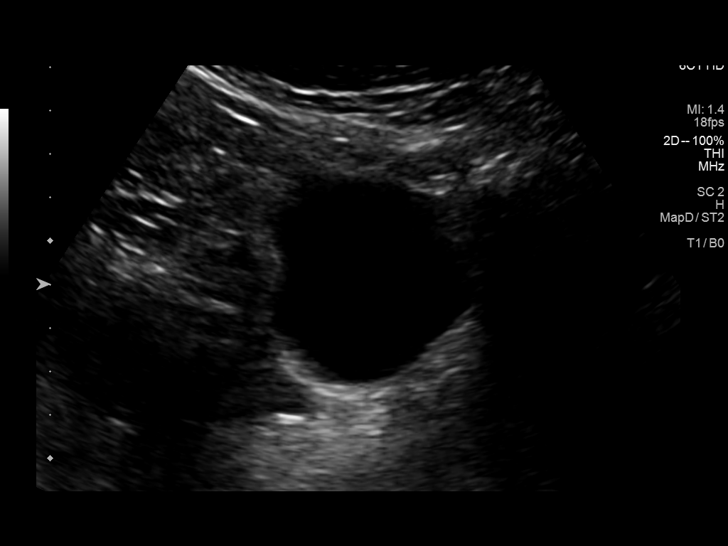
[im 35/35]
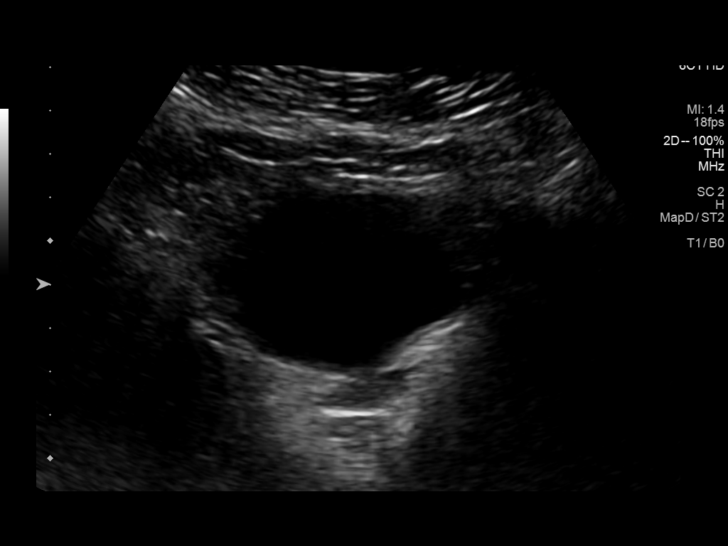

[14 of 25 positions shown; findings below may reference images not displayed]

FINDINGS: Right Kidney:

Renal measurements: 8.6 x 4.1 x 5.0 cm = volume: 93 mL. Echogenicity
within normal limits. No mass or hydronephrosis visualized.

Left Kidney:

Renal measurements: 11.1 x 3.7 x 3.6 cm = volume: 77 mL.
Echogenicity within normal limits. No mass or hydronephrosis
visualized.

Bladder:

Appears normal for degree of bladder distention.

Other:

None.
IMPRESSION: Unremarkable sonographic appearance of the bilateral kidneys and
bladder.

## 2021-10-09 ENCOUNTER — Other Ambulatory Visit (HOSPITAL_COMMUNITY): Payer: Self-pay

## 2021-10-09 MED ORDER — GABAPENTIN 300 MG PO CAPS
300.0000 mg | ORAL_CAPSULE | Freq: Three times a day (TID) | ORAL | 0 refills | Status: DC
  Filled 2021-10-09: qty 270, 90d supply, fill #0

## 2021-10-09 MED ORDER — TIZANIDINE HCL 2 MG PO CAPS
2.0000 mg | ORAL_CAPSULE | Freq: Three times a day (TID) | ORAL | 0 refills | Status: DC
  Filled 2021-10-09: qty 270, 90d supply, fill #0

## 2021-10-16 ENCOUNTER — Other Ambulatory Visit (HOSPITAL_COMMUNITY): Payer: Self-pay

## 2021-10-27 DIAGNOSIS — C641 Malignant neoplasm of right kidney, except renal pelvis: Secondary | ICD-10-CM | POA: Diagnosis not present

## 2021-10-28 ENCOUNTER — Other Ambulatory Visit: Payer: Self-pay | Admitting: Urology

## 2021-10-28 DIAGNOSIS — D49511 Neoplasm of unspecified behavior of right kidney: Secondary | ICD-10-CM

## 2021-10-31 ENCOUNTER — Other Ambulatory Visit: Payer: Self-pay | Admitting: Nurse Practitioner

## 2021-10-31 DIAGNOSIS — H5213 Myopia, bilateral: Secondary | ICD-10-CM | POA: Diagnosis not present

## 2021-10-31 DIAGNOSIS — Z1231 Encounter for screening mammogram for malignant neoplasm of breast: Secondary | ICD-10-CM

## 2021-11-02 NOTE — Telephone Encounter (Signed)
Apt with PCP 11/05/21

## 2021-11-05 ENCOUNTER — Other Ambulatory Visit (HOSPITAL_COMMUNITY): Payer: Self-pay

## 2021-11-05 ENCOUNTER — Encounter: Payer: Self-pay | Admitting: Internal Medicine

## 2021-11-05 ENCOUNTER — Ambulatory Visit: Payer: 59 | Admitting: Internal Medicine

## 2021-11-05 VITALS — BP 132/84 | HR 73 | Temp 98.2°F | Resp 16 | Ht 62.0 in | Wt 148.0 lb

## 2021-11-05 DIAGNOSIS — N1831 Chronic kidney disease, stage 3a: Secondary | ICD-10-CM

## 2021-11-05 DIAGNOSIS — E785 Hyperlipidemia, unspecified: Secondary | ICD-10-CM | POA: Diagnosis not present

## 2021-11-05 DIAGNOSIS — I10 Essential (primary) hypertension: Secondary | ICD-10-CM | POA: Diagnosis not present

## 2021-11-05 DIAGNOSIS — Z23 Encounter for immunization: Secondary | ICD-10-CM | POA: Diagnosis not present

## 2021-11-05 DIAGNOSIS — E1121 Type 2 diabetes mellitus with diabetic nephropathy: Secondary | ICD-10-CM

## 2021-11-05 DIAGNOSIS — M5136 Other intervertebral disc degeneration, lumbar region: Secondary | ICD-10-CM | POA: Diagnosis not present

## 2021-11-05 DIAGNOSIS — Z79891 Long term (current) use of opiate analgesic: Secondary | ICD-10-CM | POA: Diagnosis not present

## 2021-11-05 DIAGNOSIS — G894 Chronic pain syndrome: Secondary | ICD-10-CM | POA: Diagnosis not present

## 2021-11-05 DIAGNOSIS — M47816 Spondylosis without myelopathy or radiculopathy, lumbar region: Secondary | ICD-10-CM | POA: Diagnosis not present

## 2021-11-05 LAB — CBC WITH DIFFERENTIAL/PLATELET
Basophils Absolute: 0 10*3/uL (ref 0.0–0.1)
Basophils Relative: 0.5 % (ref 0.0–3.0)
Eosinophils Absolute: 0.2 10*3/uL (ref 0.0–0.7)
Eosinophils Relative: 1.9 % (ref 0.0–5.0)
HCT: 37.8 % (ref 36.0–46.0)
Hemoglobin: 12.2 g/dL (ref 12.0–15.0)
Lymphocytes Relative: 24.9 % (ref 12.0–46.0)
Lymphs Abs: 2.4 10*3/uL (ref 0.7–4.0)
MCHC: 32.3 g/dL (ref 30.0–36.0)
MCV: 90.9 fl (ref 78.0–100.0)
Monocytes Absolute: 0.8 10*3/uL (ref 0.1–1.0)
Monocytes Relative: 8.4 % (ref 3.0–12.0)
Neutro Abs: 6.1 10*3/uL (ref 1.4–7.7)
Neutrophils Relative %: 64.3 % (ref 43.0–77.0)
Platelets: 343 10*3/uL (ref 150.0–400.0)
RBC: 4.16 Mil/uL (ref 3.87–5.11)
RDW: 15.1 % (ref 11.5–15.5)
WBC: 9.4 10*3/uL (ref 4.0–10.5)

## 2021-11-05 LAB — LIPID PANEL
Cholesterol: 212 mg/dL — ABNORMAL HIGH (ref 0–200)
HDL: 52.8 mg/dL (ref >39.00)
NonHDL: 159.22
Total CHOL/HDL Ratio: 4
Triglycerides: 210 mg/dL — ABNORMAL HIGH (ref 0.0–149.0)
VLDL: 42 mg/dL — ABNORMAL HIGH (ref 0.0–40.0)

## 2021-11-05 LAB — MICROALBUMIN / CREATININE URINE RATIO
Creatinine,U: 57.9 mg/dL
Microalb Creat Ratio: 1.2 mg/g (ref 0.0–30.0)
Microalb, Ur: <0.7 mg/dL (ref 0.0–1.9)

## 2021-11-05 LAB — LDL CHOLESTEROL, DIRECT: Direct LDL: 132 mg/dL

## 2021-11-05 MED ORDER — OXYCODONE-ACETAMINOPHEN 5-325 MG PO TABS
1.0000 | ORAL_TABLET | Freq: Three times a day (TID) | ORAL | 0 refills | Status: DC | PRN
  Filled 2021-12-05: qty 90, 30d supply, fill #0

## 2021-11-05 MED ORDER — OXYCODONE-ACETAMINOPHEN 5-325 MG PO TABS
1.0000 | ORAL_TABLET | Freq: Three times a day (TID) | ORAL | 0 refills | Status: DC | PRN
  Filled 2021-11-05: qty 90, 30d supply, fill #0

## 2021-11-05 MED ORDER — OXYCODONE-ACETAMINOPHEN 5-325 MG PO TABS
1.0000 | ORAL_TABLET | Freq: Three times a day (TID) | ORAL | 0 refills | Status: DC | PRN
  Filled 2022-01-06: qty 90, 30d supply, fill #0

## 2021-11-05 NOTE — Patient Instructions (Signed)
Chronic Kidney Disease, Adult Chronic kidney disease (CKD) occurs when the kidneys are slowly and permanently damaged over a long period of time. The kidneys are a pair of organs that do many important jobs in the body, including: Removing waste and extra fluid from the blood to make urine. Making hormones that maintain the amount of fluid in tissues and blood vessels. Maintaining the right amount of fluids and chemicals in the body. A small amount of kidney damage may not cause problems, but a large amount of damage may make it hard or impossible for the kidneys to work right. Steps must be taken to slow kidney damage or to stop it from getting worse. If steps are not taken, the kidneys may stop working permanently (end-stage renal disease, or ESRD). Most of the time, CKD does not go away, but it can often be controlled. People who have CKD are usually able to live full lives. What are the causes? The most common causes of this condition are diabetes and high blood pressure (hypertension). Other causes include: Cardiovascular diseases. These affect the heart and blood vessels. Kidney diseases. These include: Glomerulonephritis, or inflammation of the tiny filters in the kidneys. Interstitial nephritis. This is swelling of the small tubes of the kidneys and of the surrounding structures. Polycystic kidney disease, in which clusters of fluid-filled sacs form within the kidneys. Renal vascular disease. This includes disorders that affect the arteries and veins of the kidneys. Diseases that affect the body's defense system (immune system). A problem with urine flow. This may be caused by: Kidney stones. Cancer. An enlarged prostate, in males. A kidney infection or urinary tract infection (UTI) that keeps coming back. Vasculitis. This is swelling or inflammation of the blood vessels. What increases the risk? Your chances of having kidney disease increase with age. The following factors may make  you more likely to develop this condition: A family history of kidney disease or kidney failure. Kidney failure means the kidneys can no longer work right. Certain genetic diseases. Taking medicines often that are damaging to the kidneys. Being around or being in contact with toxic substances. Obesity. A history of tobacco use. What are the signs or symptoms? Symptoms of this condition include: Feeling very tired (lethargic) and having less energy. Swelling, or edema, of the face, legs, ankles, or feet. Nausea or vomiting, or loss of appetite. Confusion or trouble concentrating. Muscle twitches and cramps, especially in the legs. Dry, itchy skin. A metallic taste in the mouth. Producing less urine, or producing more urine (especially at night). Shortness of breath. Trouble sleeping. CKD may also result in not having enough red blood cells or hemoglobin in the blood (anemia) or having weak bones (bone disease). Symptoms develop slowly and may not be obvious until the kidney damage becomes severe. It is possible to have kidney disease for years without having symptoms. How is this diagnosed? This condition may be diagnosed based on: Blood tests. Urine tests. Imaging tests, such as an ultrasound or a CT scan. A kidney biopsy. This involves removing a sample of kidney tissue to be looked at under a microscope. Results from these tests will help to determine how serious the CKD is. How is this treated? There is no cure for most cases of this condition, but treatment usually relieves symptoms and prevents or slows the worsening of the disease. Treatment may include: Diet changes, which may require you to avoid alcohol and foods that are high in salt, potassium, phosphorous, and protein. Medicines. These may:  Lower blood pressure. Control blood sugar (glucose). Relieve anemia. Relieve swelling. Protect your bones. Improve the balance of salts and minerals in your blood  (electrolytes). Dialysis, which is a type of treatment that removes toxic waste from the body. It may be needed if you have kidney failure. Managing any other conditions that are causing your CKD or making it worse. Follow these instructions at home: Medicines Take over-the-counter and prescription medicines only as told by your health care provider. The amount of some medicines that you take may need to be changed. Do not take any new medicines unless approved by your health care provider. Many medicines can make kidney damage worse. Do not take any vitamin and mineral supplements unless approved by your health care provider. Many nutritional supplements can make kidney damage worse. Lifestyle  Do not use any products that contain nicotine or tobacco, such as cigarettes, e-cigarettes, and chewing tobacco. If you need help quitting, ask your health care provider. If you drink alcohol: Limit how much you use to: 0-1 drink a day for women who are not pregnant. 0-2 drinks a day for men. Know how much alcohol is in your drink. In the [place] one drink equals one 12 oz bottle of beer (355 mL), one 5 oz glass of wine (148 mL), or one 1 oz glass of hard liquor (44 mL). Maintain a healthy weight. If you need help, ask your health care provider. General instructions  Follow instructions from your health care provider about eating or drinking restrictions, including any prescribed diet. Track your blood pressure at home. Report changes in your blood pressure as told. If you are being treated for diabetes, track your blood glucose levels as told. Start or continue an exercise plan. Exercise at least 30 minutes a day, 5 days a week. Keep your immunizations up to date as told. Keep all follow-up visits. This is important. Where to find more information American Association of Kidney Patients: BombTimer.gl National Kidney Foundation: www.kidney.[place]: https://mathis.com/ Life Options:  www.lifeoptions.org Kidney School: www.kidneyschool.org Contact a health care provider if: Your symptoms get worse. You develop new symptoms. Get help right away if: You develop symptoms of ESRD. These include: Headaches. Numbness in your hands or feet. Easy bruising. Frequent hiccups. Chest pain. Shortness of breath. Lack of menstrual periods, in women. You have a fever. You are producing less urine than usual. You have pain or bleeding when you urinate or when you have a bowel movement. These symptoms may represent a serious problem that is an emergency. Do not wait to see if the symptoms will go away. Get medical help right away. Call your local emergency services (911 in the [place] Do not drive yourself to the hospital. Summary Chronic kidney disease (CKD) occurs when the kidneys become damaged slowly over a long period of time. The most common causes of this condition are diabetes and high blood pressure (hypertension). There is no cure for most cases of CKD, but treatment usually relieves symptoms and prevents or slows the worsening of the disease. Treatment may include a combination of lifestyle changes, medicines, and dialysis. This information is not intended to replace advice given to you by your health care provider. Make sure you discuss any questions you have with your health care provider. Document Revised: 03/29/2019 Document Reviewed: 03/29/2019 Elsevier Patient Education  [place].

## 2021-11-05 NOTE — Progress Notes (Signed)
Subjective:  Patient ID: Joann Bond, female    DOB: March 13, 1965  Age: 56 y.o. MRN: [id number]  CC: Hypertension, Diabetes, and Hyperlipidemia   HPI Joann Bond presents for f/up-  She is active and denies DOE, CP, SOB, edema.  Outpatient Medications Prior to Visit  Medication Sig Dispense Refill   amLODipine (NORVASC) 5 MG tablet Take 1 tablet (5 mg total) by mouth daily. 90 tablet 1   empagliflozin (JARDIANCE) 10 MG TABS tablet Take 1 tablet (10 mg total) by mouth daily before breakfast. 90 tablet 0   gabapentin (NEURONTIN) 300 MG capsule Take 1 capsule (300 mg total) by mouth 3 (three) times daily. 270 capsule 0   hydrALAZINE (APRESOLINE) 25 MG tablet Take 1 tablet (25 mg total) by mouth 3 (three) times daily. 270 tablet 0   Magnesium 500 MG TABS Take 1 tablet (500 mg total) by mouth 2 (two) times daily. 60 tablet 1   nebivolol (BYSTOLIC) 10 MG tablet Take 1 tablet (10 mg total) by mouth daily. 90 tablet 1   ondansetron (ZOFRAN) 4 MG tablet Take 1 tablet (4 mg total) by mouth every 8 (eight) hours as needed for nausea or vomiting. 20 tablet 0   [START ON 01/04/2022] oxyCODONE-acetaminophen (PERCOCET/ROXICET) 5-325 MG tablet Take 1 tablet by mouth every 8 (eight) hours as needed for pain 90 tablet 0   oxyCODONE-acetaminophen (PERCOCET/ROXICET) 5-325 MG tablet Take 1 tablet by mouth every 8 (eight) hours as needed for pain 90 tablet 0   [START ON 12/05/2021] oxyCODONE-acetaminophen (PERCOCET/ROXICET) 5-325 MG tablet Take 1 tablet by mouth every 8 (eight) hours as needed for pain 90 tablet 0   tizanidine (ZANAFLEX) 2 MG capsule Take 1 capsule (2 mg total) by mouth 3 (three) times daily. 270 capsule 0   tizanidine (ZANAFLEX) 2 MG capsule Take 1 capsule (2 mg total) by mouth 3 (three) times daily. 270 capsule 0   valsartan (DIOVAN) 80 MG tablet Take 1 tablet (80 mg total) by mouth 2 (two) times daily. 180 tablet 1   No facility-administered medications prior to visit.    ROS Review of  Systems  Constitutional: Negative.  Negative for diaphoresis, fatigue and unexpected weight change.  HENT: Negative.    Eyes: Negative.   Respiratory:  Negative for cough, chest tightness, shortness of breath and wheezing.   Cardiovascular:  Negative for chest pain, palpitations and leg swelling.  Gastrointestinal:  Negative for abdominal pain, constipation, diarrhea, nausea and vomiting.  Endocrine: Negative.   Genitourinary: Negative.  Negative for difficulty urinating.  Musculoskeletal:  Positive for back pain. Negative for arthralgias and myalgias.  Skin: Negative.   Neurological: Negative.  Negative for dizziness, weakness and light-headedness.  Hematological:  Negative for adenopathy. Does not bruise/bleed easily.  Psychiatric/Behavioral: Negative.      Objective:  BP 132/84 (BP Location: Left Arm, Patient Position: Sitting, Cuff Size: Large)   Pulse 73   Temp 98.2 F (36.8 C) (Oral)   Resp 16   Ht '5\' 2"'$  (1.575 m)   Wt 148 lb (67.1 kg)   SpO2 95%   BMI 27.07 kg/m   BP Readings from Last 3 Encounters:  11/05/21 132/84  09/18/21 (!) 176/94  09/15/21 (!) 172/84    Wt Readings from Last 3 Encounters:  11/05/21 148 lb (67.1 kg)  09/18/21 147 lb (66.7 kg)  09/15/21 149 lb 6.4 oz (67.8 kg)    Physical Exam Vitals reviewed.  HENT:     Nose: Nose normal.     Mouth/Throat:  Mouth: Mucous membranes are moist.  Eyes:     General: No scleral icterus.    Conjunctiva/sclera: Conjunctivae normal.  Cardiovascular:     Rate and Rhythm: Normal rate and regular rhythm.     Heart sounds: No murmur heard. Pulmonary:     Effort: Pulmonary effort is normal.     Breath sounds: No stridor. No wheezing, rhonchi or rales.  Abdominal:     General: Abdomen is flat.     Palpations: There is no mass.     Tenderness: There is no abdominal tenderness. There is no guarding.     Hernia: No hernia is present.  Musculoskeletal:        General: Normal range of motion.     Cervical  back: Neck supple.     Right lower leg: No edema.     Left lower leg: No edema.  Lymphadenopathy:     Cervical: No cervical adenopathy.  Skin:    General: Skin is warm and dry.  Neurological:     General: No focal deficit present.     Mental Status: She is alert.     Lab Results  Component Value Date   WBC 9.4 11/05/2021   HGB 12.2 11/05/2021   HCT 37.8 11/05/2021   PLT 343.0 11/05/2021   GLUCOSE 119 (H) 09/17/2021   CHOL 212 (H) 11/05/2021   TRIG 210.0 (H) 11/05/2021   HDL 52.80 11/05/2021   LDLDIRECT 132.0 11/05/2021   ALT 37 (H) 09/17/2021   AST 16 09/17/2021   NA 140 09/17/2021   K 4.1 09/17/2021   CL 101 09/17/2021   CREATININE 1.05 09/17/2021   BUN 22 09/17/2021   CO2 32 09/17/2021   TSH 1.83 09/18/2021   HGBA1C 6.6 (H) 09/18/2021   MICROALBUR <0.7 11/05/2021    CT ABDOMEN PELVIS WO CONTRAST  Result Date: 09/08/2021 CLINICAL DATA:  Nausea, vomiting, renal cell carcinoma status post partial right nephrectomy EXAM: CT ABDOMEN AND PELVIS WITHOUT CONTRAST TECHNIQUE: Multidetector CT imaging of the abdomen and pelvis was performed following the standard protocol without IV contrast. RADIATION DOSE REDUCTION: This exam was performed according to the departmental dose-optimization program which includes automated exposure control, adjustment of the mA and/or kV according to patient size and/or use of iterative reconstruction technique. COMPARISON:  MRI 04/07/2021 FINDINGS: Lower chest: Mild left basilar parenchymal scarring. No acute abnormality. Hepatobiliary: No focal liver abnormality is seen. No gallstones, gallbladder wall thickening, or biliary dilatation. Pancreas: Unremarkable Spleen: Unremarkable Adrenals/Urinary Tract: The adrenal glands are unremarkable. The kidneys are normal in size and position. Interval partial right nephrectomy with resection of the right lower pole cystic renal mass noted on prior MRI examination. The kidneys are otherwise unremarkable. Bladder  unremarkable. Stomach/Bowel: Moderate sigmoid and descending colonic diverticulosis. The stomach, small bowel, and large bowel are otherwise unremarkable. Appendix normal. No free intraperitoneal gas or fluid. Vascular/Lymphatic: No significant vascular findings are present. No enlarged abdominal or pelvic lymph nodes. Reproductive: Uterus and bilateral adnexa are unremarkable. Other: No abdominal wall hernia Musculoskeletal: Extensive thoracolumbar fusion with instrumentation with bilateral sacro are screws are again noted. No acute bone abnormality. No lytic or blastic bone lesion. IMPRESSION: 1. No acute intra-abdominal pathology identified. No definite radiographic explanation for the patient's reported symptoms. 2. Interval partial right nephrectomy. No evidence of residual or recurrent disease. 3. Moderate distal colonic diverticulosis. Electronically Signed   By: Fidela Salisbury M.D.   On: 09/08/2021 21:30    Assessment & Plan:   Tomeeka was seen today  for hypertension, diabetes and hyperlipidemia.  Diagnoses and all orders for this visit:  Malignant hypertension- Her BP is adequately well controlled. -     CBC with Differential/Platelet; Future -     Urinalysis, Routine w reflex microscopic; Future -     Urinalysis, Routine w reflex microscopic -     CBC with Differential/Platelet  Type 2 diabetes mellitus with diabetic nephropathy, without long-term current use of insulin ([place])- Her blood sugar is well controlled. -     Microalbumin / creatinine urine ratio; Future -     HM Diabetes Foot Exam -     Microalbumin / creatinine urine ratio  Stage 3a chronic kidney disease ([place])- Will continue the SGLT-2 inh -     CBC with Differential/Platelet; Future -     Microalbumin / creatinine urine ratio; Future -     Urinalysis, Routine w reflex microscopic; Future -     Microalbumin / creatinine urine ratio -     Urinalysis, Routine w reflex microscopic -     CBC with  Differential/Platelet  Hyperlipidemia with target LDL less than 100- I have recommended a statin for CV risk reduction. -     Lipid panel; Future -     Lipid panel -     rosuvastatin (CRESTOR) 10 MG tablet; Take 1 tablet (10 mg total) by mouth daily.  Need for vaccination -     Pneumococcal conjugate vaccine 20-valent (Prevnar 20)  Other orders -     LDL cholesterol, direct   I am having Joann Bond start on rosuvastatin. I am also having her maintain her tizanidine, ondansetron, nebivolol, valsartan, amLODipine, Magnesium, hydrALAZINE, empagliflozin, gabapentin, tizanidine, oxyCODONE-acetaminophen, oxyCODONE-acetaminophen, and oxyCODONE-acetaminophen.  Meds ordered this encounter  Medications   rosuvastatin (CRESTOR) 10 MG tablet    Sig: Take 1 tablet (10 mg total) by mouth daily.    Dispense:  90 tablet    Refill:  1     Follow-up: No follow-ups on file.  Scarlette Calico, MD

## 2021-11-06 ENCOUNTER — Other Ambulatory Visit (HOSPITAL_COMMUNITY): Payer: Self-pay

## 2021-11-06 LAB — URINALYSIS, ROUTINE W REFLEX MICROSCOPIC
Bilirubin Urine: NEGATIVE
Hgb urine dipstick: NEGATIVE
Ketones, ur: NEGATIVE
Leukocytes,Ua: NEGATIVE
Nitrite: NEGATIVE
RBC / HPF: NONE SEEN (ref 0–?)
Specific Gravity, Urine: 1.01 (ref 1.000–1.030)
Total Protein, Urine: NEGATIVE
Urine Glucose: >=1000 — AB
Urobilinogen, UA: 0.2 (ref 0.0–1.0)
pH: 6 (ref 5.0–8.0)

## 2021-11-06 MED ORDER — ROSUVASTATIN CALCIUM 10 MG PO TABS
10.0000 mg | ORAL_TABLET | Freq: Every day | ORAL | 1 refills | Status: DC
  Filled 2021-11-06: qty 90, 90d supply, fill #0
  Filled 2022-03-09: qty 90, 90d supply, fill #1

## 2021-11-07 ENCOUNTER — Encounter: Payer: Self-pay | Admitting: Internal Medicine

## 2021-11-12 ENCOUNTER — Other Ambulatory Visit (HOSPITAL_COMMUNITY): Payer: Self-pay

## 2021-11-12 ENCOUNTER — Telehealth: Payer: Self-pay | Admitting: Pharmacy Technician

## 2021-11-12 NOTE — Telephone Encounter (Signed)
-----   Message from Jasper Loser, [place] sent at 11/11/2021  6:55 PM EST ----- Regarding: FW: Prolia  ----- Message ----- From: Janith Lima, MD Sent: 11/06/2021   8:01 AM EST To: Jasper Loser, CMA Subject: Fara Olden -  Can we set her up for prolia?  Scarlette Calico, MD

## 2021-11-12 NOTE — Telephone Encounter (Signed)
Prolia BIV submitted

## 2021-11-12 NOTE — Telephone Encounter (Signed)
Pharmacy Patient Advocate Encounter  Insurance verification completed.    The patient is insured through [employer] test claims for: Prolia '60mg'$ .  Pharmacy benefit copay: $0.00

## 2021-11-17 ENCOUNTER — Other Ambulatory Visit (HOSPITAL_COMMUNITY): Payer: Self-pay

## 2021-11-17 ENCOUNTER — Ambulatory Visit
Admission: RE | Admit: 2021-11-17 | Discharge: 2021-11-17 | Disposition: A | Discharge status: Home / Self Care | Payer: 59 | Source: Ambulatory Visit | Attending: Urology | Admitting: Urology

## 2021-11-17 DIAGNOSIS — D49511 Neoplasm of unspecified behavior of right kidney: Secondary | ICD-10-CM

## 2021-11-17 DIAGNOSIS — C649 Malignant neoplasm of unspecified kidney, except renal pelvis: Secondary | ICD-10-CM | POA: Diagnosis not present

## 2021-11-17 MED ORDER — GABAPENTIN 300 MG PO CAPS
300.0000 mg | ORAL_CAPSULE | Freq: Three times a day (TID) | ORAL | 0 refills | Status: DC
  Filled 2021-11-17: qty 270, 90d supply, fill #0

## 2021-11-17 MED ORDER — GADOPICLENOL 0.5 MMOL/ML IV SOLN
6.0000 mL | Freq: Once | INTRAVENOUS | Status: AC | PRN
  Administered 2021-11-17: 6 mL via INTRAVENOUS

## 2021-11-24 DIAGNOSIS — D49511 Neoplasm of unspecified behavior of right kidney: Secondary | ICD-10-CM | POA: Diagnosis not present

## 2021-12-01 ENCOUNTER — Ambulatory Visit
Admission: RE | Admit: 2021-12-01 | Discharge: 2021-12-01 | Disposition: A | Discharge status: Home / Self Care | Payer: 59 | Source: Ambulatory Visit | Attending: Nurse Practitioner | Admitting: Nurse Practitioner

## 2021-12-01 DIAGNOSIS — Z1231 Encounter for screening mammogram for malignant neoplasm of breast: Secondary | ICD-10-CM | POA: Diagnosis not present

## 2021-12-01 NOTE — Telephone Encounter (Signed)
Pharmacy benefit is $0.00. Send rx to [facility]

## 2021-12-01 NOTE — Telephone Encounter (Signed)
Not a pt at this location.

## 2021-12-02 ENCOUNTER — Other Ambulatory Visit (HOSPITAL_COMMUNITY): Payer: Self-pay

## 2021-12-02 MED ORDER — PREVIDENT 0.2 % MT SOLN
OROMUCOSAL | 5 refills | Status: DC
  Filled 2021-12-02: qty 473, 30d supply, fill #0

## 2021-12-03 ENCOUNTER — Encounter: Payer: Self-pay | Admitting: Internal Medicine

## 2021-12-03 ENCOUNTER — Telehealth: Payer: Self-pay | Admitting: Pharmacist

## 2021-12-03 ENCOUNTER — Ambulatory Visit: Payer: 59 | Attending: Internal Medicine | Admitting: Pharmacist

## 2021-12-03 ENCOUNTER — Other Ambulatory Visit (HOSPITAL_COMMUNITY): Payer: Self-pay

## 2021-12-03 ENCOUNTER — Other Ambulatory Visit: Payer: Self-pay | Admitting: Internal Medicine

## 2021-12-03 DIAGNOSIS — Z79899 Other long term (current) drug therapy: Secondary | ICD-10-CM

## 2021-12-03 DIAGNOSIS — M810 Age-related osteoporosis without current pathological fracture: Secondary | ICD-10-CM

## 2021-12-03 DIAGNOSIS — N1831 Chronic kidney disease, stage 3a: Secondary | ICD-10-CM

## 2021-12-03 MED ORDER — PROLIA 60 MG/ML ~~LOC~~ SOSY
60.0000 mg | PREFILLED_SYRINGE | SUBCUTANEOUS | 1 refills | Status: DC
  Filled 2021-12-03: qty 1, 180d supply, fill #0

## 2021-12-03 MED ORDER — PROLIA 60 MG/ML ~~LOC~~ SOSY
60.0000 mg | PREFILLED_SYRINGE | SUBCUTANEOUS | 1 refills | Status: DC
  Filled 2021-12-03: qty 60, fill #0

## 2021-12-03 NOTE — Progress Notes (Signed)
S:  Patient presents for review of their specialty medication therapy.  Patient is considering taking Prolia for osteoporosis. Patient is managed by Dr. Ronnald Ramp for this. Of note, pt has an upcoming dental procedure. In addition, she has a hx of CKD S/p partial nephrectomy (R) in July of this year. She would like a Nephrologist's opinion before starting Prolia.   Adherence: has not started   Efficacy: has not started  Dosing: 60 mg once every 6 months.  Dose adjustments: Renal: Monitor patients with severe impairment (CrCl <30 mL/minute or on dialysis) closely, as significant and prolonged hypocalcemia (incidence of 29% and potentially lasting weeks to months) and marked elevations of serum parathyroid hormone are serious risks in this population. Ensure adequate calcium and vitamin D intake/supplementation. CrCl ?30 mL/minute: No dosage adjustment necessary. CrCl <30 mL/minute: No dosage adjustment necessary; use in conjunction with guidance from patient's nephrology team. Hepatic: no dose adjustments (has not been studied)  Drug-drug interactions: none  Monitoring: S/sx of infection: none S/sx of hypersensitivity: none S/sx of hypocalcemia/hypercalcemia: none Dermatitis/skin rash: none Peripheral edema: none HA: none GI upset:none   Other side effects: none  Last bone density study: 05/02/2019   O:      Lab Results  Component Value Date   WBC 9.4 11/05/2021   HGB 12.2 11/05/2021   HCT 37.8 11/05/2021   MCV 90.9 11/05/2021   PLT 343.0 11/05/2021      Chemistry      Component Value Date/Time   NA 140 09/17/2021 1329   K 4.1 09/17/2021 1329   CL 101 09/17/2021 1329   CO2 32 09/17/2021 1329   BUN 22 09/17/2021 1329   CREATININE 1.05 09/17/2021 1329      Component Value Date/Time   CALCIUM 9.5 09/17/2021 1329   ALKPHOS 74 09/17/2021 1329   AST 16 09/17/2021 1329   ALT 37 (H) 09/17/2021 1329   BILITOT 0.4 09/17/2021 1329       A/P: 1. Medication review:  Patient is considering Prolia for osteoporosis. Reviewed the medication with the patient, including the following: Prolia (denosumab) is a monoclonal antibody with affinity for nuclear factor-kappa ligand (RANKL). Prolia binds to RANKL and prevents osteoclast formation, leading to decreased bone resorption and increased bone mass in osteoporosis. Patient educated on purpose, proper use, and potential adverse effects of Prolia. The most common adverse effects are hypersensitivities, peripheral edema, dermatitis/skin rash, GI upset, HA, joint pain, and infection. There is the possibility of atypical femur fracture, serum calcium disturbances, and osteonecrosis of the jaw. Patients should monitor for and report hip, thigh, or groin pain. Additionally, patients should monitor for and report jaw pain, tooth/periodontal infection, toothache, and/or gingival ulceration/erosion. Prolia exists as a solution prefilled syringe for SQ administration. Administration: Denosumab is intended for SubQ route only and should not be administered IV, IM, or intradermally. Prior to administration, bring to room temperature in original container (allow to stand ~15 to 30 minutes); do not warm by any other method. Solution may contain trace amounts of translucent to white protein particles; do not use if cloudy, discolored (normal solution should be clear and colorless to pale yellow), or contains excessive particles or foreign matter. Avoid vigorous shaking. Administer via SubQ injection in the upper arm, upper thigh, or abdomen; should only be administered by a health care professional. Recommend for her to wait until after dental work to start Prolia. In addition, I recommended she speak with a Nephrologist before starting therapy.   Benard Halsted, PharmD, BCACP,  [place] [phone]

## 2021-12-03 NOTE — Telephone Encounter (Signed)
Called patient to schedule an appointment for the [facility]. I was unable to reach the patient so I left a HIPAA-compliant message requesting that the patient return my call.   Luke Van Ausdall, PharmD, BCACP, CPP Clinical Pharmacist [facility] [phone]  

## 2021-12-04 ENCOUNTER — Other Ambulatory Visit (HOSPITAL_COMMUNITY): Payer: Self-pay

## 2021-12-05 ENCOUNTER — Other Ambulatory Visit (HOSPITAL_COMMUNITY): Payer: Self-pay

## 2021-12-10 ENCOUNTER — Encounter: Payer: Self-pay | Admitting: Internal Medicine

## 2021-12-10 ENCOUNTER — Other Ambulatory Visit (HOSPITAL_COMMUNITY): Payer: Self-pay

## 2021-12-10 ENCOUNTER — Other Ambulatory Visit: Payer: Self-pay | Admitting: Internal Medicine

## 2021-12-10 DIAGNOSIS — I10 Essential (primary) hypertension: Secondary | ICD-10-CM

## 2021-12-10 MED ORDER — HYDRALAZINE HCL 25 MG PO TABS
25.0000 mg | ORAL_TABLET | Freq: Three times a day (TID) | ORAL | 0 refills | Status: DC
  Filled 2021-12-10: qty 270, 90d supply, fill #0

## 2021-12-12 ENCOUNTER — Other Ambulatory Visit (HOSPITAL_COMMUNITY): Payer: Self-pay

## 2021-12-20 DIAGNOSIS — Z760 Encounter for issue of repeat prescription: Secondary | ICD-10-CM | POA: Diagnosis not present

## 2021-12-23 ENCOUNTER — Other Ambulatory Visit: Payer: Self-pay

## 2021-12-23 ENCOUNTER — Other Ambulatory Visit (HOSPITAL_COMMUNITY): Payer: Self-pay

## 2021-12-31 ENCOUNTER — Other Ambulatory Visit (HOSPITAL_COMMUNITY): Payer: Self-pay

## 2022-01-06 ENCOUNTER — Other Ambulatory Visit (HOSPITAL_COMMUNITY): Payer: Self-pay

## 2022-01-15 ENCOUNTER — Other Ambulatory Visit: Payer: Self-pay

## 2022-01-21 ENCOUNTER — Other Ambulatory Visit: Payer: Self-pay | Admitting: Internal Medicine

## 2022-01-21 ENCOUNTER — Other Ambulatory Visit (HOSPITAL_COMMUNITY): Payer: Self-pay

## 2022-01-21 DIAGNOSIS — N1831 Chronic kidney disease, stage 3a: Secondary | ICD-10-CM

## 2022-01-21 DIAGNOSIS — E1121 Type 2 diabetes mellitus with diabetic nephropathy: Secondary | ICD-10-CM

## 2022-01-21 MED ORDER — EMPAGLIFLOZIN 10 MG PO TABS
10.0000 mg | ORAL_TABLET | Freq: Every day | ORAL | 0 refills | Status: DC
  Filled 2022-01-21: qty 90, 90d supply, fill #0

## 2022-01-23 ENCOUNTER — Other Ambulatory Visit (HOSPITAL_COMMUNITY): Payer: Self-pay

## 2022-01-23 DIAGNOSIS — I129 Hypertensive chronic kidney disease with stage 1 through stage 4 chronic kidney disease, or unspecified chronic kidney disease: Secondary | ICD-10-CM | POA: Diagnosis not present

## 2022-01-23 DIAGNOSIS — N1831 Chronic kidney disease, stage 3a: Secondary | ICD-10-CM | POA: Diagnosis not present

## 2022-01-23 MED ORDER — AMLODIPINE BESYLATE 10 MG PO TABS
10.0000 mg | ORAL_TABLET | Freq: Every day | ORAL | 3 refills | Status: DC
  Filled 2022-01-23: qty 90, 90d supply, fill #0
  Filled 2022-04-29: qty 90, 90d supply, fill #1
  Filled 2022-07-29: qty 90, 90d supply, fill #2
  Filled 2022-10-26: qty 90, 90d supply, fill #3

## 2022-02-04 ENCOUNTER — Other Ambulatory Visit (HOSPITAL_COMMUNITY): Payer: Self-pay

## 2022-02-04 DIAGNOSIS — M5136 Other intervertebral disc degeneration, lumbar region: Secondary | ICD-10-CM | POA: Diagnosis not present

## 2022-02-04 DIAGNOSIS — G894 Chronic pain syndrome: Secondary | ICD-10-CM | POA: Diagnosis not present

## 2022-02-04 DIAGNOSIS — Z79891 Long term (current) use of opiate analgesic: Secondary | ICD-10-CM | POA: Diagnosis not present

## 2022-02-04 DIAGNOSIS — M47816 Spondylosis without myelopathy or radiculopathy, lumbar region: Secondary | ICD-10-CM | POA: Diagnosis not present

## 2022-02-04 MED ORDER — OXYCODONE-ACETAMINOPHEN 5-325 MG PO TABS
1.0000 | ORAL_TABLET | Freq: Three times a day (TID) | ORAL | 0 refills | Status: DC | PRN
  Filled 2022-03-09: qty 90, 30d supply, fill #0

## 2022-02-04 MED ORDER — OXYCODONE-ACETAMINOPHEN 5-325 MG PO TABS
1.0000 | ORAL_TABLET | Freq: Three times a day (TID) | ORAL | 0 refills | Status: DC | PRN
  Filled 2022-02-06: qty 90, 30d supply, fill #0

## 2022-02-04 MED ORDER — OXYCODONE-ACETAMINOPHEN 5-325 MG PO TABS
1.0000 | ORAL_TABLET | Freq: Three times a day (TID) | ORAL | 0 refills | Status: DC | PRN
  Filled 2022-04-09: qty 90, 30d supply, fill #0

## 2022-02-06 ENCOUNTER — Other Ambulatory Visit (HOSPITAL_COMMUNITY): Payer: Self-pay

## 2022-02-07 ENCOUNTER — Ambulatory Visit
Admission: EM | Admit: 2022-02-07 | Discharge: 2022-02-07 | Disposition: A | Discharge status: Home / Self Care | Payer: Commercial Managed Care - PPO | Attending: Physician Assistant | Admitting: Physician Assistant

## 2022-02-07 DIAGNOSIS — S61011A Laceration without foreign body of right thumb without damage to nail, initial encounter: Secondary | ICD-10-CM | POA: Diagnosis not present

## 2022-02-07 NOTE — ED Provider Notes (Signed)
[facility]    CSN: [id number] Arrival date & time: 02/07/22  7001      History   Chief Complaint Chief Complaint  Patient presents with   Laceration    HPI Joann Bond is a 57 y.o. female.   Patient presents today for evaluation of right thumb laceration that occurred about 6 hours ago with a pair of scissors. She reports she did have significant bleeding initially but was able to control this before visit today. She denies any numbness. Last tetanus immunization given was 2020.   The history is provided by the patient.  Laceration Associated symptoms: no fever     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, neg margins   Hypertension    Pneumonia    many years ago    Patient Active Problem List   Diagnosis Date Noted   Hyperlipidemia with target LDL less than 100 11/05/2021   Malignant hypertension 09/18/2021   Type 2 diabetes mellitus with diabetic nephropathy, without long-term current use of insulin ([place]) 09/18/2021   Vitamin D deficiency 09/15/2021   Hypomagnesemia 09/15/2021   Age-related osteoporosis with current pathological fracture 01/24/2020   Encounter for general adult medical examination with abnormal findings 01/24/2020   Stage 3a chronic kidney disease ([place]) 01/24/2020   Spinal stenosis 08/16/2019    Past Surgical History:  Procedure Laterality Date   EYE SURGERY     ROBOTIC ASSITED PARTIAL NEPHRECTOMY Right 07/25/2021   Procedure: XI ROBOTIC ASSITED  LAPAROSCOPIC PARTIAL NEPHRECTOMY;  Surgeon: Janith Lima, MD;  Location: [facility];  Service: Urology;  Laterality: Right;  ONLY NEEDS 240 MIN   SPINAL FUSION     Scoliosis/ age 78   SPINAL FUSION     spinal stenosis age 36   TONSILLECTOMY      OB History     Gravida  0   Para  0   Term  0   Preterm  0   AB  0   Living  0       SAB  0   IAB  0   Ectopic  0   Multiple  0   Live Births  0            Home Medications    Prior to Admission medications   Medication Sig Start Date End Date Taking? Authorizing Provider  amLODipine (NORVASC) 10 MG tablet 1 tab by mouth daily 01/23/22     amLODipine (NORVASC) 5 MG tablet Take 1 tablet (5 mg total) by mouth daily. 09/15/21   Plotnikov, Evie Lacks, MD  empagliflozin (JARDIANCE) 10 MG TABS tablet Take 1 tablet (10 mg total) by mouth daily before breakfast. 01/21/22   Janith Lima, MD  gabapentin (NEURONTIN) 300 MG capsule Take 1 capsule (300 mg total) by mouth 3 (three) times daily. 10/09/21     gabapentin (NEURONTIN) 300 MG capsule Take 1 capsule (300 mg total) by mouth 3 (three) times daily. 11/17/21     hydrALAZINE (APRESOLINE) 25 MG tablet Take 1 tablet (25 mg total) by mouth 3 (three) times daily. 12/10/21   Janith Lima, MD  Magnesium 500 MG TABS Take 1 tablet (500 mg total) by mouth 2 (two) times daily. 09/15/21   Plotnikov, Evie Lacks, MD  nebivolol (BYSTOLIC) 10 MG tablet Take 1  tablet (10 mg total) by mouth daily. 09/15/21   Plotnikov, Evie Lacks, MD  ondansetron (ZOFRAN) 4 MG tablet Take 1 tablet (4 mg total) by mouth every 8 (eight) hours as needed for nausea or vomiting. 09/10/21   Georgette Shell, MD  oxyCODONE-acetaminophen (PERCOCET/ROXICET) 5-325 MG tablet Take 1 tablet by mouth every 8 (eight) hours as needed for pain 01/04/22     oxyCODONE-acetaminophen (PERCOCET/ROXICET) 5-325 MG tablet Take 1 tablet by mouth every 8 (eight) hours as needed for pain 11/05/21     oxyCODONE-acetaminophen (PERCOCET/ROXICET) 5-325 MG tablet Take 1 tablet by mouth every 8 (eight) hours as needed for pain 12/05/21     oxyCODONE-acetaminophen (PERCOCET/ROXICET) 5-325 MG tablet Take 1 tablet by mouth every 8 (eight) hours as needed for pain. 04/06/22 04/06/22     oxyCODONE-acetaminophen (PERCOCET/ROXICET) 5-325 MG tablet Take 1 tablet by mouth every 8 (eight) hours as needed for  pain. 02/05/22     oxyCODONE-acetaminophen (PERCOCET/ROXICET) 5-325 MG tablet Take 1 tablet by mouth every 8 (eight) hours as needed for pain. 03/07/22 03/07/22     PROLIA 60 MG/ML SOSY injection Inject 60 mg into the skin every 6 (six) months. 12/03/21   Tresa Garter, MD  rosuvastatin (CRESTOR) 10 MG tablet Take 1 tablet (10 mg total) by mouth daily. 11/06/21   Janith Lima, MD  SODIUM FLUORIDE, DENTAL RINSE, (PREVIDENT) 0.2 % SOLN USE AS AN ORAL RINSE, AS DIRECTED ON PACKAGE 12/02/21     tizanidine (ZANAFLEX) 2 MG capsule Take 1 capsule (2 mg total) by mouth 3 (three) times daily. 12/12/20     tizanidine (ZANAFLEX) 2 MG capsule Take 1 capsule (2 mg total) by mouth 3 (three) times daily. 10/09/21     valsartan (DIOVAN) 80 MG tablet Take 1 tablet (80 mg total) by mouth 2 (two) times daily. 09/15/21   Plotnikov, Evie Lacks, MD    Family History Family History  Problem Relation Age of Onset   Cancer Mother    Hypertension Mother    Breast cancer Mother     Social History Social History   Tobacco Use   Smoking status: Never   Smokeless tobacco: Never  Vaping Use   Vaping Use: Never used  Substance Use Topics   Alcohol use: Never   Drug use: Never     Allergies   Bactrim [sulfamethoxazole-trimethoprim] and Cefdinir   Review of Systems Review of Systems  Constitutional:  Negative for chills and fever.  Eyes:  Negative for discharge and redness.  Respiratory:  Negative for shortness of breath.   Skin:  Positive for wound. Negative for color change.  Neurological:  Negative for numbness.     Physical Exam Triage Vital Signs ED Triage Vitals  Enc Vitals Group     BP 02/07/22 0926 117/77     Pulse Rate 02/07/22 0926 66     Resp 02/07/22 0926 17     Temp 02/07/22 0926 98.7 F (37.1 C)     Temp Source 02/07/22 0926 Oral     SpO2 02/07/22 0926 93 %     Weight --      Height --      Head Circumference --      Peak Flow --      Pain Score 02/07/22 0925 4     Pain Loc  --      Pain Edu? --      Excl. in [place]? --    No data found.  Updated Vital Signs BP  117/77 (BP Location: Left Arm)   Pulse 66   Temp 98.7 F (37.1 C) (Oral)   Resp 17   SpO2 93%      Physical Exam Vitals and nursing note reviewed.  Constitutional:      General: She is not in acute distress.    Appearance: Normal appearance. She is not ill-appearing.  HENT:     Head: Normocephalic and atraumatic.  Eyes:     Conjunctiva/sclera: Conjunctivae normal.  Cardiovascular:     Rate and Rhythm: Normal rate.  Pulmonary:     Effort: Pulmonary effort is normal. No respiratory distress.  Skin:    Comments: Approx 3 cm laceration to distal right thumb pad, no active bleeding  Neurological:     Mental Status: She is alert.     Comments: Gross sensation intact to right thumb  Psychiatric:        Mood and Affect: Mood normal.        Behavior: Behavior normal.      UC Treatments / Results  Labs (all labs ordered are listed, but only abnormal results are displayed) Labs Reviewed - No data to display  EKG   Radiology No results found.  Procedures Laceration Repair  Date/Time: 02/07/2022 10:10 AM  Performed by: Francene Finders, PA-C Authorized by: Francene Finders, PA-C   Consent:    Consent obtained:  Verbal   Consent given by:  Patient   Risks, benefits, and alternatives were discussed: yes     Risks discussed:  Infection and pain   Alternatives discussed:  No treatment Universal protocol:    Procedure explained and questions answered to patient or proxy's satisfaction: yes     Required blood products, implants, devices, and special equipment available: yes     Patient identity confirmed:  Provided demographic data Anesthesia:    Anesthesia method:  None Laceration details:    Location:  Finger   Finger location:  R thumb   Length (cm):  3   Depth (mm):  1 Treatment:    Area cleansed with:  Soap and water and chlorhexidine   Amount of cleaning:  Standard    Irrigation solution:  Tap water   Irrigation method:  Tap   Visualized foreign bodies/material removed: no   Skin repair:    Repair method:  Tissue adhesive Approximation:    Approximation:  Close Repair type:    Repair type:  Simple Post-procedure details:    Dressing:  Open (no dressing)   Procedure completion:  Tolerated well, no immediate complications  (including critical care time)  Medications Ordered in UC Medications - No data to display  Initial Impression / Assessment and Plan / UC Course  I have reviewed the triage vital signs and the nursing notes.  Pertinent labs & imaging results that were available during my care of the patient were reviewed by me and considered in my medical decision making (see chart for details).    Discussed that dermabond does not need removal but should come off in about a week without assistance- advised to avoid scrubbing hands but to keep hands clean and dry. Recommend follow up should she develop any signs of infection or any further concerns.   Final Clinical Impressions(s) / UC Diagnoses   Final diagnoses:  Laceration of right thumb without foreign body without damage to nail, initial encounter   Discharge Instructions   None    ED Prescriptions   None    PDMP not reviewed this encounter.  Francene Finders, PA-C 02/07/22 1013

## 2022-02-07 NOTE — ED Triage Notes (Signed)
Pt presents with laceration to right thumb from scissors that she was trying to use this morning.

## 2022-02-09 ENCOUNTER — Ambulatory Visit
Admission: EM | Admit: 2022-02-09 | Discharge: 2022-02-09 | Disposition: A | Discharge status: Home / Self Care | Payer: Commercial Managed Care - PPO | Attending: Internal Medicine | Admitting: Internal Medicine

## 2022-02-09 ENCOUNTER — Encounter: Payer: Self-pay | Admitting: Emergency Medicine

## 2022-02-09 DIAGNOSIS — S61011D Laceration without foreign body of right thumb without damage to nail, subsequent encounter: Secondary | ICD-10-CM

## 2022-02-09 NOTE — ED Provider Notes (Signed)
[facility]    CSN: [id number] Arrival date & time: 02/09/22  1010      History   Chief Complaint Chief Complaint  Patient presents with   Laceration    HPI Joann Bond is a 57 y.o. female.   Patient presents for further evaluation of laceration to right thumb that occurred approximately 3 days ago.  Patient was originally seen at urgent care when laceration first occurred.  She states that she cut her finger with scissors.  Dermabond was applied at urgent care but patient reports that it started coming off that same day.  She is concerned given that it is open.  Has kept it covered with Band-Aids.  Denies numbness or tingling.  Has full range of motion of thumb.   Laceration   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, neg margins   Hypertension    Pneumonia    many years ago    Patient Active Problem List   Diagnosis Date Noted   Hyperlipidemia with target LDL less than 100 11/05/2021   Malignant hypertension 09/18/2021   Type 2 diabetes mellitus with diabetic nephropathy, without long-term current use of insulin ([place]) 09/18/2021   Vitamin D deficiency 09/15/2021   Hypomagnesemia 09/15/2021   Age-related osteoporosis with current pathological fracture 01/24/2020   Encounter for general adult medical examination with abnormal findings 01/24/2020   Stage 3a chronic kidney disease ([place]) 01/24/2020   Spinal stenosis 08/16/2019    Past Surgical History:  Procedure Laterality Date   EYE SURGERY     ROBOTIC ASSITED PARTIAL NEPHRECTOMY Right 07/25/2021   Procedure: XI ROBOTIC ASSITED  LAPAROSCOPIC PARTIAL NEPHRECTOMY;  Surgeon: Janith Lima, MD;  Location: [facility];  Service: Urology;  Laterality: Right;  ONLY NEEDS 240 MIN   SPINAL FUSION     Scoliosis/ age 86   SPINAL FUSION     spinal stenosis age 21    TONSILLECTOMY      OB History     Gravida  0   Para  0   Term  0   Preterm  0   AB  0   Living  0      SAB  0   IAB  0   Ectopic  0   Multiple  0   Live Births  0            Home Medications    Prior to Admission medications   Medication Sig Start Date End Date Taking? Authorizing Provider  amLODipine (NORVASC) 10 MG tablet 1 tab by mouth daily 01/23/22  Yes   amLODipine (NORVASC) 5 MG tablet Take 1 tablet (5 mg total) by mouth daily. 09/15/21  Yes Plotnikov, Evie Lacks, MD  empagliflozin (JARDIANCE) 10 MG TABS tablet Take 1 tablet (10 mg total) by mouth daily before breakfast. 01/21/22  Yes Janith Lima, MD  gabapentin (NEURONTIN) 300 MG capsule Take 1 capsule (300 mg total) by mouth 3 (three) times daily. 10/09/21  Yes   gabapentin (NEURONTIN) 300 MG capsule Take 1 capsule (300 mg total) by mouth 3 (three) times daily. 11/17/21  Yes   hydrALAZINE (APRESOLINE) 25 MG tablet Take 1 tablet (25 mg total) by mouth 3 (three) times daily. 12/10/21  Yes Janith Lima, MD  Magnesium 500 MG TABS Take 1 tablet (  500 mg total) by mouth 2 (two) times daily. 09/15/21  Yes Plotnikov, Evie Lacks, MD  nebivolol (BYSTOLIC) 10 MG tablet Take 1 tablet (10 mg total) by mouth daily. 09/15/21  Yes Plotnikov, Evie Lacks, MD  ondansetron (ZOFRAN) 4 MG tablet Take 1 tablet (4 mg total) by mouth every 8 (eight) hours as needed for nausea or vomiting. 09/10/21  Yes Georgette Shell, MD  oxyCODONE-acetaminophen (PERCOCET/ROXICET) 5-325 MG tablet Take 1 tablet by mouth every 8 (eight) hours as needed for pain 01/04/22  Yes   oxyCODONE-acetaminophen (PERCOCET/ROXICET) 5-325 MG tablet Take 1 tablet by mouth every 8 (eight) hours as needed for pain 11/05/21  Yes   oxyCODONE-acetaminophen (PERCOCET/ROXICET) 5-325 MG tablet Take 1 tablet by mouth every 8 (eight) hours as needed for pain 12/05/21  Yes   oxyCODONE-acetaminophen (PERCOCET/ROXICET) 5-325 MG tablet Take 1 tablet by mouth every 8 (eight) hours  as needed for pain. 04/06/22 04/06/22  Yes   oxyCODONE-acetaminophen (PERCOCET/ROXICET) 5-325 MG tablet Take 1 tablet by mouth every 8 (eight) hours as needed for pain. 02/05/22  Yes   oxyCODONE-acetaminophen (PERCOCET/ROXICET) 5-325 MG tablet Take 1 tablet by mouth every 8 (eight) hours as needed for pain. 03/07/22 03/07/22  Yes   PROLIA 60 MG/ML SOSY injection Inject 60 mg into the skin every 6 (six) months. 12/03/21  Yes Tresa Garter, MD  rosuvastatin (CRESTOR) 10 MG tablet Take 1 tablet (10 mg total) by mouth daily. 11/06/21  Yes Janith Lima, MD  SODIUM FLUORIDE, DENTAL RINSE, (PREVIDENT) 0.2 % SOLN USE AS AN ORAL RINSE, AS DIRECTED ON PACKAGE 12/02/21  Yes   tizanidine (ZANAFLEX) 2 MG capsule Take 1 capsule (2 mg total) by mouth 3 (three) times daily. 12/12/20  Yes   tizanidine (ZANAFLEX) 2 MG capsule Take 1 capsule (2 mg total) by mouth 3 (three) times daily. 10/09/21  Yes   valsartan (DIOVAN) 80 MG tablet Take 1 tablet (80 mg total) by mouth 2 (two) times daily. 09/15/21  Yes Plotnikov, Evie Lacks, MD    Family History Family History  Problem Relation Age of Onset   Cancer Mother    Hypertension Mother    Breast cancer Mother     Social History Social History   Tobacco Use   Smoking status: Never   Smokeless tobacco: Never  Vaping Use   Vaping Use: Never used  Substance Use Topics   Alcohol use: Never   Drug use: Never     Allergies   Bactrim [sulfamethoxazole-trimethoprim] and Cefdinir   Review of Systems Review of Systems Per HPI  Physical Exam Triage Vital Signs ED Triage Vitals  Enc Vitals Group     BP 02/09/22 1026 99/65     Pulse Rate 02/09/22 1026 63     Resp 02/09/22 1026 18     Temp 02/09/22 1026 97.7 F (36.5 C)     Temp Source 02/09/22 1026 Oral     SpO2 02/09/22 1026 97 %     Weight 02/09/22 1028 143 lb (64.9 kg)     Height 02/09/22 1028 '5\' 2"'$  (1.575 m)     Head Circumference --      Peak Flow --      Pain Score 02/09/22 1027 3     Pain Loc --       Pain Edu? --      Excl. in [place]? --    No data found.  Updated Vital Signs BP 99/65 (BP Location: Left Arm)   Pulse 63  Temp 97.7 F (36.5 C) (Oral)   Resp 18   Ht '5\' 2"'$  (1.575 m)   Wt 143 lb (64.9 kg)   SpO2 97%   BMI 26.16 kg/m   Visual Acuity Right Eye Distance:   Left Eye Distance:   Bilateral Distance:    Right Eye Near:   Left Eye Near:    Bilateral Near:     Physical Exam Constitutional:      General: She is not in acute distress.    Appearance: Normal appearance. She is not toxic-appearing or diaphoretic.  HENT:     Head: Normocephalic and atraumatic.  Eyes:     Extraocular Movements: Extraocular movements intact.     Conjunctiva/sclera: Conjunctivae normal.  Pulmonary:     Effort: Pulmonary effort is normal.  Skin:    Comments: Approximately 1 inch linear laceration that is very superficial present to pad of right thumb.  No bleeding noted.  Patient has full range of motion of thumb.  Capillary refill and pulses are intact.  Neurological:     General: No focal deficit present.     Mental Status: She is alert and oriented to person, place, and time. Mental status is at baseline.  Psychiatric:        Mood and Affect: Mood normal.        Behavior: Behavior normal.        Thought Content: Thought content normal.        Judgment: Judgment normal.      UC Treatments / Results  Labs (all labs ordered are listed, but only abnormal results are displayed) Labs Reviewed - No data to display  EKG   Radiology No results found.  Procedures Procedures (including critical care time)  Medications Ordered in UC Medications - No data to display  Initial Impression / Assessment and Plan / UC Course  I have reviewed the triage vital signs and the nursing notes.  Pertinent labs & imaging results that were available during my care of the patient were reviewed by me and considered in my medical decision making (see chart for details).     No additional  closure needed to laceration given duration of time since it occurred and given the laceration is very superficial.  It should continue to heal on its own.  Discussed this with the patient.  Discussed with patient monitoring for signs of infection and keeping it covered.  Advised daily dressing changes with antibiotic ointment and washing with antibacterial soap.  Patient advised to monitor very closely for any adverse effects and to follow-up if they occur.  Dressing applied prior to discharge by clinical staff.  Patient verbalized understanding and was agreeable with plan. Final Clinical Impressions(s) / UC Diagnoses   Final diagnoses:  Laceration of right thumb without foreign body without damage to nail, subsequent encounter     Discharge Instructions      Please keep covered and apply antibiotic ointment.  You may wash it daily with Dial soap.  Change dressing daily.  Follow-up if you notice any increased redness, swelling, pus.    ED Prescriptions   None    PDMP not reviewed this encounter.   Teodora Medici, [place] 02/09/22 1125

## 2022-02-09 NOTE — ED Triage Notes (Signed)
Patient was seen on Saturday for a laceration on right thumb, the area was closed with Dermabond.  The glue has come completely undone and the area has opened back up.

## 2022-02-09 NOTE — Discharge Instructions (Signed)
Please keep covered and apply antibiotic ointment.  You may wash it daily with Dial soap.  Change dressing daily.  Follow-up if you notice any increased redness, swelling, pus.

## 2022-02-19 ENCOUNTER — Other Ambulatory Visit (HOSPITAL_COMMUNITY): Payer: Self-pay

## 2022-02-19 MED ORDER — GABAPENTIN 300 MG PO CAPS
300.0000 mg | ORAL_CAPSULE | Freq: Three times a day (TID) | ORAL | 0 refills | Status: DC
  Filled 2022-02-19: qty 270, 90d supply, fill #0

## 2022-03-09 ENCOUNTER — Other Ambulatory Visit (HOSPITAL_COMMUNITY): Payer: Self-pay

## 2022-03-11 ENCOUNTER — Encounter: Payer: Self-pay | Admitting: Internal Medicine

## 2022-03-11 ENCOUNTER — Other Ambulatory Visit (HOSPITAL_COMMUNITY): Payer: Self-pay

## 2022-03-11 DIAGNOSIS — I10 Essential (primary) hypertension: Secondary | ICD-10-CM

## 2022-03-11 MED ORDER — HYDRALAZINE HCL 25 MG PO TABS
25.0000 mg | ORAL_TABLET | Freq: Three times a day (TID) | ORAL | 0 refills | Status: DC
  Filled 2022-03-11: qty 270, 90d supply, fill #0

## 2022-03-11 MED ORDER — NEBIVOLOL HCL 10 MG PO TABS
10.0000 mg | ORAL_TABLET | Freq: Every day | ORAL | 0 refills | Status: DC
  Filled 2022-03-11: qty 90, 90d supply, fill #0

## 2022-04-06 ENCOUNTER — Other Ambulatory Visit (HOSPITAL_COMMUNITY): Payer: Self-pay

## 2022-04-06 MED ORDER — OXYCODONE-ACETAMINOPHEN 5-325 MG PO TABS
1.0000 | ORAL_TABLET | Freq: Three times a day (TID) | ORAL | 0 refills | Status: DC | PRN
  Filled 2022-05-11: qty 90, 30d supply, fill #0

## 2022-04-06 MED ORDER — OXYCODONE-ACETAMINOPHEN 5-325 MG PO TABS: 1.0000 | ORAL_TABLET | Freq: Three times a day (TID) | ORAL | 0 refills | Status: DC | PRN

## 2022-04-06 MED ORDER — OXYCODONE-ACETAMINOPHEN 5-325 MG PO TABS
1.0000 | ORAL_TABLET | Freq: Three times a day (TID) | ORAL | 0 refills | Status: DC | PRN
  Filled 2022-06-11: qty 90, 30d supply, fill #0

## 2022-04-09 ENCOUNTER — Other Ambulatory Visit (HOSPITAL_COMMUNITY): Payer: Self-pay

## 2022-04-15 DIAGNOSIS — N1831 Chronic kidney disease, stage 3a: Secondary | ICD-10-CM | POA: Diagnosis not present

## 2022-04-15 DIAGNOSIS — I129 Hypertensive chronic kidney disease with stage 1 through stage 4 chronic kidney disease, or unspecified chronic kidney disease: Secondary | ICD-10-CM | POA: Diagnosis not present

## 2022-04-15 LAB — LAB REPORT - SCANNED: EGFR: 62

## 2022-04-17 ENCOUNTER — Other Ambulatory Visit: Payer: Self-pay | Admitting: Internal Medicine

## 2022-04-17 ENCOUNTER — Other Ambulatory Visit (HOSPITAL_COMMUNITY): Payer: Self-pay

## 2022-04-17 MED ORDER — VALSARTAN 80 MG PO TABS
80.0000 mg | ORAL_TABLET | Freq: Two times a day (BID) | ORAL | 1 refills | Status: DC
  Filled 2022-04-17: qty 180, 90d supply, fill #0
  Filled 2022-07-20: qty 180, 90d supply, fill #1

## 2022-04-17 MED ORDER — NEBIVOLOL HCL 10 MG PO TABS
10.0000 mg | ORAL_TABLET | Freq: Every day | ORAL | 0 refills | Status: DC
  Filled 2022-04-17 – 2022-06-29 (×2): qty 90, 90d supply, fill #0

## 2022-05-10 ENCOUNTER — Other Ambulatory Visit (HOSPITAL_COMMUNITY): Payer: Self-pay

## 2022-05-11 ENCOUNTER — Other Ambulatory Visit (HOSPITAL_COMMUNITY): Payer: Self-pay

## 2022-05-11 MED ORDER — TIZANIDINE HCL 2 MG PO CAPS
2.0000 mg | ORAL_CAPSULE | Freq: Three times a day (TID) | ORAL | 0 refills | Status: DC
  Filled 2022-05-11: qty 270, 90d supply, fill #0

## 2022-05-18 ENCOUNTER — Other Ambulatory Visit (HOSPITAL_COMMUNITY): Payer: Self-pay

## 2022-05-18 MED ORDER — GABAPENTIN 300 MG PO CAPS
300.0000 mg | ORAL_CAPSULE | Freq: Three times a day (TID) | ORAL | 0 refills | Status: DC
  Filled 2022-05-18: qty 270, 90d supply, fill #0

## 2022-05-26 ENCOUNTER — Other Ambulatory Visit (HOSPITAL_COMMUNITY): Payer: Self-pay

## 2022-05-26 ENCOUNTER — Other Ambulatory Visit: Payer: Self-pay | Admitting: Internal Medicine

## 2022-05-26 DIAGNOSIS — E785 Hyperlipidemia, unspecified: Secondary | ICD-10-CM

## 2022-05-26 DIAGNOSIS — N1831 Chronic kidney disease, stage 3a: Secondary | ICD-10-CM

## 2022-05-26 DIAGNOSIS — E1121 Type 2 diabetes mellitus with diabetic nephropathy: Secondary | ICD-10-CM

## 2022-05-26 MED ORDER — EMPAGLIFLOZIN 10 MG PO TABS
10.0000 mg | ORAL_TABLET | Freq: Every day | ORAL | 0 refills | Status: DC
  Filled 2022-05-26: qty 90, 90d supply, fill #0

## 2022-05-26 MED ORDER — ROSUVASTATIN CALCIUM 10 MG PO TABS
10.0000 mg | ORAL_TABLET | Freq: Every day | ORAL | 0 refills | Status: DC
  Filled 2022-05-26: qty 90, 90d supply, fill #0

## 2022-05-30 ENCOUNTER — Emergency Department (HOSPITAL_COMMUNITY): Payer: PRIVATE HEALTH INSURANCE

## 2022-05-30 ENCOUNTER — Emergency Department (HOSPITAL_COMMUNITY)
Admission: EM | Admit: 2022-05-30 | Discharge: 2022-05-30 | Disposition: A | Discharge status: Home / Self Care | Payer: PRIVATE HEALTH INSURANCE | Attending: Emergency Medicine | Admitting: Emergency Medicine

## 2022-05-30 DIAGNOSIS — M25561 Pain in right knee: Secondary | ICD-10-CM | POA: Insufficient documentation

## 2022-05-30 DIAGNOSIS — W19XXXA Unspecified fall, initial encounter: Secondary | ICD-10-CM | POA: Diagnosis not present

## 2022-05-30 MED ORDER — IBUPROFEN 400 MG PO TABS
600.0000 mg | ORAL_TABLET | Freq: Once | ORAL | Status: AC
  Administered 2022-05-30: 600 mg via ORAL
  Filled 2022-05-30: qty 1

## 2022-05-30 NOTE — ED Provider Notes (Signed)
[place] EMERGENCY DEPARTMENT AT [facility] Provider Note   CSN: [id number] Arrival date & time: 05/30/22  1126     History  Chief Complaint  Patient presents with   Fall   Knee Pain    Joann Bond is a 57 y.o. female.   Fall  Knee Pain Patient presents after a fall.  Fall was mechanical in nature.  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  She had a mechanical fall forward.  When she fell, she struck her right knee.  This occurred 2 hours prior to arrival.  She has had persistent pain in right knee which is worse with weightbearing.  She denies any prior surgery to right knee.  Patient denies any other areas of discomfort since her fall.     Home Medications Prior to Admission medications   Medication Sig Start Date End Date Taking? Authorizing Provider  amLODipine (NORVASC) 10 MG tablet 1 tab by mouth daily 01/23/22     amLODipine (NORVASC) 5 MG tablet Take 1 tablet (5 mg total) by mouth daily. 09/15/21   Plotnikov, Georgina Quint, MD  empagliflozin (JARDIANCE) 10 MG TABS tablet Take 1 tablet (10 mg total) by mouth daily before breakfast. 05/26/22   Etta Grandchild, MD  gabapentin (NEURONTIN) 300 MG capsule Take 1 capsule (300 mg total) by mouth 3 (three) times daily. 10/09/21     gabapentin (NEURONTIN) 300 MG capsule Take 1 capsule (300 mg total) by mouth 3 (three) times daily. 11/17/21     gabapentin (NEURONTIN) 300 MG capsule Take 1 capsule (300 mg total) by mouth 3 (three) times daily. 02/19/22     gabapentin (NEURONTIN) 300 MG capsule Take 1 capsule (300 mg total) by mouth 3 (three) times daily. 05/18/22     hydrALAZINE (APRESOLINE) 25 MG tablet Take 1 tablet (25 mg total) by mouth 3 (three) times daily. Follow-up due in June must see provider for future refills 03/11/22   Etta Grandchild, MD  Magnesium 500 MG TABS Take 1 tablet (500 mg total) by mouth 2 (two) times daily. 09/15/21   Plotnikov, Georgina Quint, MD  nebivolol  (BYSTOLIC) 10 MG tablet Take 1 tablet (10 mg total) by mouth daily. Follow-up due in June must see provider for future refills 04/17/22   Etta Grandchild, MD  ondansetron (ZOFRAN) 4 MG tablet Take 1 tablet (4 mg total) by mouth every 8 (eight) hours as needed for nausea or vomiting. 09/10/21   Alwyn Ren, MD  oxyCODONE-acetaminophen (PERCOCET/ROXICET) 5-325 MG tablet Take 1 tablet by mouth every 8 (eight) hours as needed for pain 01/04/22     oxyCODONE-acetaminophen (PERCOCET/ROXICET) 5-325 MG tablet Take 1 tablet by mouth every 8 (eight) hours as needed for pain 11/05/21     oxyCODONE-acetaminophen (PERCOCET/ROXICET) 5-325 MG tablet Take 1 tablet by mouth every 8 (eight) hours as needed for pain 12/05/21     oxyCODONE-acetaminophen (PERCOCET/ROXICET) 5-325 MG tablet Take 1 tablet by mouth every 8 (eight) hours as needed for pain. Max daily amout is 3 tablets. 04/06/22     oxyCODONE-acetaminophen (PERCOCET/ROXICET) 5-325 MG tablet Take 1 tablet by mouth every 8 (eight) hours as needed for pain. 02/05/22     oxyCODONE-acetaminophen (PERCOCET/ROXICET) 5-325 MG tablet Take 1 tablet by mouth every 8 (eight) hours as needed for pain. 03/07/22     oxyCODONE-acetaminophen (PERCOCET/ROXICET) 5-325 MG tablet Take one tablet by mouth every 8 (eight) hours as needed for Pain  for up to 30 days. Max Daily Amount: 3 tablets 06/07/22     oxyCODONE-acetaminophen (PERCOCET/ROXICET) 5-325 MG tablet Take one tablet by mouth every 8 (eight) hours as needed for Pain for up to 30 days. Max Daily Amount: 3 tablets 05/08/22     oxyCODONE-acetaminophen (PERCOCET/ROXICET) 5-325 MG tablet Take 1 tablet by mouth every 8 (eight) hours as needed for pain. Max daily amount 3 tablet. 04/08/22     PROLIA 60 MG/ML SOSY injection Inject 60 mg into the skin every 6 (six) months. 12/03/21   Quentin Angst, MD  rosuvastatin (CRESTOR) 10 MG tablet Take 1 tablet (10 mg total) by mouth daily. 05/26/22   Etta Grandchild, MD  SODIUM FLUORIDE,  DENTAL RINSE, (PREVIDENT) 0.2 % SOLN USE AS AN ORAL RINSE, AS DIRECTED ON PACKAGE 12/02/21     tizanidine (ZANAFLEX) 2 MG capsule Take 1 capsule (2 mg total) by mouth 3 (three) times daily. 12/12/20     tizanidine (ZANAFLEX) 2 MG capsule Take 1 capsule (2 mg total) by mouth 3 (three) times daily. 05/11/22     valsartan (DIOVAN) 80 MG tablet Take 1 tablet (80 mg total) by mouth 2 (two) times daily. 04/17/22   Etta Grandchild, MD      Allergies    Bactrim [sulfamethoxazole-trimethoprim] and Cefdinir    Review of Systems   Review of Systems  Musculoskeletal:  Positive for arthralgias.  All other systems reviewed and are negative.   Physical Exam Updated Vital Signs BP (!) 149/79 (BP Location: Right Arm)   Pulse 83   Temp 98.2 F (36.8 C) (Oral)   Resp 14   SpO2 98%  Physical Exam Vitals and nursing note reviewed.  Constitutional:      General: She is not in acute distress.    Appearance: Normal appearance. She is well-developed. She is not ill-appearing, toxic-appearing or diaphoretic.  HENT:     Head: Normocephalic and atraumatic.     Right Ear: External ear normal.     Left Ear: External ear normal.     Nose: Nose normal.     Mouth/Throat:     Mouth: Mucous membranes are moist.  Eyes:     Extraocular Movements: Extraocular movements intact.     Conjunctiva/sclera: Conjunctivae normal.  Cardiovascular:     Rate and Rhythm: Normal rate and regular rhythm.  Pulmonary:     Effort: Pulmonary effort is normal. No respiratory distress.  Abdominal:     General: There is no distension.     Palpations: Abdomen is soft.  Musculoskeletal:        General: Swelling and tenderness present.     Cervical back: Normal range of motion and neck supple.     Right lower leg: No edema.     Left lower leg: No edema.  Skin:    General: Skin is warm and dry.     Coloration: Skin is not jaundiced or pale.  Neurological:     General: No focal deficit present.     Mental Status: She is alert and  oriented to person, place, and time.     Cranial Nerves: No cranial nerve deficit.     Sensory: No sensory deficit.     Motor: No weakness.     Coordination: Coordination normal.  Psychiatric:        Mood and Affect: Mood normal.        Behavior: Behavior normal.        Thought Content: Thought content normal.  Judgment: Judgment normal.     ED Results / Procedures / Treatments   Labs (all labs ordered are listed, but only abnormal results are displayed) Labs Reviewed - No data to display  EKG None  Radiology DG Knee Complete 4 Views Right  Result Date: 05/30/2022 CLINICAL DATA:  Status post fall with bruising and swelling. EXAM: RIGHT KNEE - COMPLETE 4+ VIEW COMPARISON:  None Available. FINDINGS: No joint effusion identified. No acute fracture or dislocation. No significant arthropathy identified. Soft tissue edema is noted anterior to the patella and patella tendon. IMPRESSION: 1. No acute bone abnormality. 2. Anterior soft tissue edema. Electronically Signed   By: Signa Kell M.D.   On: 05/30/2022 12:27    Procedures Procedures    Medications Ordered in ED Medications  ibuprofen (ADVIL) tablet 600 mg (600 mg Oral Given 05/30/22 1412)    ED Course/ Medical Decision Making/ A&P                             Medical Decision Making Amount and/or Complexity of Data Reviewed Radiology: ordered.   Patient presents after mechanical fall during which she struck her right knee.  She has had assistant pain since fall, which occurred 2 hours ago.  She has not had any other areas of pain.  On exam, she is overall well-appearing.  Right knee, has swelling and tenderness present on the anterior aspect.  Flexion and extension of knee are preserved, however, range of motion is limited by pain.  Patient declines any pain medication.  X-ray imaging shows no osseous or joint abnormalities.  Patient was agreeable to Motrin for analgesia.  Knee brace was provided.  She was advised to  follow-up with orthopedic surgery.  She was discharged in stable condition.        Final Clinical Impression(s) / ED Diagnoses Final diagnoses:  Fall, initial encounter  Acute pain of right knee    Rx / DC Orders ED Discharge Orders     None         Gloris Manchester, MD 05/30/22 [phone]

## 2022-05-30 NOTE — ED Triage Notes (Signed)
Pt was at work and tripped, injuring R knee. Pt denies other injuries.

## 2022-05-30 NOTE — Discharge Instructions (Signed)
Take ibuprofen and/or Tylenol as needed for pain.  Put weight on your right leg only within the tolerance of your pain.  Elevate knee when possible.  Call the telephone number below to set up a follow-up appointment with orthopedic surgery.

## 2022-06-02 ENCOUNTER — Telehealth: Payer: Self-pay

## 2022-06-02 NOTE — Transitions of Care (Post Inpatient/ED Visit) (Unsigned)
   06/02/2022  Name: Joretta Schalow MRN: [id number] DOB: 1965-08-24  Today's TOC FU Call Status: Today's TOC FU Call Status:: Unsuccessul Call (1st Attempt) Unsuccessful Call (1st Attempt) Date: 06/02/22  Attempted to reach the patient regarding the most recent Inpatient/ED visit.  Follow Up Plan: Additional outreach attempts will be made to reach the patient to complete the Transitions of Care (Post Inpatient/ED visit) call.   Signature Karena Addison, LPN [facility] Nurse Health Advisor Direct Dial [phone]

## 2022-06-03 NOTE — Transitions of Care (Post Inpatient/ED Visit) (Unsigned)
   06/03/2022  Name: Joann Bond MRN: [id number] DOB: March 29, 1965  Today's TOC FU Call Status: Today's TOC FU Call Status:: Unsuccessful Call (2nd Attempt) Unsuccessful Call (1st Attempt) Date: 06/02/22 Unsuccessful Call (2nd Attempt) Date: 06/03/22  Attempted to reach the patient regarding the most recent Inpatient/ED visit.  Follow Up Plan: Additional outreach attempts will be made to reach the patient to complete the Transitions of Care (Post Inpatient/ED visit) call.   Signature Karena Addison, LPN [facility] Nurse Health Advisor Direct Dial [phone]

## 2022-06-04 NOTE — Transitions of Care (Post Inpatient/ED Visit) (Signed)
   06/04/2022  Name: Joann Bond MRN: [id number] DOB: 07/11/1965  Today's TOC FU Call Status: Today's TOC FU Call Status:: Unsuccessful Call (3rd Attempt) Unsuccessful Call (1st Attempt) Date: 06/02/22 Unsuccessful Call (2nd Attempt) Date: 06/03/22 Unsuccessful Call (3rd Attempt) Date: 06/04/22  Attempted to reach the patient regarding the most recent Inpatient/ED visit.  Follow Up Plan: No further outreach attempts will be made at this time. We have been unable to contact the patient.  Signature Karena Addison, LPN [facility] Nurse Health Advisor Direct Dial [phone]

## 2022-06-09 ENCOUNTER — Ambulatory Visit: Payer: Self-pay

## 2022-06-09 ENCOUNTER — Other Ambulatory Visit: Payer: Self-pay | Admitting: Nurse Practitioner

## 2022-06-09 DIAGNOSIS — M25571 Pain in right ankle and joints of right foot: Secondary | ICD-10-CM

## 2022-06-11 ENCOUNTER — Other Ambulatory Visit: Payer: Self-pay

## 2022-06-11 ENCOUNTER — Other Ambulatory Visit (HOSPITAL_COMMUNITY): Payer: Self-pay

## 2022-06-15 ENCOUNTER — Ambulatory Visit: Payer: Commercial Managed Care - PPO | Admitting: Internal Medicine

## 2022-06-15 ENCOUNTER — Encounter: Payer: Self-pay | Admitting: Internal Medicine

## 2022-06-15 VITALS — BP 128/86 | HR 70 | Temp 98.0°F | Resp 16 | Ht 62.0 in | Wt 143.0 lb

## 2022-06-15 DIAGNOSIS — M8000XD Age-related osteoporosis with current pathological fracture, unspecified site, subsequent encounter for fracture with routine healing: Secondary | ICD-10-CM

## 2022-06-15 DIAGNOSIS — E785 Hyperlipidemia, unspecified: Secondary | ICD-10-CM | POA: Diagnosis not present

## 2022-06-15 DIAGNOSIS — Z0001 Encounter for general adult medical examination with abnormal findings: Secondary | ICD-10-CM

## 2022-06-15 DIAGNOSIS — Z0000 Encounter for general adult medical examination without abnormal findings: Secondary | ICD-10-CM | POA: Diagnosis not present

## 2022-06-15 DIAGNOSIS — E1121 Type 2 diabetes mellitus with diabetic nephropathy: Secondary | ICD-10-CM | POA: Diagnosis not present

## 2022-06-15 DIAGNOSIS — I10 Essential (primary) hypertension: Secondary | ICD-10-CM | POA: Diagnosis not present

## 2022-06-15 DIAGNOSIS — N1831 Chronic kidney disease, stage 3a: Secondary | ICD-10-CM | POA: Diagnosis not present

## 2022-06-15 DIAGNOSIS — M810 Age-related osteoporosis without current pathological fracture: Secondary | ICD-10-CM

## 2022-06-15 DIAGNOSIS — E559 Vitamin D deficiency, unspecified: Secondary | ICD-10-CM

## 2022-06-15 LAB — BASIC METABOLIC PANEL
BUN: 24 mg/dL — ABNORMAL HIGH (ref 6–23)
CO2: 27 mEq/L (ref 19–32)
Calcium: 9.8 mg/dL (ref 8.4–10.5)
Chloride: 104 mEq/L (ref 96–112)
Creatinine, Ser: 1.12 mg/dL (ref 0.40–1.20)
GFR: 54.64 mL/min — ABNORMAL LOW (ref >60.00)
Glucose, Bld: 132 mg/dL — ABNORMAL HIGH (ref 70–99)
Potassium: 4.4 mEq/L (ref 3.5–5.1)
Sodium: 141 mEq/L (ref 135–145)

## 2022-06-15 LAB — HEMOGLOBIN A1C: Hgb A1c MFr Bld: 6.3 % (ref 4.6–6.5)

## 2022-06-15 LAB — VITAMIN D 25 HYDROXY (VIT D DEFICIENCY, FRACTURES): VITD: 56.4 ng/mL (ref 30.00–100.00)

## 2022-06-15 NOTE — Progress Notes (Signed)
Subjective:  Patient ID: Joann Bond, female    DOB: 04/02/1965  Age: 57 y.o. MRN: [id number]  CC: Annual Exam and Diabetes   HPI Joann Bond presents for a CPX and f/up ---  She recently injured her leg and has been taking motrin for the pain. She is active and denies DOE, CP, edema, diaphoresis.  Outpatient Medications Prior to Visit  Medication Sig Dispense Refill   amLODipine (NORVASC) 10 MG tablet 1 tab by mouth daily 90 tablet 3   empagliflozin (JARDIANCE) 10 MG TABS tablet Take 1 tablet (10 mg total) by mouth daily before breakfast. 90 tablet 0   gabapentin (NEURONTIN) 300 MG capsule Take 1 capsule (300 mg total) by mouth 3 (three) times daily. 270 capsule 0   hydrALAZINE (APRESOLINE) 25 MG tablet Take 1 tablet (25 mg total) by mouth 3 (three) times daily. Follow-up due in June must see provider for future refills 270 tablet 0   Magnesium 500 MG TABS Take 1 tablet (500 mg total) by mouth 2 (two) times daily. 60 tablet 1   nebivolol (BYSTOLIC) 10 MG tablet Take 1 tablet (10 mg total) by mouth daily. Follow-up due in June must see provider for future refills 90 tablet 0   oxyCODONE-acetaminophen (PERCOCET/ROXICET) 5-325 MG tablet Take one tablet by mouth every 8 (eight) hours as needed for Pain for up to 30 days. Max Daily Amount: 3 tablets 90 tablet 0   rosuvastatin (CRESTOR) 10 MG tablet Take 1 tablet (10 mg total) by mouth daily. 90 tablet 0   SODIUM FLUORIDE, DENTAL RINSE, (PREVIDENT) 0.2 % SOLN USE AS AN ORAL RINSE, AS DIRECTED ON PACKAGE 473 mL 5   tizanidine (ZANAFLEX) 2 MG capsule Take 1 capsule (2 mg total) by mouth 3 (three) times daily. 270 capsule 0   valsartan (DIOVAN) 80 MG tablet Take 1 tablet (80 mg total) by mouth 2 (two) times daily. 180 tablet 1   amLODipine (NORVASC) 5 MG tablet Take 1 tablet (5 mg total) by mouth daily. 90 tablet 1   gabapentin (NEURONTIN) 300 MG capsule Take 1 capsule (300 mg total) by mouth 3 (three) times daily. 270 capsule 0   gabapentin  (NEURONTIN) 300 MG capsule Take 1 capsule (300 mg total) by mouth 3 (three) times daily. 270 capsule 0   gabapentin (NEURONTIN) 300 MG capsule Take 1 capsule (300 mg total) by mouth 3 (three) times daily. 270 capsule 0   ondansetron (ZOFRAN) 4 MG tablet Take 1 tablet (4 mg total) by mouth every 8 (eight) hours as needed for nausea or vomiting. 20 tablet 0   oxyCODONE-acetaminophen (PERCOCET/ROXICET) 5-325 MG tablet Take 1 tablet by mouth every 8 (eight) hours as needed for pain 90 tablet 0   oxyCODONE-acetaminophen (PERCOCET/ROXICET) 5-325 MG tablet Take 1 tablet by mouth every 8 (eight) hours as needed for pain 90 tablet 0   oxyCODONE-acetaminophen (PERCOCET/ROXICET) 5-325 MG tablet Take 1 tablet by mouth every 8 (eight) hours as needed for pain 90 tablet 0   oxyCODONE-acetaminophen (PERCOCET/ROXICET) 5-325 MG tablet Take 1 tablet by mouth every 8 (eight) hours as needed for pain. Max daily amout is 3 tablets. 90 tablet 0   oxyCODONE-acetaminophen (PERCOCET/ROXICET) 5-325 MG tablet Take 1 tablet by mouth every 8 (eight) hours as needed for pain. 90 tablet 0   oxyCODONE-acetaminophen (PERCOCET/ROXICET) 5-325 MG tablet Take 1 tablet by mouth every 8 (eight) hours as needed for pain. 90 tablet 0   oxyCODONE-acetaminophen (PERCOCET/ROXICET) 5-325 MG tablet Take one tablet by mouth  every 8 (eight) hours as needed for Pain for up to 30 days. Max Daily Amount: 3 tablets 90 tablet 0   oxyCODONE-acetaminophen (PERCOCET/ROXICET) 5-325 MG tablet Take 1 tablet by mouth every 8 (eight) hours as needed for pain. Max daily amount 3 tablet. 90 tablet 0   PROLIA 60 MG/ML SOSY injection Inject 60 mg into the skin every 6 (six) months. 1 mL 1   tizanidine (ZANAFLEX) 2 MG capsule Take 1 capsule (2 mg total) by mouth 3 (three) times daily. 270 capsule 0   No facility-administered medications prior to visit.    ROS Review of Systems  Constitutional: Negative.  Negative for diaphoresis, fatigue and unexpected weight  change.  HENT: Negative.    Eyes: Negative.   Respiratory: Negative.  Negative for cough, chest tightness, shortness of breath and wheezing.   Cardiovascular:  Negative for chest pain, palpitations and leg swelling.  Gastrointestinal:  Negative for abdominal pain, constipation and nausea.  Endocrine: Negative.   Genitourinary: Negative.  Negative for difficulty urinating.  Musculoskeletal:  Positive for arthralgias and back pain.  Skin: Negative.  Negative for color change and pallor.  Neurological:  Negative for dizziness and weakness.  Hematological:  Negative for adenopathy. Does not bruise/bleed easily.  Psychiatric/Behavioral: Negative.      Objective:  BP 128/86 (BP Location: Right Arm, Patient Position: Sitting, Cuff Size: Large)   Pulse 70   Temp 98 F (36.7 C) (Oral)   Resp 16   Ht 5\' 2"  (1.575 m)   Wt 143 lb (64.9 kg)   SpO2 95%   BMI 26.16 kg/m   BP Readings from Last 3 Encounters:  06/15/22 128/86  05/30/22 (!) 149/79  02/09/22 99/65    Wt Readings from Last 3 Encounters:  06/15/22 143 lb (64.9 kg)  02/09/22 143 lb (64.9 kg)  11/05/21 148 lb (67.1 kg)    Physical Exam Vitals reviewed.  Constitutional:      Appearance: Normal appearance.  HENT:     Nose: Nose normal.     Mouth/Throat:     Mouth: Mucous membranes are moist.  Eyes:     General: No scleral icterus.    Conjunctiva/sclera: Conjunctivae normal.  Cardiovascular:     Rate and Rhythm: Normal rate and regular rhythm.     Pulses: Normal pulses.     Heart sounds: No murmur heard.    No friction rub. No gallop.  Pulmonary:     Effort: Pulmonary effort is normal.     Breath sounds: No stridor. No wheezing, rhonchi or rales.  Abdominal:     General: Abdomen is flat.     Palpations: There is no mass.     Tenderness: There is no abdominal tenderness. There is no guarding.     Hernia: No hernia is present.  Musculoskeletal:        General: Normal range of motion.     Cervical back: Neck  supple.     Right lower leg: No edema.     Left lower leg: No edema.  Lymphadenopathy:     Cervical: No cervical adenopathy.  Skin:    General: Skin is warm and dry.  Neurological:     General: No focal deficit present.     Mental Status: She is alert. Mental status is at baseline.  Psychiatric:        Mood and Affect: Mood normal.        Behavior: Behavior normal.     Lab Results  Component Value Date  WBC 9.4 11/05/2021   HGB 12.2 11/05/2021   HCT 37.8 11/05/2021   PLT 343.0 11/05/2021   GLUCOSE 132 (H) 06/15/2022   CHOL 212 (H) 11/05/2021   TRIG 210.0 (H) 11/05/2021   HDL 52.80 11/05/2021   LDLDIRECT 132.0 11/05/2021   ALT 37 (H) 09/17/2021   AST 16 09/17/2021   NA 141 06/15/2022   K 4.4 06/15/2022   CL 104 06/15/2022   CREATININE 1.12 06/15/2022   BUN 24 (H) 06/15/2022   CO2 27 06/15/2022   TSH 1.83 09/18/2021   HGBA1C 6.3 06/15/2022   MICROALBUR <0.7 11/05/2021    DG Knee Complete 4 Views Right  Result Date: 05/30/2022 CLINICAL DATA:  Status post fall with bruising and swelling. EXAM: RIGHT KNEE - COMPLETE 4+ VIEW COMPARISON:  None Available. FINDINGS: No joint effusion identified. No acute fracture or dislocation. No significant arthropathy identified. Soft tissue edema is noted anterior to the patella and patella tendon. IMPRESSION: 1. No acute bone abnormality. 2. Anterior soft tissue edema. Electronically Signed   By: Signa Kell M.D.   On: 05/30/2022 12:27   DG Ankle Complete Right  Result Date: 06/09/2022 CLINICAL DATA:  Tripped coming off elevator and fell twisting right ankle. Persistent anterior right ankle pain. EXAM: RIGHT ANKLE - COMPLETE 3+ VIEW COMPARISON:  Right foot radiographs 08/14/2020 FINDINGS: Mild posterior and tiny plantar calcaneal heel spurs are unchanged from prior. On these nonweightbearing views the patient is angling her foot and hindfoot varus orientation. The ankle mortise is symmetric and intact. No acute fracture or dislocation.  IMPRESSION: 1. No acute fracture. 2. Mild posterior and tiny plantar calcaneal heel spurs. Electronically Signed   By: Neita Garnet M.D.   On: 06/09/2022 11:54   DG Knee Complete 4 Views Right  Result Date: 05/30/2022 CLINICAL DATA:  Status post fall with bruising and swelling. EXAM: RIGHT KNEE - COMPLETE 4+ VIEW COMPARISON:  None Available. FINDINGS: No joint effusion identified. No acute fracture or dislocation. No significant arthropathy identified. Soft tissue edema is noted anterior to the patella and patella tendon. IMPRESSION: 1. No acute bone abnormality. 2. Anterior soft tissue edema. Electronically Signed   By: Signa Kell M.D.   On: 05/30/2022 12:27     Assessment & Plan:   Age-related osteoporosis with current pathological fracture with routine healing, subsequent encounter - I have asked her to start prolia. -     VITAMIN D 25 Hydroxy (Vit-D Deficiency, Fractures); Future  Malignant hypertension- Her BP is well controlled. -     Basic metabolic panel; Future  Stage 3a chronic kidney disease (HCC)- Renal function is stable. I have asked her to stop taking nsaids. -     Basic metabolic panel; Future  Type 2 diabetes mellitus with diabetic nephropathy, without long-term current use of insulin (HCC)- Her blood sugar is well controlled. -     Hemoglobin A1c; Future -     Basic metabolic panel; Future  Hyperlipidemia with target LDL less than 100- LDL goal achieved. Doing well on the statin   Vitamin D deficiency  Osteoporosis, unspecified osteoporosis type, unspecified pathological fracture presence -     DG Bone Density; Future  Encounter for general adult medical examination with abnormal findings - Exam completed, labs reviewed, vaccines are up-to-date, cancer screenings are up-to-date, patient education was given.     Follow-up: Return in about 6 months (around 12/15/2022).  Sanda Linger, MD

## 2022-06-15 NOTE — Patient Instructions (Signed)

## 2022-06-18 DIAGNOSIS — M810 Age-related osteoporosis without current pathological fracture: Secondary | ICD-10-CM | POA: Insufficient documentation

## 2022-06-18 MED ORDER — PROLIA 60 MG/ML ~~LOC~~ SOSY
60.0000 mg | PREFILLED_SYRINGE | SUBCUTANEOUS | 1 refills | Status: DC
  Filled 2022-06-18: qty 1, 180d supply, fill #0

## 2022-06-19 ENCOUNTER — Other Ambulatory Visit (HOSPITAL_COMMUNITY): Payer: Self-pay

## 2022-06-19 ENCOUNTER — Other Ambulatory Visit: Payer: Self-pay | Admitting: Pharmacist

## 2022-06-19 ENCOUNTER — Other Ambulatory Visit: Payer: Self-pay

## 2022-06-19 ENCOUNTER — Other Ambulatory Visit: Payer: Self-pay | Admitting: Urology

## 2022-06-19 DIAGNOSIS — D49511 Neoplasm of unspecified behavior of right kidney: Secondary | ICD-10-CM

## 2022-06-19 DIAGNOSIS — M810 Age-related osteoporosis without current pathological fracture: Secondary | ICD-10-CM

## 2022-06-19 MED ORDER — PROLIA 60 MG/ML ~~LOC~~ SOSY
60.0000 mg | PREFILLED_SYRINGE | SUBCUTANEOUS | 1 refills | Status: AC
  Filled 2022-06-19 – 2022-07-14 (×3): qty 1, 180d supply, fill #0
  Filled 2022-12-25: qty 1, 180d supply, fill #1

## 2022-06-24 ENCOUNTER — Other Ambulatory Visit (HOSPITAL_COMMUNITY): Payer: Self-pay | Admitting: Orthopedic Surgery

## 2022-06-24 ENCOUNTER — Telehealth: Payer: Self-pay

## 2022-06-24 ENCOUNTER — Other Ambulatory Visit (HOSPITAL_COMMUNITY): Payer: Self-pay

## 2022-06-24 DIAGNOSIS — M25561 Pain in right knee: Secondary | ICD-10-CM

## 2022-06-24 NOTE — Telephone Encounter (Addendum)
Pharmacy Patient Advocate Encounter   Received notification from [employer] that prior authorization for Prolia is required/requested.   PA submitted to [facility] via CoverMyMeds Key  # [id number] Status is pending

## 2022-06-24 NOTE — Telephone Encounter (Signed)
Prolia VOB initiated via [email or web address] 

## 2022-06-25 ENCOUNTER — Ambulatory Visit (HOSPITAL_COMMUNITY)
Admission: RE | Admit: 2022-06-25 | Discharge: 2022-06-25 | Disposition: A | Discharge status: Home / Self Care | Payer: PRIVATE HEALTH INSURANCE | Source: Ambulatory Visit | Attending: Orthopedic Surgery | Admitting: Orthopedic Surgery

## 2022-06-25 DIAGNOSIS — M25561 Pain in right knee: Secondary | ICD-10-CM | POA: Insufficient documentation

## 2022-06-29 ENCOUNTER — Other Ambulatory Visit (HOSPITAL_COMMUNITY): Payer: Self-pay

## 2022-06-30 ENCOUNTER — Encounter (HOSPITAL_COMMUNITY): Payer: Self-pay

## 2022-06-30 ENCOUNTER — Other Ambulatory Visit (HOSPITAL_COMMUNITY): Payer: Self-pay

## 2022-06-30 NOTE — Telephone Encounter (Signed)
The request has been approved. The authorization is effective for a maximum of 2 fills from 06/24/2022 to 06/23/2023, as long as the member is enrolled in their current health plan.

## 2022-07-02 ENCOUNTER — Other Ambulatory Visit (HOSPITAL_COMMUNITY): Payer: Self-pay

## 2022-07-03 ENCOUNTER — Other Ambulatory Visit (HOSPITAL_COMMUNITY): Payer: Self-pay

## 2022-07-03 NOTE — Telephone Encounter (Addendum)
Pt ready for scheduling for PROLIA on or after : 07/03/22  Out-of-pocket cost due at time of visit: $120  Primary: [employer] Prolia co-insurance: $60 Admin fee co-insurance: $60  Secondary: --- Prolia co-insurance:  Admin fee co-insurance:   Medical Benefit Details: Date Benefits were checked: 06/24/22 Deductible: NO/ Coinsurance: $60/ Admin Fee: $60  Prior Auth: APPROVED PA# [id number] Expiration Date: 06/24/22-06/23/23   Pharmacy benefit: Copay $750 (MANDATORY ENROLLMENT IN COPAY ASSISTANCE PROGRAM) If patient wants fill through the pharmacy benefit please send prescription to: [employer], and include estimated need by date in rx notes. Pharmacy will ship medication directly to the office.  Patient is eligible for Prolia Copay Card. Copay Card can make patient's cost as little as $25. Link to apply: [email or web address]://www.[email or web address]  ** This summary of benefits is an estimation of the patient's out-of-pocket cost. Exact cost may very based on individual plan coverage.

## 2022-07-06 ENCOUNTER — Other Ambulatory Visit (HOSPITAL_COMMUNITY): Payer: Self-pay

## 2022-07-06 DIAGNOSIS — M47816 Spondylosis without myelopathy or radiculopathy, lumbar region: Secondary | ICD-10-CM | POA: Diagnosis not present

## 2022-07-06 DIAGNOSIS — Z79891 Long term (current) use of opiate analgesic: Secondary | ICD-10-CM | POA: Diagnosis not present

## 2022-07-06 DIAGNOSIS — M25561 Pain in right knee: Secondary | ICD-10-CM | POA: Insufficient documentation

## 2022-07-06 DIAGNOSIS — G894 Chronic pain syndrome: Secondary | ICD-10-CM | POA: Insufficient documentation

## 2022-07-06 MED ORDER — OXYCODONE-ACETAMINOPHEN 5-325 MG PO TABS
1.0000 | ORAL_TABLET | Freq: Three times a day (TID) | ORAL | 0 refills | Status: DC | PRN
  Filled 2022-12-16: qty 90, 30d supply, fill #0

## 2022-07-06 MED ORDER — OXYCODONE-ACETAMINOPHEN 5-325 MG PO TABS
ORAL_TABLET | ORAL | 0 refills | Status: DC
  Filled 2022-07-13: qty 90, 30d supply, fill #0

## 2022-07-06 MED ORDER — OXYCODONE-ACETAMINOPHEN 5-325 MG PO TABS
1.0000 | ORAL_TABLET | Freq: Three times a day (TID) | ORAL | 0 refills | Status: DC | PRN
  Filled 2022-08-14: qty 90, 30d supply, fill #0

## 2022-07-07 ENCOUNTER — Other Ambulatory Visit (HOSPITAL_COMMUNITY): Payer: Self-pay

## 2022-07-10 ENCOUNTER — Other Ambulatory Visit: Payer: Self-pay

## 2022-07-13 ENCOUNTER — Other Ambulatory Visit (HOSPITAL_COMMUNITY): Payer: Self-pay

## 2022-07-13 ENCOUNTER — Other Ambulatory Visit: Payer: Self-pay

## 2022-07-14 ENCOUNTER — Other Ambulatory Visit: Payer: Self-pay

## 2022-07-14 ENCOUNTER — Other Ambulatory Visit (HOSPITAL_COMMUNITY): Payer: Self-pay

## 2022-07-17 ENCOUNTER — Ambulatory Visit (INDEPENDENT_AMBULATORY_CARE_PROVIDER_SITE_OTHER): Payer: Commercial Managed Care - PPO

## 2022-07-17 DIAGNOSIS — M810 Age-related osteoporosis without current pathological fracture: Secondary | ICD-10-CM | POA: Diagnosis not present

## 2022-07-17 MED ORDER — DENOSUMAB 60 MG/ML ~~LOC~~ SOSY
60.0000 mg | PREFILLED_SYRINGE | Freq: Once | SUBCUTANEOUS | Status: AC
Start: 2022-07-17 — End: 2022-07-17
  Administered 2022-07-17: 60 mg via SUBCUTANEOUS

## 2022-07-17 NOTE — Progress Notes (Signed)
Pt received her prolia injection today and responded well to it. Patient received it in her left arm.

## 2022-08-12 ENCOUNTER — Other Ambulatory Visit (HOSPITAL_COMMUNITY): Payer: Self-pay

## 2022-08-12 ENCOUNTER — Other Ambulatory Visit: Payer: Self-pay

## 2022-08-12 ENCOUNTER — Encounter (HOSPITAL_COMMUNITY): Payer: Self-pay

## 2022-08-12 MED ORDER — TIZANIDINE HCL 2 MG PO CAPS
2.0000 mg | ORAL_CAPSULE | Freq: Three times a day (TID) | ORAL | 0 refills | Status: DC
  Filled 2022-08-12 (×2): qty 270, 90d supply, fill #0

## 2022-08-12 MED ORDER — GABAPENTIN 300 MG PO CAPS
300.0000 mg | ORAL_CAPSULE | Freq: Three times a day (TID) | ORAL | 0 refills | Status: DC
  Filled 2022-08-12: qty 270, 90d supply, fill #0

## 2022-08-13 ENCOUNTER — Encounter: Payer: Self-pay | Admitting: Urology

## 2022-08-13 ENCOUNTER — Other Ambulatory Visit (HOSPITAL_COMMUNITY): Payer: Self-pay

## 2022-08-14 ENCOUNTER — Ambulatory Visit
Admission: RE | Admit: 2022-08-14 | Discharge: 2022-08-14 | Disposition: A | Discharge status: Home / Self Care | Payer: Commercial Managed Care - PPO | Source: Ambulatory Visit | Attending: Urology | Admitting: Urology

## 2022-08-14 ENCOUNTER — Other Ambulatory Visit (HOSPITAL_COMMUNITY): Payer: Self-pay

## 2022-08-14 DIAGNOSIS — D49511 Neoplasm of unspecified behavior of right kidney: Secondary | ICD-10-CM

## 2022-08-14 DIAGNOSIS — Z905 Acquired absence of kidney: Secondary | ICD-10-CM | POA: Diagnosis not present

## 2022-08-14 MED ORDER — GADOPICLENOL 0.5 MMOL/ML IV SOLN
6.0000 mL | Freq: Once | INTRAVENOUS | Status: AC | PRN
  Administered 2022-08-14: 6 mL via INTRAVENOUS

## 2022-08-19 DIAGNOSIS — D49511 Neoplasm of unspecified behavior of right kidney: Secondary | ICD-10-CM | POA: Diagnosis not present

## 2022-08-26 ENCOUNTER — Other Ambulatory Visit (HOSPITAL_COMMUNITY): Payer: Self-pay | Admitting: Urology

## 2022-08-26 ENCOUNTER — Ambulatory Visit
Admission: RE | Admit: 2022-08-26 | Discharge: 2022-08-26 | Disposition: A | Discharge status: Home / Self Care | Payer: Commercial Managed Care - PPO | Source: Ambulatory Visit | Attending: Internal Medicine | Admitting: Internal Medicine

## 2022-08-26 ENCOUNTER — Ambulatory Visit (HOSPITAL_COMMUNITY)
Admission: RE | Admit: 2022-08-26 | Discharge: 2022-08-26 | Disposition: A | Discharge status: Home / Self Care | Payer: Commercial Managed Care - PPO | Source: Ambulatory Visit | Attending: Urology | Admitting: Urology

## 2022-08-26 DIAGNOSIS — I82220 Acute embolism and thrombosis of inferior vena cava: Secondary | ICD-10-CM | POA: Insufficient documentation

## 2022-08-26 DIAGNOSIS — D49511 Neoplasm of unspecified behavior of right kidney: Secondary | ICD-10-CM

## 2022-08-26 DIAGNOSIS — E349 Endocrine disorder, unspecified: Secondary | ICD-10-CM | POA: Diagnosis not present

## 2022-08-26 DIAGNOSIS — N958 Other specified menopausal and perimenopausal disorders: Secondary | ICD-10-CM | POA: Diagnosis not present

## 2022-08-26 DIAGNOSIS — R29890 Loss of height: Secondary | ICD-10-CM | POA: Diagnosis not present

## 2022-08-26 DIAGNOSIS — N189 Chronic kidney disease, unspecified: Secondary | ICD-10-CM | POA: Diagnosis not present

## 2022-08-26 DIAGNOSIS — C641 Malignant neoplasm of right kidney, except renal pelvis: Secondary | ICD-10-CM | POA: Diagnosis not present

## 2022-08-26 DIAGNOSIS — M810 Age-related osteoporosis without current pathological fracture: Secondary | ICD-10-CM

## 2022-09-01 ENCOUNTER — Other Ambulatory Visit (HOSPITAL_COMMUNITY): Payer: Self-pay

## 2022-09-01 MED ORDER — OXYCODONE-ACETAMINOPHEN 5-325 MG PO TABS
1.0000 | ORAL_TABLET | Freq: Three times a day (TID) | ORAL | 0 refills | Status: DC | PRN
  Filled 2022-09-14: qty 90, 30d supply, fill #0

## 2022-09-01 NOTE — Telephone Encounter (Signed)
Pts pharmacy has delivered her Prolia.  Pt is to ONLY pay Pharmacy benefit: Copay $750 (MANDATORY ENROLLMENT IN COPAY ASSISTANCE PROGRAM) ...    **Pleas also inform pt that "Patient is eligible for Prolia Copay Card. Copay Card can make patient's cost as little as $25. Link to apply: [email or web address]://www.[email or web address] "

## 2022-09-04 ENCOUNTER — Other Ambulatory Visit (HOSPITAL_COMMUNITY): Payer: Self-pay

## 2022-09-04 ENCOUNTER — Encounter: Payer: Self-pay | Admitting: Internal Medicine

## 2022-09-04 DIAGNOSIS — N1831 Chronic kidney disease, stage 3a: Secondary | ICD-10-CM

## 2022-09-04 DIAGNOSIS — E785 Hyperlipidemia, unspecified: Secondary | ICD-10-CM

## 2022-09-04 DIAGNOSIS — E1121 Type 2 diabetes mellitus with diabetic nephropathy: Secondary | ICD-10-CM

## 2022-09-04 MED ORDER — ROSUVASTATIN CALCIUM 10 MG PO TABS
10.0000 mg | ORAL_TABLET | Freq: Every day | ORAL | 0 refills | Status: DC
Start: 2022-09-04 — End: 2022-12-16
  Filled 2022-09-04: qty 90, 90d supply, fill #0

## 2022-09-04 MED ORDER — EMPAGLIFLOZIN 10 MG PO TABS
10.0000 mg | ORAL_TABLET | Freq: Every day | ORAL | 0 refills | Status: DC
Start: 2022-09-04 — End: 2022-12-16
  Filled 2022-09-04: qty 90, 90d supply, fill #0

## 2022-09-14 ENCOUNTER — Other Ambulatory Visit (HOSPITAL_COMMUNITY): Payer: Self-pay

## 2022-09-22 ENCOUNTER — Encounter: Payer: Self-pay | Admitting: Internal Medicine

## 2022-09-22 ENCOUNTER — Other Ambulatory Visit (HOSPITAL_COMMUNITY): Payer: Self-pay

## 2022-09-22 MED ORDER — NEBIVOLOL HCL 10 MG PO TABS
10.0000 mg | ORAL_TABLET | Freq: Every day | ORAL | 0 refills | Status: DC
  Filled 2022-09-22: qty 90, 90d supply, fill #0

## 2022-09-26 ENCOUNTER — Encounter (HOSPITAL_COMMUNITY): Payer: Self-pay

## 2022-09-30 ENCOUNTER — Emergency Department (HOSPITAL_COMMUNITY): Payer: Commercial Managed Care - PPO

## 2022-09-30 ENCOUNTER — Other Ambulatory Visit: Payer: Self-pay

## 2022-09-30 ENCOUNTER — Encounter (HOSPITAL_COMMUNITY): Payer: Self-pay | Admitting: Emergency Medicine

## 2022-09-30 ENCOUNTER — Ambulatory Visit (HOSPITAL_COMMUNITY)
Admission: EM | Admit: 2022-09-30 | Discharge: 2022-09-30 | Disposition: A | Discharge status: Home / Self Care | Payer: Commercial Managed Care - PPO | Source: Home / Self Care

## 2022-09-30 ENCOUNTER — Emergency Department (HOSPITAL_COMMUNITY)
Admission: EM | Admit: 2022-09-30 | Discharge: 2022-10-01 | Disposition: A | Discharge status: Home / Self Care | Payer: Commercial Managed Care - PPO | Attending: Emergency Medicine | Admitting: Emergency Medicine

## 2022-09-30 DIAGNOSIS — I700 Atherosclerosis of aorta: Secondary | ICD-10-CM | POA: Diagnosis not present

## 2022-09-30 DIAGNOSIS — I959 Hypotension, unspecified: Secondary | ICD-10-CM | POA: Insufficient documentation

## 2022-09-30 DIAGNOSIS — N189 Chronic kidney disease, unspecified: Secondary | ICD-10-CM | POA: Insufficient documentation

## 2022-09-30 DIAGNOSIS — N179 Acute kidney failure, unspecified: Secondary | ICD-10-CM | POA: Insufficient documentation

## 2022-09-30 DIAGNOSIS — R509 Fever, unspecified: Secondary | ICD-10-CM

## 2022-09-30 DIAGNOSIS — Z79899 Other long term (current) drug therapy: Secondary | ICD-10-CM | POA: Insufficient documentation

## 2022-09-30 DIAGNOSIS — Z85528 Personal history of other malignant neoplasm of kidney: Secondary | ICD-10-CM | POA: Diagnosis not present

## 2022-09-30 DIAGNOSIS — I10 Essential (primary) hypertension: Secondary | ICD-10-CM | POA: Diagnosis not present

## 2022-09-30 DIAGNOSIS — Z7984 Long term (current) use of oral hypoglycemic drugs: Secondary | ICD-10-CM | POA: Insufficient documentation

## 2022-09-30 DIAGNOSIS — E1122 Type 2 diabetes mellitus with diabetic chronic kidney disease: Secondary | ICD-10-CM | POA: Insufficient documentation

## 2022-09-30 DIAGNOSIS — N1831 Chronic kidney disease, stage 3a: Secondary | ICD-10-CM | POA: Diagnosis not present

## 2022-09-30 DIAGNOSIS — Z905 Acquired absence of kidney: Secondary | ICD-10-CM | POA: Insufficient documentation

## 2022-09-30 DIAGNOSIS — D509 Iron deficiency anemia, unspecified: Secondary | ICD-10-CM | POA: Insufficient documentation

## 2022-09-30 DIAGNOSIS — R0602 Shortness of breath: Secondary | ICD-10-CM | POA: Diagnosis not present

## 2022-09-30 DIAGNOSIS — U071 COVID-19: Secondary | ICD-10-CM | POA: Insufficient documentation

## 2022-09-30 DIAGNOSIS — N289 Disorder of kidney and ureter, unspecified: Secondary | ICD-10-CM

## 2022-09-30 DIAGNOSIS — D649 Anemia, unspecified: Secondary | ICD-10-CM | POA: Diagnosis not present

## 2022-09-30 DIAGNOSIS — R7309 Other abnormal glucose: Secondary | ICD-10-CM | POA: Insufficient documentation

## 2022-09-30 DIAGNOSIS — D631 Anemia in chronic kidney disease: Secondary | ICD-10-CM | POA: Diagnosis not present

## 2022-09-30 LAB — CBC WITH DIFFERENTIAL/PLATELET
Abs Immature Granulocytes: 0.03 10*3/uL (ref 0.00–0.07)
Basophils Absolute: 0 10*3/uL (ref 0.0–0.1)
Basophils Relative: 0 %
Eosinophils Absolute: 0 10*3/uL (ref 0.0–0.5)
Eosinophils Relative: 0 %
HCT: 36.9 % (ref 36.0–46.0)
Hemoglobin: 11.7 g/dL — ABNORMAL LOW (ref 12.0–15.0)
Immature Granulocytes: 1 %
Lymphocytes Relative: 9 %
Lymphs Abs: 0.5 10*3/uL — ABNORMAL LOW (ref 0.7–4.0)
MCH: 28.6 pg (ref 26.0–34.0)
MCHC: 31.7 g/dL (ref 30.0–36.0)
MCV: 90.2 fL (ref 80.0–100.0)
Monocytes Absolute: 0.7 10*3/uL (ref 0.1–1.0)
Monocytes Relative: 12 %
Neutro Abs: 4.5 10*3/uL (ref 1.7–7.7)
Neutrophils Relative %: 78 %
Platelets: 166 10*3/uL (ref 150–400)
RBC: 4.09 MIL/uL (ref 3.87–5.11)
RDW: 13.9 % (ref 11.5–15.5)
WBC: 5.8 10*3/uL (ref 4.0–10.5)
nRBC: 0 % (ref 0.0–0.2)

## 2022-09-30 LAB — BASIC METABOLIC PANEL
Anion gap: 11 (ref 5–15)
BUN: 25 mg/dL — ABNORMAL HIGH (ref 6–20)
CO2: 20 mmol/L — ABNORMAL LOW (ref 22–32)
Calcium: 8.3 mg/dL — ABNORMAL LOW (ref 8.9–10.3)
Chloride: 106 mmol/L (ref 98–111)
Creatinine, Ser: 1.51 mg/dL — ABNORMAL HIGH (ref 0.44–1.00)
GFR, Estimated: 40 mL/min — ABNORMAL LOW (ref >60)
Glucose, Bld: 124 mg/dL — ABNORMAL HIGH (ref 70–99)
Potassium: 4.1 mmol/L (ref 3.5–5.1)
Sodium: 137 mmol/L (ref 135–145)

## 2022-09-30 LAB — POCT URINALYSIS DIP (MANUAL ENTRY)
Blood, UA: NEGATIVE
Glucose, UA: >=1000 mg/dL — AB
Leukocytes, UA: NEGATIVE
Nitrite, UA: NEGATIVE
Protein Ur, POC: 30 mg/dL — AB
Spec Grav, UA: 1.025 (ref 1.010–1.025)
Urobilinogen, UA: 0.2 E.U./dL
pH, UA: 5 (ref 5.0–8.0)

## 2022-09-30 LAB — RESP PANEL BY RT-PCR (RSV, FLU A&B, COVID)  RVPGX2
Influenza A by PCR: NEGATIVE
Influenza B by PCR: NEGATIVE
Resp Syncytial Virus by PCR: NEGATIVE
SARS Coronavirus 2 by RT PCR: POSITIVE — AB

## 2022-09-30 LAB — COMPREHENSIVE METABOLIC PANEL
ALT: 20 U/L (ref 0–44)
AST: 19 U/L (ref 15–41)
Albumin: 4.2 g/dL (ref 3.5–5.0)
Alkaline Phosphatase: 51 U/L (ref 38–126)
Anion gap: 6 (ref 5–15)
BUN: 29 mg/dL — ABNORMAL HIGH (ref 6–20)
CO2: 25 mmol/L (ref 22–32)
Calcium: 8.4 mg/dL — ABNORMAL LOW (ref 8.9–10.3)
Chloride: 104 mmol/L (ref 98–111)
Creatinine, Ser: 1.79 mg/dL — ABNORMAL HIGH (ref 0.44–1.00)
GFR, Estimated: 33 mL/min — ABNORMAL LOW (ref >60)
Glucose, Bld: 117 mg/dL — ABNORMAL HIGH (ref 70–99)
Potassium: 4.3 mmol/L (ref 3.5–5.1)
Sodium: 135 mmol/L (ref 135–145)
Total Bilirubin: 0.7 mg/dL (ref 0.3–1.2)
Total Protein: 6.6 g/dL (ref 6.5–8.1)

## 2022-09-30 LAB — CBC
HCT: 37.1 % (ref 36.0–46.0)
Hemoglobin: 11.8 g/dL — ABNORMAL LOW (ref 12.0–15.0)
MCH: 28.8 pg (ref 26.0–34.0)
MCHC: 31.8 g/dL (ref 30.0–36.0)
MCV: 90.5 fL (ref 80.0–100.0)
Platelets: 153 10*3/uL (ref 150–400)
RBC: 4.1 MIL/uL (ref 3.87–5.11)
RDW: 13.9 % (ref 11.5–15.5)
WBC: 5.7 10*3/uL (ref 4.0–10.5)
nRBC: 0 % (ref 0.0–0.2)

## 2022-09-30 LAB — POCT FASTING CBG KUC MANUAL ENTRY: POCT Glucose (KUC): 81 mg/dL (ref 70–99)

## 2022-09-30 LAB — I-STAT CG4 LACTIC ACID, ED: Lactic Acid, Venous: 0.8 mmol/L (ref 0.5–1.9)

## 2022-09-30 MED ORDER — SODIUM CHLORIDE 0.9 % IV BOLUS
1000.0000 mL | Freq: Once | INTRAVENOUS | Status: AC
  Administered 2022-09-30: 1000 mL via INTRAVENOUS

## 2022-09-30 MED ORDER — ACETAMINOPHEN 325 MG PO TABS
325.0000 mg | ORAL_TABLET | Freq: Once | ORAL | Status: AC
  Administered 2022-09-30: 325 mg via ORAL
  Filled 2022-09-30: qty 1

## 2022-09-30 NOTE — ED Notes (Signed)
Patient is being discharged from the Urgent Care and sent to the Emergency Department via POV . Per Dorann Ou, PA, patient is in need of higher level of care due to hypotension. Patient is aware and verbalizes understanding of plan of care.  Vitals:   09/30/22 1612 09/30/22 1753  BP: (!) 78/55 (!) 82/51  Pulse:  82  Resp:    Temp:    SpO2:

## 2022-09-30 NOTE — ED Triage Notes (Signed)
PT began having congestion yesterday. This morning woke up with fever, sore throat, cough, and feeling.

## 2022-09-30 NOTE — Discharge Instructions (Signed)
Please go to the emergency room for further evaluation and management since your blood pressure remains low despite bag of fluids and your kidney function is worse than normal.

## 2022-09-30 NOTE — ED Provider Notes (Signed)
[facility]    CSN: [id number] Arrival date & time: 09/30/22  1508      History   Chief Complaint Chief Complaint  Patient presents with   Cough   Fever   Sore Throat    HPI Joann Bond is a 57 y.o. female.   Patient presents today with a 2-day history of URI symptoms.  Reports sore throat, fever, cough, fatigue, congestion.  She denies any nausea, vomiting, diarrhea, abdominal pain, lightheadedness, weakness.  She denies any known sick contacts.  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  She has had COVID-19 vaccines.  She has not been taking any over-the-counter medications but is prescribed oxycodone and has been taking this as prescribed for her pain which also contains acetaminophen.  She reports her Tmax was 102.  She denies any shortness of breath or chest pain.  She does report decreased oral intake related to not feeling well.  She denies any history of heart failure or chronic liver condition.  She does have a history of stage III CKD related to partial nephrectomy last year for renal cell carcinoma.  She does have a history of diabetes and has been taking her medication as prescribed and reports her blood sugars are adequately controlled.    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, neg margins   Hypertension    Pneumonia    many years ago    Patient Active Problem List   Diagnosis Date Noted   Osteoporosis 06/18/2022   Hyperlipidemia with target LDL less than 100 11/05/2021   Malignant hypertension 09/18/2021   Type 2 diabetes mellitus with diabetic nephropathy, without long-term current use of insulin (HCC) 09/18/2021   Vitamin D deficiency 09/15/2021   Hypomagnesemia 09/15/2021   Age-related osteoporosis with current pathological fracture 01/24/2020   Encounter for general adult medical  examination with abnormal findings 01/24/2020   Stage 3a chronic kidney disease (HCC) 01/24/2020   Spinal stenosis 08/16/2019    Past Surgical History:  Procedure Laterality Date   EYE SURGERY     ROBOTIC ASSITED PARTIAL NEPHRECTOMY Right 07/25/2021   Procedure: XI ROBOTIC ASSITED  LAPAROSCOPIC PARTIAL NEPHRECTOMY;  Surgeon: Jannifer Hick, MD;  Location: [facility];  Service: Urology;  Laterality: Right;  ONLY NEEDS 240 MIN   SPINAL FUSION     Scoliosis/ age 41   SPINAL FUSION     spinal stenosis age 81   TONSILLECTOMY      OB History     Gravida  0   Para  0   Term  0   Preterm  0   AB  0   Living  0      SAB  0   IAB  0   Ectopic  0   Multiple  0   Live Births  0            Home Medications    Prior to Admission medications   Medication Sig Start Date End Date Taking? Authorizing Provider  amLODipine (NORVASC) 10 MG tablet 1 tab by mouth daily 01/23/22     empagliflozin (JARDIANCE) 10 MG TABS tablet Take 1 tablet (10 mg total) by mouth daily before breakfast. 09/04/22   Etta Grandchild, MD  gabapentin (NEURONTIN) 300 MG capsule Take one capsule (300  mg dose) by mouth 3 (three) times a day. 08/12/22     hydrALAZINE (APRESOLINE) 25 MG tablet Take 1 tablet (25 mg total) by mouth 3 (three) times daily. Follow-up due in June must see provider for future refills Patient not taking: Reported on 09/30/2022 03/11/22   Etta Grandchild, MD  Magnesium 500 MG TABS Take 1 tablet (500 mg total) by mouth 2 (two) times daily. Patient not taking: Reported on 09/30/2022 09/15/21   Plotnikov, Georgina Quint, MD  nebivolol (BYSTOLIC) 10 MG tablet Take 1 tablet (10 mg total) by mouth daily. 09/22/22   Etta Grandchild, MD  oxyCODONE-acetaminophen (PERCOCET/ROXICET) 5-325 MG tablet Take one tablet by mouth every 8 (eight) hours as needed for Pain for up to 30 days. Max Daily Amount: 3 tablets 06/07/22     oxyCODONE-acetaminophen (PERCOCET/ROXICET) 5-325 MG tablet Take one tablet by mouth every 8  (eight) hours as needed for Pain for up to 30 days. DNF 09/09/22 Max Daily Amount: 3 tablets 09/09/22     oxyCODONE-acetaminophen (PERCOCET/ROXICET) 5-325 MG tablet Take one tablet by mouth every 8 (eight) hours as needed for Pain for up to 30 days. DNF 07/11/22 Max Daily Amount: 3 tablets 07/11/22     oxyCODONE-acetaminophen (PERCOCET/ROXICET) 5-325 MG tablet Take one tablet by mouth every 8 (eight) hours as needed for Pain for up to 30 days. Max Daily Amount: 3 tablets 08/10/22     oxyCODONE-acetaminophen (PERCOCET/ROXICET) 5-325 MG tablet Take 1 tablet by mouth every 8 (eight) hours as needed for pain. Max 3 tablets. 09/13/22     PROLIA 60 MG/ML SOSY injection Inject 60 mg into the skin every 6 (six) months. 06/19/22   Quentin Angst, MD  rosuvastatin (CRESTOR) 10 MG tablet Take 1 tablet (10 mg total) by mouth daily. 09/04/22   Etta Grandchild, MD  SODIUM FLUORIDE, DENTAL RINSE, (PREVIDENT) 0.2 % SOLN USE AS AN ORAL RINSE, AS DIRECTED ON PACKAGE Patient not taking: Reported on 09/30/2022 12/02/21     tizanidine (ZANAFLEX) 2 MG capsule Take 1 capsule (2 mg total) by mouth 3 (three) times daily. 05/11/22     tizanidine (ZANAFLEX) 2 MG capsule Take one capsule (2 mg dose) by mouth 3 (three) times a day. 08/12/22     valsartan (DIOVAN) 80 MG tablet Take 1 tablet (80 mg total) by mouth 2 (two) times daily. 04/17/22   Etta Grandchild, MD    Family History Family History  Problem Relation Age of Onset   Cancer Mother    Hypertension Mother    Breast cancer Mother     Social History Social History   Tobacco Use   Smoking status: Never   Smokeless tobacco: Never  Vaping Use   Vaping status: Never Used  Substance Use Topics   Alcohol use: Never   Drug use: Never     Allergies   Bactrim [sulfamethoxazole-trimethoprim] and Cefdinir   Review of Systems Review of Systems  Constitutional:  Positive for activity change and fatigue. Negative for appetite change and fever.  HENT:  Positive for  congestion. Negative for sinus pressure, sneezing and sore throat.   Respiratory:  Positive for cough. Negative for shortness of breath.   Cardiovascular:  Negative for chest pain.  Gastrointestinal:  Negative for abdominal pain, diarrhea, nausea and vomiting.  Neurological:  Negative for dizziness, light-headedness and headaches.     Physical Exam Triage Vital Signs ED Triage Vitals  Encounter Vitals Group     BP 09/30/22 1608 (!) 72/46  Systolic BP Percentile --      Diastolic BP Percentile --      Pulse Rate 09/30/22 1608 75     Resp 09/30/22 1608 17     Temp 09/30/22 1608 98.6 F (37 C)     Temp Source 09/30/22 1608 Oral     SpO2 09/30/22 1608 94 %     Weight --      Height --      Head Circumference --      Peak Flow --      Pain Score 09/30/22 1605 5     Pain Loc --      Pain Education --      Exclude from Growth Chart --    No data found.  Updated Vital Signs BP (!) 82/51 (BP Location: Left Arm)   Pulse 82   Temp 98.6 F (37 C) (Oral)   Resp 17   SpO2 94%   Visual Acuity Right Eye Distance:   Left Eye Distance:   Bilateral Distance:    Right Eye Near:   Left Eye Near:    Bilateral Near:     Physical Exam Vitals reviewed.  Constitutional:      General: She is awake. She is not in acute distress.    Appearance: Normal appearance. She is well-developed. She is not ill-appearing.     Comments: Very pleasant female appears stated age in no acute distress sitting comfortably in exam room  HENT:     Head: Normocephalic and atraumatic.     Right Ear: Tympanic membrane, ear canal and external ear normal. Tympanic membrane is not erythematous or bulging.     Left Ear: Tympanic membrane, ear canal and external ear normal. Tympanic membrane is not erythematous or bulging.     Mouth/Throat:     Pharynx: Uvula midline. No oropharyngeal exudate or posterior oropharyngeal erythema.  Cardiovascular:     Rate and Rhythm: Normal rate and regular rhythm.     Heart  sounds: Normal heart sounds, S1 normal and S2 normal. No murmur heard. Pulmonary:     Effort: Pulmonary effort is normal.     Breath sounds: Normal breath sounds. No wheezing, rhonchi or rales.     Comments: Clear to auscultation bilaterally Psychiatric:        Behavior: Behavior is cooperative.      UC Treatments / Results  Labs (all labs ordered are listed, but only abnormal results are displayed) Labs Reviewed  CBC WITH DIFFERENTIAL/PLATELET - Abnormal; Notable for the following components:      Result Value   Hemoglobin 11.7 (*)    Lymphs Abs 0.5 (*)    All other components within normal limits  COMPREHENSIVE METABOLIC PANEL - Abnormal; Notable for the following components:   Glucose, Bld 117 (*)    BUN 29 (*)    Creatinine, Ser 1.79 (*)    Calcium 8.4 (*)    GFR, Estimated 33 (*)    All other components within normal limits  POCT URINALYSIS DIP (MANUAL ENTRY) - Abnormal; Notable for the following components:   Clarity, UA cloudy (*)    Glucose, UA >=1,000 (*)    Bilirubin, UA small (*)    Ketones, POC UA trace (5) (*)    Protein Ur, POC =30 (*)    All other components within normal limits  SARS CORONAVIRUS 2 (TAT 6-24 HRS)  POCT FASTING CBG [facility] MANUAL ENTRY    EKG   Radiology No results found.  Procedures Procedures (  including critical care time)  Medications Ordered in UC Medications  sodium chloride 0.9 % bolus 1,000 mL (0 mLs Intravenous Stopped 09/30/22 1732)    Initial Impression / Assessment and Plan / UC Course  I have reviewed the triage vital signs and the nursing notes.  Pertinent labs & imaging results that were available during my care of the patient were reviewed by me and considered in my medical decision making (see chart for details).     Patient as well as she was noted to be hypotensive on exam but reports that she generally has a low blood pressure and is not experiencing any symptoms.  Review of her previous blood pressures indicate  that with normal blood pressure generalized within normal limits.  We discussed potential utility of going to the emergency room for further evaluation and management but she declined this as she was not feeling poorly and did not feel it was necessary.  Did agree to initial workup in clinic.  UA showed glucosuria but patient is currently taking SGLT2 inhibitor.  No evidence of infection or significant dehydration.  She was given 1 L of normal saline in clinic without any improvement in her hypotension.  During this time basic labs were obtained with a CBC showing mild anemia but no acute leukocytosis.  CMP did show increased creatinine from baseline consistent with AKI.  Given her ongoing abnormal vital signs I did recommend again that she go to the emergency room.  She was reluctant but agreeable.  She declined EMS transport and will go directly to [facility], ER immediately following visit.  Peripheral IV was removed prior to her discharge.  Final Clinical Impressions(s) / UC Diagnoses   Final diagnoses:  Hypotension, unspecified hypotension type  AKI (acute kidney injury) [facility])     Discharge Instructions      Please go to the emergency room for further evaluation and management since your blood pressure remains low despite bag of fluids and your kidney function is worse than normal.     ED Prescriptions   None    PDMP not reviewed this encounter.   Jeani Hawking, PA-C 09/30/22 1818

## 2022-09-30 NOTE — ED Triage Notes (Addendum)
Pt arrives to ED c/o fever and hypotension x several days. 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 Pt with congested cough and sneezing.

## 2022-10-01 ENCOUNTER — Other Ambulatory Visit (HOSPITAL_COMMUNITY): Payer: Self-pay

## 2022-10-01 LAB — CULTURE, BLOOD (ROUTINE X 2): Special Requests: ADEQUATE

## 2022-10-01 LAB — SARS CORONAVIRUS 2 (TAT 6-24 HRS): SARS Coronavirus 2: POSITIVE — AB

## 2022-10-01 MED ORDER — PAXLOVID (150/100) 10 X 150 MG & 10 X 100MG PO TBPK
2.0000 | ORAL_TABLET | Freq: Two times a day (BID) | ORAL | 0 refills | Status: AC
Start: 2022-10-01 — End: 2022-10-06
  Filled 2022-10-01: qty 20, 5d supply, fill #0

## 2022-10-01 MED ORDER — SODIUM CHLORIDE 0.9 % IV BOLUS
1000.0000 mL | Freq: Once | INTRAVENOUS | Status: AC
  Administered 2022-10-01: 1000 mL via INTRAVENOUS

## 2022-10-01 NOTE — ED Notes (Signed)
ED Provider at bedside. 

## 2022-10-01 NOTE — ED Provider Notes (Signed)
[place] EMERGENCY DEPARTMENT AT [facility] Provider Note   CSN: [id number] Arrival date & time: 09/30/22  2115     History  Chief Complaint  Patient presents with   Fever   Cough    Joann Bond is a 57 y.o. female.  The history is provided by the patient.  Fever Associated symptoms: cough   Cough Associated symptoms: fever   She has history of chronic kidney disease status post nephrectomy for cancer and was sent here from urgent care because of low blood pressure.  She started feeling generally poorly yesterday and noted fever today as high as 102.1.  She denies chills or sweats.  There has been slight sore throat but no cough or nausea or diarrhea.  She denies urinary urgency or frequency.  She denies arthralgias or myalgias.  She denies any sick contacts, but does work as a Engineer, civil (consulting).  She went to urgent care where she was noted to be markedly hypotensive and was told to come to the emergency department.  She states she is feeling generally weak but denies dizziness or lightheadedness.  She received IV fluids at urgent care, and states she does feel somewhat better following that.   Home Medications Prior to Admission medications   Medication Sig Start Date End Date Taking? Authorizing Provider  nirmatrelvir & ritonavir (PAXLOVID, 150/100,) 10 x 150 MG & 10 x 100MG  TBPK Take 2 tablets by mouth 2 (two) times daily for 5 days. 10/01/22 10/06/22 Yes Dione Booze, MD  amLODipine (NORVASC) 10 MG tablet 1 tab by mouth daily 01/23/22     empagliflozin (JARDIANCE) 10 MG TABS tablet Take 1 tablet (10 mg total) by mouth daily before breakfast. 09/04/22   Etta Grandchild, MD  gabapentin (NEURONTIN) 300 MG capsule Take one capsule (300 mg dose) by mouth 3 (three) times a day. 08/12/22     hydrALAZINE (APRESOLINE) 25 MG tablet Take 1 tablet (25 mg total) by mouth 3 (three) times daily. Follow-up due in June must see provider for future refills Patient not taking: Reported on 09/30/2022  03/11/22   Etta Grandchild, MD  Magnesium 500 MG TABS Take 1 tablet (500 mg total) by mouth 2 (two) times daily. Patient not taking: Reported on 09/30/2022 09/15/21   Plotnikov, Georgina Quint, MD  nebivolol (BYSTOLIC) 10 MG tablet Take 1 tablet (10 mg total) by mouth daily. 09/22/22   Etta Grandchild, MD  oxyCODONE-acetaminophen (PERCOCET/ROXICET) 5-325 MG tablet Take one tablet by mouth every 8 (eight) hours as needed for Pain for up to 30 days. Max Daily Amount: 3 tablets 06/07/22     oxyCODONE-acetaminophen (PERCOCET/ROXICET) 5-325 MG tablet Take one tablet by mouth every 8 (eight) hours as needed for Pain for up to 30 days. DNF 09/09/22 Max Daily Amount: 3 tablets 09/09/22     oxyCODONE-acetaminophen (PERCOCET/ROXICET) 5-325 MG tablet Take one tablet by mouth every 8 (eight) hours as needed for Pain for up to 30 days. DNF 07/11/22 Max Daily Amount: 3 tablets 07/11/22     oxyCODONE-acetaminophen (PERCOCET/ROXICET) 5-325 MG tablet Take one tablet by mouth every 8 (eight) hours as needed for Pain for up to 30 days. Max Daily Amount: 3 tablets 08/10/22     oxyCODONE-acetaminophen (PERCOCET/ROXICET) 5-325 MG tablet Take 1 tablet by mouth every 8 (eight) hours as needed for pain. Max 3 tablets. 09/13/22     PROLIA 60 MG/ML SOSY injection Inject 60 mg into the skin every 6 (six) months. 06/19/22   Quentin Angst,  MD  rosuvastatin (CRESTOR) 10 MG tablet Take 1 tablet (10 mg total) by mouth daily. 09/04/22   Etta Grandchild, MD  SODIUM FLUORIDE, DENTAL RINSE, (PREVIDENT) 0.2 % SOLN USE AS AN ORAL RINSE, AS DIRECTED ON PACKAGE Patient not taking: Reported on 09/30/2022 12/02/21     tizanidine (ZANAFLEX) 2 MG capsule Take 1 capsule (2 mg total) by mouth 3 (three) times daily. 05/11/22     tizanidine (ZANAFLEX) 2 MG capsule Take one capsule (2 mg dose) by mouth 3 (three) times a day. 08/12/22     valsartan (DIOVAN) 80 MG tablet Take 1 tablet (80 mg total) by mouth 2 (two) times daily. 04/17/22   Etta Grandchild, MD      Allergies     Bactrim [sulfamethoxazole-trimethoprim] and Cefdinir    Review of Systems   Review of Systems  Constitutional:  Positive for fever.  Respiratory:  Positive for cough.   All other systems reviewed and are negative.   Physical Exam Updated Vital Signs BP 110/67 (BP Location: Right Arm)   Pulse 85   Temp (!) 102 F (38.9 C)   Resp 16   Ht 5' (1.524 m)   Wt 63.5 kg   SpO2 97%   BMI 27.34 kg/m  Physical Exam Vitals and nursing note reviewed.   57 year old female, resting comfortably and in no acute distress. Vital signs are significant for elevated temperature. Oxygen saturation is 97%, which is normal. Head is normocephalic and atraumatic. PERRLA, EOMI. Oropharynx is clear. Neck is nontender and supple without adenopathy. Lungs are clear without rales, wheezes, or rhonchi. Chest is nontender. Heart has regular rate and rhythm without murmur. Abdomen is soft, flat, nontender. Extremities have no cyanosis or edema, full range of motion is present. Skin is warm and dry without rash. Neurologic: Mental status is normal, cranial nerves are intact, moves all extremities equally.  ED Results / Procedures / Treatments   Labs (all labs ordered are listed, but only abnormal results are displayed) Labs Reviewed  RESP PANEL BY RT-PCR (RSV, FLU A&B, COVID)  RVPGX2 - Abnormal; Notable for the following components:      Result Value   SARS Coronavirus 2 by RT PCR POSITIVE (*)    All other components within normal limits  CBC - Abnormal; Notable for the following components:   Hemoglobin 11.8 (*)    All other components within normal limits  BASIC METABOLIC PANEL - Abnormal; Notable for the following components:   CO2 20 (*)    Glucose, Bld 124 (*)    BUN 25 (*)    Creatinine, Ser 1.51 (*)    Calcium 8.3 (*)    GFR, Estimated 40 (*)    All other components within normal limits  CULTURE, BLOOD (ROUTINE X 2)  CULTURE, BLOOD (ROUTINE X 2)  I-STAT CG4 LACTIC ACID, ED    EKG EKG  Interpretation Date/Time:  Wednesday September 30 2022 21:57:14 EDT Ventricular Rate:  87 PR Interval:  164 QRS Duration:  78 QT Interval:  370 QTC Calculation: 445 R Axis:   82  Text Interpretation: Normal sinus rhythm Biatrial enlargement Septal infarct , age undetermined Abnormal ECG No previous ECGs available Confirmed by Dione Booze ([id number]) on 09/30/2022 11:09:12 PM  Radiology DG Chest 2 View  Result Date: 09/30/2022 CLINICAL DATA:  Shortness of breath EXAM: CHEST - 2 VIEW COMPARISON:  Radiographs 08/26/2022 FINDINGS: Stable cardiomediastinal silhouette. Aortic atherosclerotic calcification. Bibasilar scarring. No focal consolidation, pleural effusion, or pneumothorax. No displaced  rib fractures. Postoperative changes about the spine. IMPRESSION: No acute cardiopulmonary disease. Electronically Signed   By: Minerva Fester M.D.   On: 09/30/2022 23:02    Procedures Procedures    Medications Ordered in ED Medications  acetaminophen (TYLENOL) tablet 325 mg (325 mg Oral Given 09/30/22 2141)    ED Course/ Medical Decision Making/ A&P                                 Medical Decision Making Amount and/or Complexity of Data Reviewed Labs: ordered. Radiology: ordered.  Risk Prescription drug management.   Fever with report of hypotension.  No symptoms other than sore throat to localized source of infection.  Consider viral illnesses such as influenza, COVID-19, other viruses.  Consider occult pneumonia, occult UTI.  I reviewed her past records, and note urgent care visit earlier today with labs showing normal WBC, elevated serum creatinine higher than it was in June, but I note her creatinine levels have tended to swing widely between normal and 2.0.  Documented blood pressures ranged from initial of 72/46-82/51, both in sitting position.  I have reviewed her electrocardiogram, and my interpretation is biatrial enlargement but no acute ST or T changes.  Chest x-ray shows no evidence  of pneumonia.  Have independently viewed the images, and agree with radiologist's interpretation.  I have reviewed her laboratory tests, my interpretation is normal WBC, stable anemia, normal lactic acid level, elevated random glucose level which will need to be followed as an outpatient, elevated creatinine which has come down compared with earlier today and is still solidly in the range that she has been at in the past.  Respiratory pathogen panel is significant for positive PCR for COVID-19, negative PCR for influenza and RSV.  COVID infection is the cause of her fever.  She appears well and had normal lactic acid level.  I have ordered orthostatic vital sign.  If they are not abnormal, I feel she can safely be discharged.  I explained risks and benefits of antiviral treatment, and through shared decision making, decision was made to give the patient a course of Paxlovid for her COVID-19 infection.  Orthostatic vital signs did show significant drop in blood pressure although patient was not symptomatic with this.  I have ordered additional IV fluids and plan on discharge following that.  Final Clinical Impression(s) / ED Diagnoses Final diagnoses:  COVID-19 virus infection  Fever in adult  Renal insufficiency  Normochromic normocytic anemia  Elevated random blood glucose level    Rx / DC Orders ED Discharge Orders          Ordered    nirmatrelvir & ritonavir (PAXLOVID, 150/100,) 10 x 150 MG & 10 x 100MG  TBPK  2 times daily        10/01/22 0052              Dione Booze, MD 10/01/22 [phone]

## 2022-10-01 NOTE — Discharge Instructions (Signed)
Drink plenty of fluids.  Take ibuprofen and/or acetaminophen as needed for fever or aching.  Return if you are having any problems.

## 2022-10-05 LAB — CULTURE, BLOOD (ROUTINE X 2): Culture: NO GROWTH

## 2022-10-12 ENCOUNTER — Other Ambulatory Visit (HOSPITAL_COMMUNITY): Payer: Self-pay

## 2022-10-12 MED ORDER — OXYCODONE-ACETAMINOPHEN 5-325 MG PO TABS
1.0000 | ORAL_TABLET | Freq: Three times a day (TID) | ORAL | 0 refills | Status: DC | PRN
Start: 2022-10-14 — End: 2023-04-14
  Filled 2022-10-14: qty 90, 30d supply, fill #0

## 2022-10-12 MED ORDER — OXYCODONE-ACETAMINOPHEN 5-325 MG PO TABS
1.0000 | ORAL_TABLET | Freq: Three times a day (TID) | ORAL | 0 refills | Status: DC | PRN
Start: 2022-12-13 — End: 2023-04-14
  Filled 2023-01-18: qty 90, 30d supply, fill #0

## 2022-10-12 MED ORDER — OXYCODONE-ACETAMINOPHEN 5-325 MG PO TABS
1.0000 | ORAL_TABLET | Freq: Three times a day (TID) | ORAL | 0 refills | Status: DC | PRN
Start: 2022-11-13 — End: 2023-04-14
  Filled 2022-11-16: qty 90, 30d supply, fill #0

## 2022-10-14 ENCOUNTER — Other Ambulatory Visit (HOSPITAL_COMMUNITY): Payer: Self-pay

## 2022-10-20 DIAGNOSIS — N1831 Chronic kidney disease, stage 3a: Secondary | ICD-10-CM | POA: Diagnosis not present

## 2022-10-20 DIAGNOSIS — I129 Hypertensive chronic kidney disease with stage 1 through stage 4 chronic kidney disease, or unspecified chronic kidney disease: Secondary | ICD-10-CM | POA: Diagnosis not present

## 2022-10-21 LAB — LAB REPORT - SCANNED
Creatinine, POC: 82.3 mg/dL
EGFR: 71

## 2022-10-26 ENCOUNTER — Other Ambulatory Visit: Payer: Self-pay

## 2022-10-26 ENCOUNTER — Encounter: Payer: Self-pay | Admitting: Internal Medicine

## 2022-10-26 ENCOUNTER — Other Ambulatory Visit (HOSPITAL_COMMUNITY): Payer: Self-pay

## 2022-10-26 MED ORDER — VALSARTAN 80 MG PO TABS
80.0000 mg | ORAL_TABLET | Freq: Two times a day (BID) | ORAL | 1 refills | Status: DC
  Filled 2022-10-26: qty 180, 90d supply, fill #0

## 2022-10-29 ENCOUNTER — Encounter: Payer: Self-pay | Admitting: Nephrology

## 2022-11-04 ENCOUNTER — Other Ambulatory Visit: Payer: Self-pay | Admitting: Internal Medicine

## 2022-11-04 DIAGNOSIS — Z1231 Encounter for screening mammogram for malignant neoplasm of breast: Secondary | ICD-10-CM

## 2022-11-10 LAB — HM DIABETES EYE EXAM

## 2022-11-16 ENCOUNTER — Other Ambulatory Visit (HOSPITAL_COMMUNITY): Payer: Self-pay

## 2022-11-27 ENCOUNTER — Other Ambulatory Visit (HOSPITAL_COMMUNITY): Payer: Self-pay

## 2022-11-27 MED ORDER — GABAPENTIN 300 MG PO CAPS
300.0000 mg | ORAL_CAPSULE | Freq: Three times a day (TID) | ORAL | 0 refills | Status: DC
  Filled 2022-11-27: qty 270, 90d supply, fill #0

## 2022-12-08 ENCOUNTER — Other Ambulatory Visit: Payer: Self-pay | Admitting: Pharmacist

## 2022-12-08 ENCOUNTER — Telehealth: Payer: Self-pay | Admitting: Pharmacist

## 2022-12-08 ENCOUNTER — Encounter: Payer: Self-pay | Admitting: Pharmacist

## 2022-12-08 ENCOUNTER — Ambulatory Visit: Payer: Commercial Managed Care - PPO | Attending: Internal Medicine | Admitting: Pharmacist

## 2022-12-08 DIAGNOSIS — Z79899 Other long term (current) drug therapy: Secondary | ICD-10-CM

## 2022-12-08 NOTE — Telephone Encounter (Signed)
Called patient to schedule an appointment for the [employer] [facility]. I was unable to reach the patient so I left a HIPAA-compliant message requesting that the patient return my call.   Luke Van Ausdall, PharmD, BCACP, CPP Clinical Pharmacist [facility] [phone]  

## 2022-12-08 NOTE — Progress Notes (Signed)
See OV from 12/08/22 for full documentation.  Butch Penny, PharmD, Patsy [place], CPP Clinical Pharmacist [facility] & [facility] [phone]

## 2022-12-08 NOTE — Progress Notes (Signed)
S:  Patient presents for review of their specialty medication therapy.  Patient is currently taking Prolia for osteoporosis. Patient is managed by Dr. Yetta Barre for this.  Adherence: confirms   Efficacy: reports that she believes the medication is working for her.   Dosing: 60 mg once every 6 months.  Dose adjustments: Renal: Monitor patients with severe impairment (CrCl <30 mL/minute or on dialysis) closely, as significant and prolonged hypocalcemia (incidence of 29% and potentially lasting weeks to months) and marked elevations of serum parathyroid hormone are serious risks in this population. Ensure adequate calcium and vitamin D intake/supplementation. CrCl >=30 mL/minute: No dosage adjustment necessary. CrCl <30 mL/minute: No dosage adjustment necessary; use in conjunction with guidance from patient's nephrology team. Hepatic: no dose adjustments (has not been studied)  Drug-drug interactions: none  Monitoring: S/sx of infection: none S/sx of hypersensitivity: none S/sx of hypocalcemia/hypercalcemia: none Dermatitis/skin rash: none Peripheral edema: none HA: none GI upset:none   Other side effects: none  Last bone density study: 08/2022   O:      Lab Results  Component Value Date   WBC 5.7 09/30/2022   HGB 11.8 (L) 09/30/2022   HCT 37.1 09/30/2022   MCV 90.5 09/30/2022   PLT 153 09/30/2022      Chemistry      Component Value Date/Time   NA 137 09/30/2022 2144   K 4.1 09/30/2022 2144   CL 106 09/30/2022 2144   CO2 20 (L) 09/30/2022 2144   BUN 25 (H) 09/30/2022 2144   CREATININE 1.51 (H) 09/30/2022 2144      Component Value Date/Time   CALCIUM 8.3 (L) 09/30/2022 2144   ALKPHOS 51 09/30/2022 1630   AST 19 09/30/2022 1630   ALT 20 09/30/2022 1630   BILITOT 0.7 09/30/2022 1630       A/P: 1. Medication review: Patient is taking Prolia for osteoporosis. Reviewed the medication with the patient, including the following: Prolia (denosumab) is a monoclonal  antibody with affinity for nuclear factor-kappa ligand (RANKL). Prolia binds to RANKL and prevents osteoclast formation, leading to decreased bone resorption and increased bone mass in osteoporosis. Patient educated on purpose, proper use, and potential adverse effects of Prolia. The most common adverse effects are hypersensitivities, peripheral edema, dermatitis/skin rash, GI upset, HA, joint pain, and infection. There is the possibility of atypical femur fracture, serum calcium disturbances, and osteonecrosis of the jaw. Patients should monitor for and report hip, thigh, or groin pain. Additionally, patients should monitor for and report jaw pain, tooth/periodontal infection, toothache, and/or gingival ulceration/erosion. Prolia exists as a solution prefilled syringe for SQ administration. Administration: Denosumab is intended for SubQ route only and should not be administered IV, IM, or intradermally. Prior to administration, bring to room temperature in original container (allow to stand ~15 to 30 minutes); do not warm by any other method. Solution may contain trace amounts of translucent to white protein particles; do not use if cloudy, discolored (normal solution should be clear and colorless to pale yellow), or contains excessive particles or foreign matter. Avoid vigorous shaking. Administer via SubQ injection in the upper arm, upper thigh, or abdomen; should only be administered by a health care professional.  Butch Penny, PharmD, BCACP, CPP Clinical Pharmacist [facility] & [facility] [phone]

## 2022-12-11 IMAGING — MR MR ABDOMEN WO/W CM
11 of 17 series · 28 of 48 positions shown · IV contrast (multihance)
Comparison: 10/02/2020

CLINICAL DATA: Follow-up complex cystic lesion of right kidney.

EXAM:
MRI ABDOMEN WITHOUT AND WITH CONTRAST
TECHNIQUE: Multiplanar multisequence MR imaging of the abdomen was performed
both before and after the administration of intravenous contrast.
CONTRAST:  14mL MULTIHANCE GADOBENATE DIMEGLUMINE 529 MG/ML IV SOLN

[Series 4: cor haste · coronal · 5.0mm · 0.68mm/px · 2 of 30 slices shown]
[im 1/30]
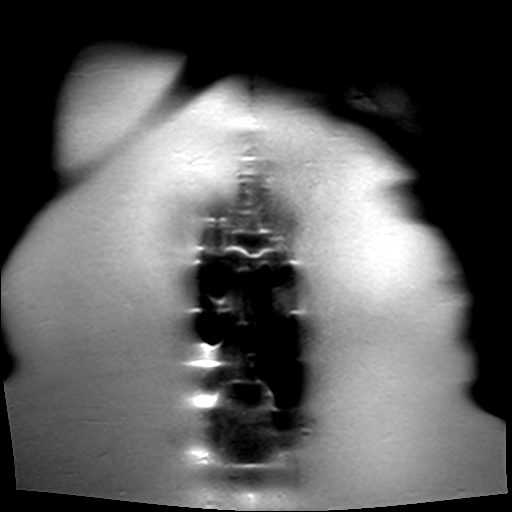
[im 30/30]
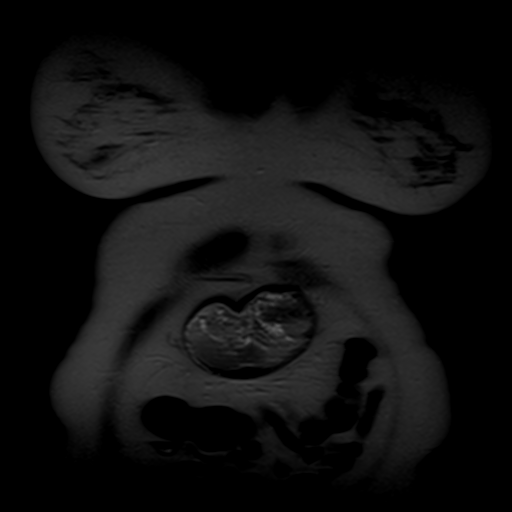

[Series 5: axial haste · axial · 6.0mm · 0.68mm/px · z∈[-98,+131]mm · 2 of 36 slices shown]
[im 1/36]
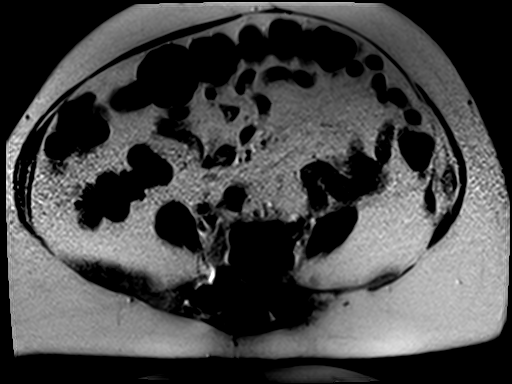
[im 36/36]
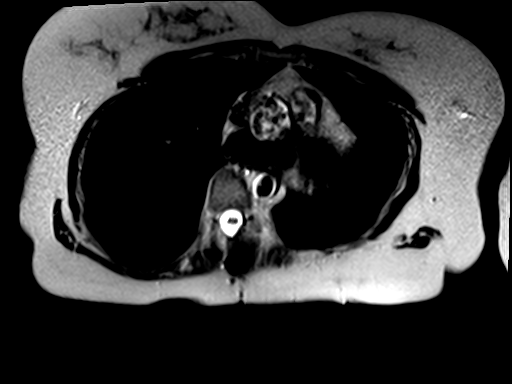

[Series 6: T1 · axial · 6.0mm · 0.68mm/px · z∈[-98,+131]mm · 4 of 72 slices shown]
[im 1/72]
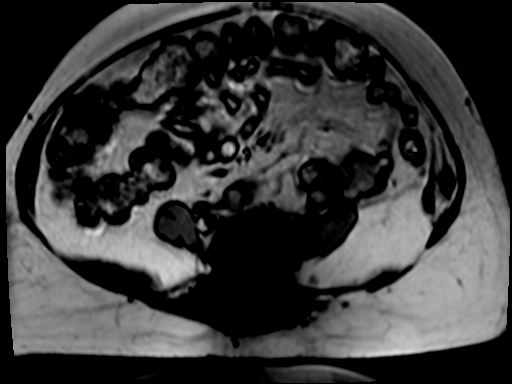
[im 24/72]
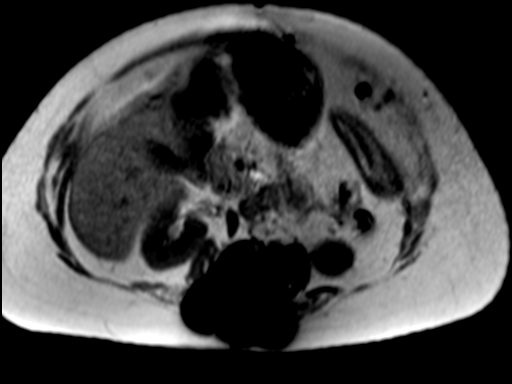
[im 48/72]
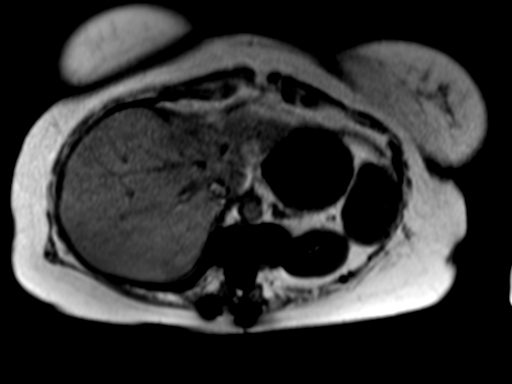
[im 72/72]
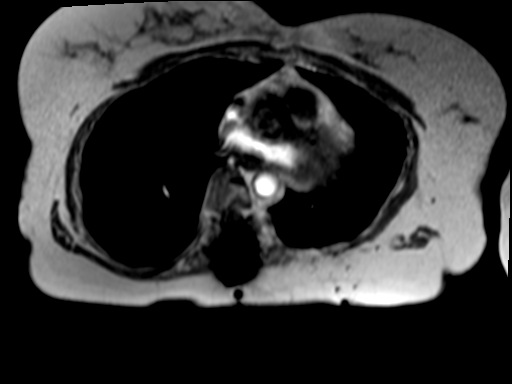

[Series 7: bSSFP · axial · 4.0mm · 0.68mm/px · z∈[-95,+127]mm · 3 of 57 slices shown]
[im 1/57]
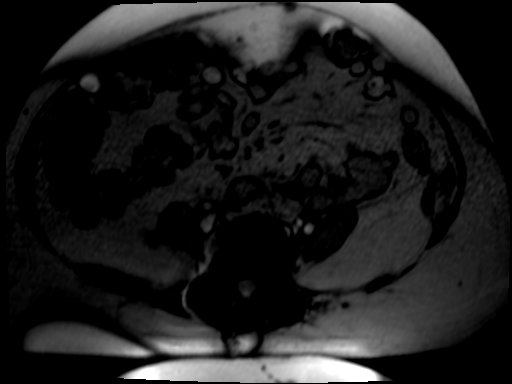
[im 29/57]
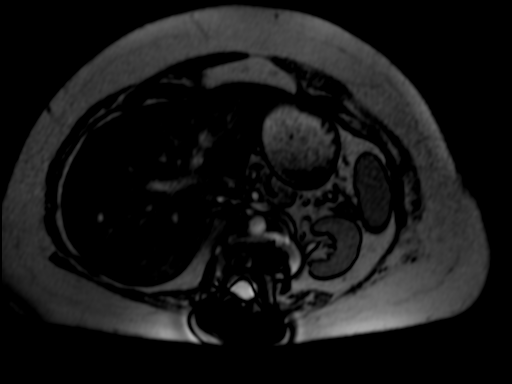
[im 57/57]
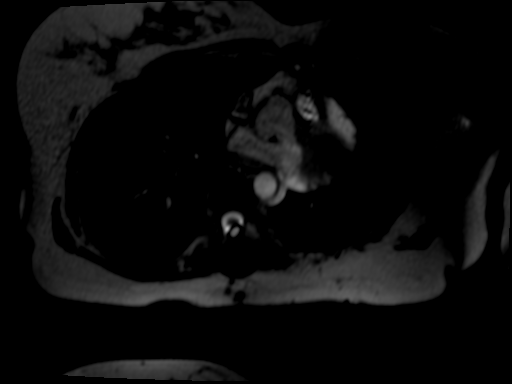

[Series 8: T2 fat-sat · axial · 6.0mm · 1.09mm/px · z∈[-78,+174]mm · 2 of 36 slices shown]
[im 1/36]
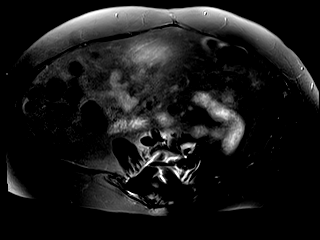
[im 36/36]
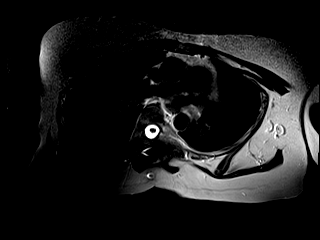

[Series 9: ep2d_diff_b50_500_800_p2_trig · axial · 6.0mm · 1.82mm/px · z∈[-78,+174]mm · 4 of 105 slices shown]
[im 1/105]
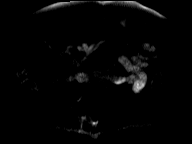
[im 35/105]
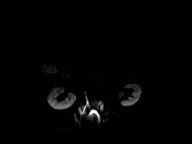
[im 70/105]
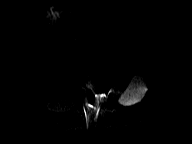
[im 105/105]
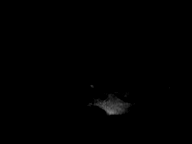

[Series 10: ep2d_diff_b50_500_800_p2_trig_adc · axial · 6.0mm · 1.82mm/px · 1 of 36 slices shown]
[im 1/36]
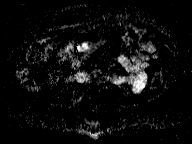

[Series 11: T1 dynamic · axial · non-contrast · 2.5mm · 0.74mm/px · z∈[-87,+128]mm · 3 of 88 slices shown]
[im 1/88]
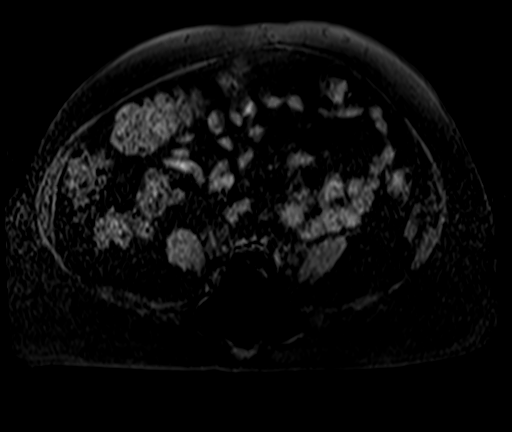
[im 44/88]
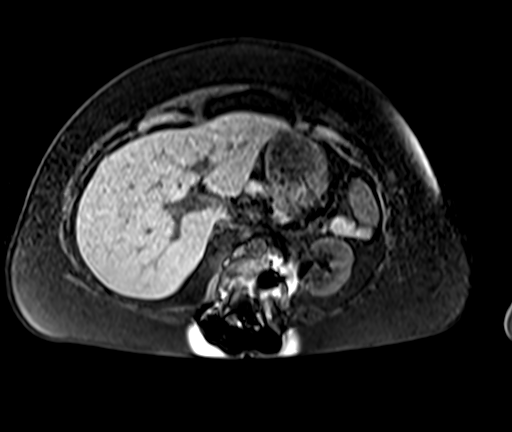
[im 88/88]
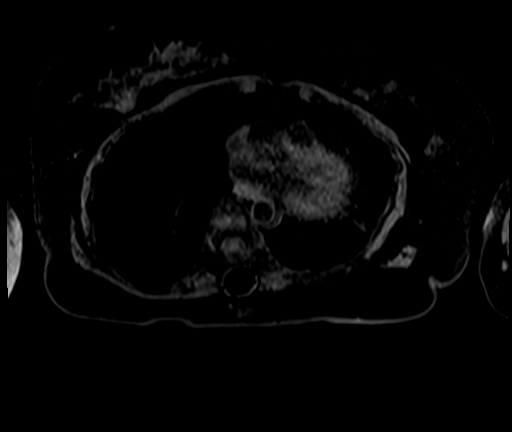

[Series 12: T1 dynamic post-contrast · axial · 2.5mm · 0.74mm/px · z∈[-87,+128]mm · 3 of 88 slices shown (1 of 3)]
[im 1/88]
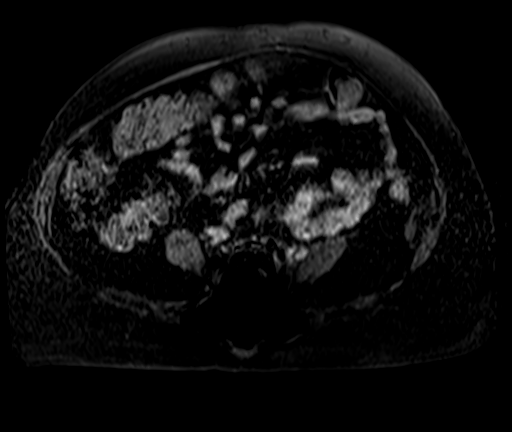
[im 44/88]
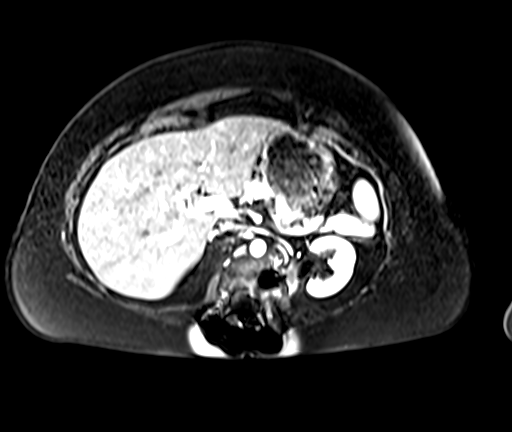
[im 88/88]
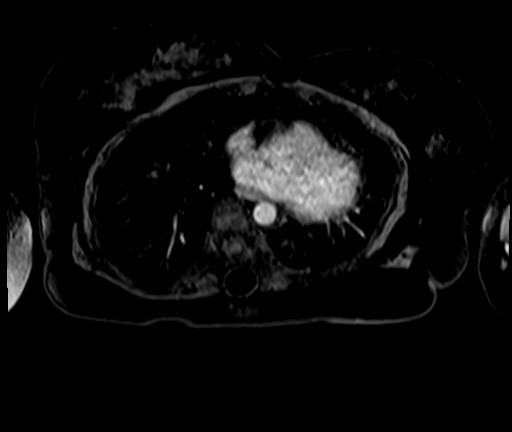

[Series 13: T1 dynamic post-contrast · axial · 2.5mm · 0.74mm/px · z∈[-87,+128]mm · 3 of 88 slices shown (2 of 3)]
[im 1/88]
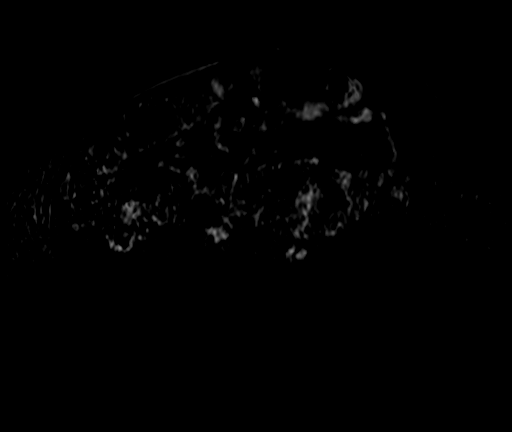
[im 44/88]
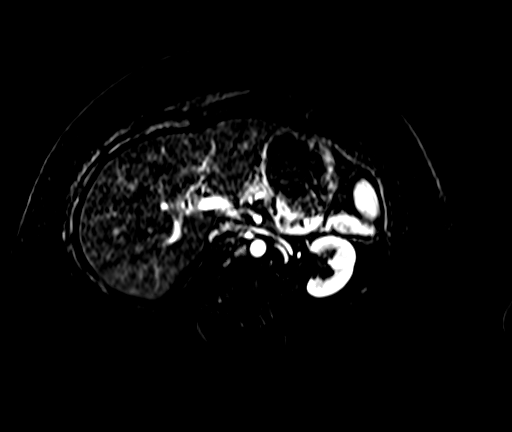
[im 88/88]
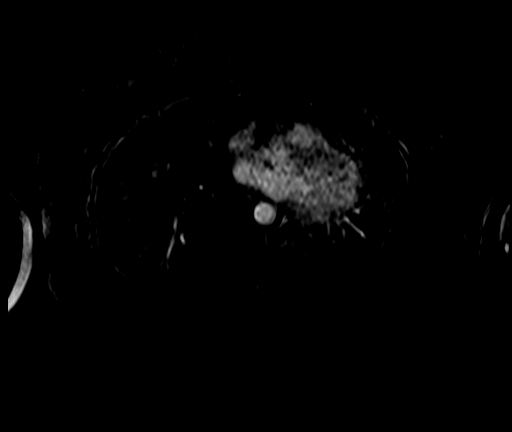

[Series 14: T1 dynamic post-contrast · axial · 2.5mm · 0.74mm/px · 1 of 88 slices shown (3 of 3)]
[im 1/88]
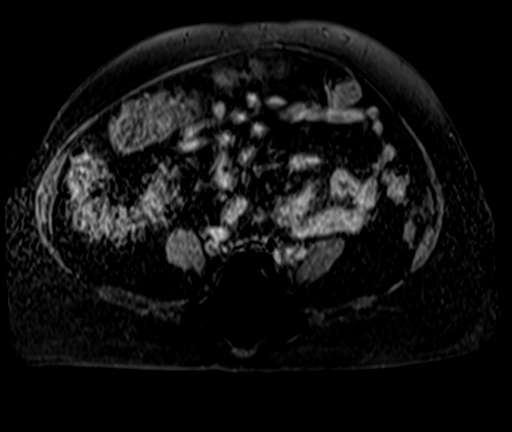

[28 of 48 positions shown; findings below may reference images not displayed]

FINDINGS: Lower chest: No acute findings.

Hepatobiliary: No hepatic masses identified. A few tiny sub-cm
hepatic cysts remains stable. Gallbladder is unremarkable. No
evidence of biliary ductal dilatation.

Pancreas:  No mass or inflammatory changes.

Spleen:  Within normal limits in size and appearance.

Adrenals/Urinary Tract: Normal appearance both adrenal glands and
left kidney. A few sub-cm simple cysts are again noted in the right
kidney. A complex cystic lesion is again seen which is subcapsular
in location in the lateral lower pole of the right kidney. This
measures 1.9 by 1.4 cm on image 33/8, compared to 1.4 x 1.1 cm
previously. Several enhancing internal septations are seen, which
have a more thickened appearance on today's exam, although no solid
enhancing nodules are seen. This is consistent with a Bosniak
category 3 cystic lesion.

Stomach/Bowel: Unremarkable.

Vascular/Lymphatic: No pathologically enlarged lymph nodes
identified. No acute vascular findings.

Other:  None.

Musculoskeletal:  No suspicious bone lesions identified.
IMPRESSION: Mild increase in size of 1.9 cm complex cystic lesion in the lower
pole of the right kidney, most consistent with a Bosniak category 3
cystic lesion. Bosniak III masses have intermediate probability of
being malignant. If not already obtained, Urology consultation is
recommended. (Reference: Bosniak Classification of Cystic Renal

No evidence of abdominal metastatic disease or other acute findings.

## 2022-12-15 ENCOUNTER — Ambulatory Visit
Admission: RE | Admit: 2022-12-15 | Discharge: 2022-12-15 | Disposition: A | Discharge status: Home / Self Care | Payer: Commercial Managed Care - PPO | Source: Ambulatory Visit | Attending: Internal Medicine | Admitting: Internal Medicine

## 2022-12-15 DIAGNOSIS — Z1231 Encounter for screening mammogram for malignant neoplasm of breast: Secondary | ICD-10-CM

## 2022-12-16 ENCOUNTER — Other Ambulatory Visit (HOSPITAL_COMMUNITY): Payer: Self-pay

## 2022-12-16 ENCOUNTER — Encounter: Payer: Self-pay | Admitting: Internal Medicine

## 2022-12-16 ENCOUNTER — Other Ambulatory Visit: Payer: Self-pay | Admitting: Internal Medicine

## 2022-12-16 DIAGNOSIS — N1831 Chronic kidney disease, stage 3a: Secondary | ICD-10-CM

## 2022-12-16 DIAGNOSIS — E785 Hyperlipidemia, unspecified: Secondary | ICD-10-CM

## 2022-12-16 DIAGNOSIS — E1121 Type 2 diabetes mellitus with diabetic nephropathy: Secondary | ICD-10-CM

## 2022-12-16 MED ORDER — EMPAGLIFLOZIN 10 MG PO TABS
10.0000 mg | ORAL_TABLET | Freq: Every day | ORAL | 0 refills | Status: DC
  Filled 2022-12-16: qty 90, 90d supply, fill #0

## 2022-12-16 MED ORDER — ROSUVASTATIN CALCIUM 10 MG PO TABS
10.0000 mg | ORAL_TABLET | Freq: Every day | ORAL | 0 refills | Status: DC
  Filled 2022-12-16: qty 90, 90d supply, fill #0

## 2022-12-24 ENCOUNTER — Other Ambulatory Visit: Payer: Self-pay

## 2022-12-25 ENCOUNTER — Encounter: Payer: Self-pay | Admitting: Internal Medicine

## 2022-12-25 ENCOUNTER — Other Ambulatory Visit: Payer: Self-pay

## 2022-12-25 NOTE — Telephone Encounter (Signed)
 Care team updated and letter sent for eye exam notes.

## 2022-12-28 ENCOUNTER — Other Ambulatory Visit: Payer: Self-pay

## 2022-12-28 NOTE — Progress Notes (Signed)
Specialty Pharmacy Refill Coordination Note  Joann Bond is a 58 y.o. female contacted today regarding refills of specialty medication(s) Denosumab (Prolia)   Patient requested Courier to Provider Office   Delivery date: 01/12/23   Verified address: [facility] [address] [place] [address]   Medication will be filled on 01/11/23.

## 2023-01-11 ENCOUNTER — Other Ambulatory Visit: Payer: Self-pay | Admitting: Internal Medicine

## 2023-01-11 ENCOUNTER — Other Ambulatory Visit: Payer: Self-pay

## 2023-01-11 ENCOUNTER — Other Ambulatory Visit (HOSPITAL_COMMUNITY): Payer: Self-pay

## 2023-01-11 ENCOUNTER — Encounter: Payer: Self-pay | Admitting: Internal Medicine

## 2023-01-12 ENCOUNTER — Other Ambulatory Visit (HOSPITAL_COMMUNITY): Payer: Self-pay

## 2023-01-12 MED ORDER — NEBIVOLOL HCL 10 MG PO TABS
10.0000 mg | ORAL_TABLET | Freq: Every day | ORAL | 0 refills | Status: DC
  Filled 2023-01-12: qty 90, 90d supply, fill #0

## 2023-01-15 ENCOUNTER — Telehealth: Payer: Self-pay

## 2023-01-15 NOTE — Telephone Encounter (Signed)
 Prolia VOB initiated via [email or web address]  Next Prolia inj DUE: 01/17/23

## 2023-01-18 ENCOUNTER — Other Ambulatory Visit (HOSPITAL_COMMUNITY): Payer: Self-pay

## 2023-01-18 ENCOUNTER — Ambulatory Visit: Payer: Commercial Managed Care - PPO

## 2023-01-18 NOTE — Telephone Encounter (Signed)
 From the 10 th to the 24 th is that not 2 weeks? Please advise

## 2023-01-18 NOTE — Telephone Encounter (Signed)
 Marland Kitchen

## 2023-01-18 NOTE — Telephone Encounter (Signed)
 Pharmacy Patient Advocate Encounter   Received notification from  [employer] Portal that prior authorization for Prolia  is required/requested.   Insurance verification completed.   The patient is insured through [employer] .   Per test claim: PA required; PA submitted to above mentioned insurance via [employer] Key/confirmation #/[id number] Status is pending

## 2023-01-20 ENCOUNTER — Other Ambulatory Visit (HOSPITAL_COMMUNITY): Payer: Self-pay

## 2023-01-20 ENCOUNTER — Encounter: Payer: Self-pay | Admitting: Internal Medicine

## 2023-01-20 ENCOUNTER — Other Ambulatory Visit: Payer: Self-pay

## 2023-01-20 MED ORDER — VALSARTAN 80 MG PO TABS
80.0000 mg | ORAL_TABLET | Freq: Two times a day (BID) | ORAL | 0 refills | Status: DC
  Filled 2023-01-20: qty 27, 14d supply, fill #0

## 2023-01-20 MED ORDER — AMLODIPINE BESYLATE 10 MG PO TABS
10.0000 mg | ORAL_TABLET | Freq: Every day | ORAL | 0 refills | Status: DC
  Filled 2023-01-20: qty 27, 27d supply, fill #0

## 2023-01-20 NOTE — Telephone Encounter (Signed)
 Pt ready for scheduling for Prolia  on or after : 01/18/23  Out-of-pocket cost due at time of visit: $120  Number of injection/visits approved: --  Primary: [employer] - Commercial Prolia  co-insurance: $60 Admin fee co-insurance: $60  Secondary: N/A Prolia  co-insurance:  Admin fee co-insurance:   Medical Benefit Details: Date Benefits were checked: 01/15/23 Deductible: no/ Coinsurance: $60/ Admin Fee: $60  Prior Auth: Approved PA# [id number] Expiration Date: 01/18/23 to 01/17/24  # of doses approved:  Pharmacy benefit: Copay $refill too soon - last filled 01/11/23 If patient wants fill through the pharmacy benefit please send prescription to:  [facility] , and include estimated need by date in rx notes. Pharmacy will ship medication directly to the office.  Patient not eligible for Prolia  Copay Card. Copay Card can make patient's cost as little as $25. Link to apply: [email or web address]://www.amgensupportplus.com/copay  ** This summary of benefits is an estimation of the patient's out-of-pocket cost. Exact cost may very based on individual plan coverage.

## 2023-01-20 NOTE — Telephone Encounter (Signed)
 Pharmacy Patient Advocate Encounter  Received notification from [employer] that Prior Authorization for Prolia  has been APPROVED from 01/18/23 to 01/17/24   PA #/Case ID/Reference #: [id number]

## 2023-01-25 ENCOUNTER — Telehealth: Payer: Self-pay

## 2023-01-25 NOTE — Telephone Encounter (Signed)
Patient is already scheduled for injection.   Copied from CRM [id number]. Topic: General - Other >> Jan 25, 2023  3:26 PM Elizebeth Brooking wrote: Reason for CRM: Patient called in wanting to get reschedule for the Prolia shot missed call, and is requesting a callback

## 2023-01-26 ENCOUNTER — Other Ambulatory Visit (HOSPITAL_COMMUNITY): Payer: Self-pay

## 2023-01-26 DIAGNOSIS — M47816 Spondylosis without myelopathy or radiculopathy, lumbar region: Secondary | ICD-10-CM | POA: Diagnosis not present

## 2023-01-26 DIAGNOSIS — Z79891 Long term (current) use of opiate analgesic: Secondary | ICD-10-CM | POA: Diagnosis not present

## 2023-01-26 DIAGNOSIS — M51369 Other intervertebral disc degeneration, lumbar region without mention of lumbar back pain or lower extremity pain: Secondary | ICD-10-CM | POA: Diagnosis not present

## 2023-01-26 DIAGNOSIS — G894 Chronic pain syndrome: Secondary | ICD-10-CM | POA: Diagnosis not present

## 2023-01-26 MED ORDER — OXYCODONE-ACETAMINOPHEN 5-325 MG PO TABS
1.0000 | ORAL_TABLET | Freq: Three times a day (TID) | ORAL | 0 refills | Status: DC | PRN
Start: 2023-04-18 — End: 2023-09-01
  Filled 2023-04-20: qty 90, 30d supply, fill #0

## 2023-01-26 MED ORDER — OXYCODONE-ACETAMINOPHEN 5-325 MG PO TABS
1.0000 | ORAL_TABLET | Freq: Three times a day (TID) | ORAL | 0 refills | Status: DC | PRN
Start: 2023-03-19 — End: 2023-04-14
  Filled 2023-03-19: qty 90, 30d supply, fill #0

## 2023-01-26 MED ORDER — OXYCODONE-ACETAMINOPHEN 5-325 MG PO TABS
1.0000 | ORAL_TABLET | Freq: Three times a day (TID) | ORAL | 0 refills | Status: DC | PRN
  Filled 2023-02-18: qty 90, 30d supply, fill #0

## 2023-01-27 DIAGNOSIS — E119 Type 2 diabetes mellitus without complications: Secondary | ICD-10-CM | POA: Diagnosis not present

## 2023-01-27 DIAGNOSIS — H40013 Open angle with borderline findings, low risk, bilateral: Secondary | ICD-10-CM | POA: Diagnosis not present

## 2023-01-27 DIAGNOSIS — H16223 Keratoconjunctivitis sicca, not specified as Sjogren's, bilateral: Secondary | ICD-10-CM | POA: Diagnosis not present

## 2023-01-27 DIAGNOSIS — H43813 Vitreous degeneration, bilateral: Secondary | ICD-10-CM | POA: Diagnosis not present

## 2023-01-27 DIAGNOSIS — H524 Presbyopia: Secondary | ICD-10-CM | POA: Diagnosis not present

## 2023-01-27 DIAGNOSIS — H5213 Myopia, bilateral: Secondary | ICD-10-CM | POA: Diagnosis not present

## 2023-01-27 DIAGNOSIS — H52221 Regular astigmatism, right eye: Secondary | ICD-10-CM | POA: Diagnosis not present

## 2023-01-27 DIAGNOSIS — H25013 Cortical age-related cataract, bilateral: Secondary | ICD-10-CM | POA: Diagnosis not present

## 2023-01-27 DIAGNOSIS — H35033 Hypertensive retinopathy, bilateral: Secondary | ICD-10-CM | POA: Diagnosis not present

## 2023-01-29 ENCOUNTER — Ambulatory Visit: Payer: Commercial Managed Care - PPO

## 2023-01-29 ENCOUNTER — Telehealth: Payer: Self-pay

## 2023-01-29 DIAGNOSIS — M810 Age-related osteoporosis without current pathological fracture: Secondary | ICD-10-CM

## 2023-01-29 MED ORDER — DENOSUMAB 60 MG/ML ~~LOC~~ SOSY
60.0000 mg | PREFILLED_SYRINGE | Freq: Once | SUBCUTANEOUS | Status: AC
Start: 2023-07-29 — End: 2023-08-02
  Administered 2023-08-02: 60 mg via SUBCUTANEOUS

## 2023-01-29 MED ORDER — DENOSUMAB 60 MG/ML ~~LOC~~ SOSY
60.0000 mg | PREFILLED_SYRINGE | Freq: Once | SUBCUTANEOUS | Status: AC
Start: 2023-02-12 — End: 2023-01-29
  Administered 2023-01-29: 60 mg via SUBCUTANEOUS

## 2023-01-29 NOTE — Telephone Encounter (Signed)
Patient is cleared for PROLIA injection on 01/29/2023 per PA information on 01/15/2023.  Mentioned in provider note last on 06/2022. Medication is CLINIC supplied. CoPay:$120

## 2023-01-29 NOTE — Progress Notes (Signed)
After obtaining consent, and per orders of Dr. Yetta Barre, injection of Prolia given by Ferdie Ping. Patient instructed  to report any adverse reaction to me immediately.       Patient is cleared for PROLIA injection on 01/29/2023 per PA information on 01/15/2023.  Mentioned in provider note last on 06/2022. Medication is CLINIC supplied. CoPay:$120

## 2023-02-08 ENCOUNTER — Other Ambulatory Visit (HOSPITAL_COMMUNITY): Payer: Self-pay

## 2023-02-08 MED ORDER — PREDNISOLONE ACETATE 1 % OP SUSP
1.0000 [drp] | Freq: Four times a day (QID) | OPHTHALMIC | 0 refills | Status: AC
  Filled 2023-02-08: qty 10, 25d supply, fill #0

## 2023-02-16 ENCOUNTER — Encounter: Payer: Self-pay | Admitting: Internal Medicine

## 2023-02-16 ENCOUNTER — Ambulatory Visit: Payer: Commercial Managed Care - PPO | Admitting: Internal Medicine

## 2023-02-16 VITALS — BP 142/88 | HR 71 | Temp 98.1°F | Resp 16 | Ht 60.0 in | Wt 147.0 lb

## 2023-02-16 DIAGNOSIS — E1121 Type 2 diabetes mellitus with diabetic nephropathy: Secondary | ICD-10-CM

## 2023-02-16 DIAGNOSIS — E785 Hyperlipidemia, unspecified: Secondary | ICD-10-CM

## 2023-02-16 DIAGNOSIS — I10 Essential (primary) hypertension: Secondary | ICD-10-CM

## 2023-02-16 DIAGNOSIS — N1831 Chronic kidney disease, stage 3a: Secondary | ICD-10-CM

## 2023-02-16 DIAGNOSIS — E559 Vitamin D deficiency, unspecified: Secondary | ICD-10-CM

## 2023-02-16 DIAGNOSIS — M8000XD Age-related osteoporosis with current pathological fracture, unspecified site, subsequent encounter for fracture with routine healing: Secondary | ICD-10-CM | POA: Diagnosis not present

## 2023-02-16 DIAGNOSIS — Z124 Encounter for screening for malignant neoplasm of cervix: Secondary | ICD-10-CM | POA: Insufficient documentation

## 2023-02-16 LAB — CBC WITH DIFFERENTIAL/PLATELET
Basophils Absolute: 0 10*3/uL (ref 0.0–0.1)
Basophils Relative: 0.5 % (ref 0.0–3.0)
Eosinophils Absolute: 0.2 10*3/uL (ref 0.0–0.7)
Eosinophils Relative: 2.9 % (ref 0.0–5.0)
HCT: 42.5 % (ref 36.0–46.0)
Hemoglobin: 13.7 g/dL (ref 12.0–15.0)
Lymphocytes Relative: 24.6 % (ref 12.0–46.0)
Lymphs Abs: 2.1 10*3/uL (ref 0.7–4.0)
MCHC: 32.2 g/dL (ref 30.0–36.0)
MCV: 90.1 fL (ref 78.0–100.0)
Monocytes Absolute: 0.9 10*3/uL (ref 0.1–1.0)
Monocytes Relative: 9.9 % (ref 3.0–12.0)
Neutro Abs: 5.4 10*3/uL (ref 1.4–7.7)
Neutrophils Relative %: 62.1 % (ref 43.0–77.0)
Platelets: 277 10*3/uL (ref 150.0–400.0)
RBC: 4.72 Mil/uL (ref 3.87–5.11)
RDW: 14.6 % (ref 11.5–15.5)
WBC: 8.7 10*3/uL (ref 4.0–10.5)

## 2023-02-16 LAB — PHOSPHORUS: Phosphorus: 3.3 mg/dL (ref 2.3–4.6)

## 2023-02-16 LAB — LIPID PANEL
Cholesterol: 162 mg/dL (ref 0–200)
HDL: 55.4 mg/dL (ref >39.00)
LDL Cholesterol: 55 mg/dL (ref 0–99)
NonHDL: 106.94
Total CHOL/HDL Ratio: 3
Triglycerides: 262 mg/dL — ABNORMAL HIGH (ref 0.0–149.0)
VLDL: 52.4 mg/dL — ABNORMAL HIGH (ref 0.0–40.0)

## 2023-02-16 LAB — BASIC METABOLIC PANEL
BUN: 19 mg/dL (ref 6–23)
CO2: 33 meq/L — ABNORMAL HIGH (ref 19–32)
Calcium: 9.8 mg/dL (ref 8.4–10.5)
Chloride: 103 meq/L (ref 96–112)
Creatinine, Ser: 0.96 mg/dL (ref 0.40–1.20)
GFR: 65.43 mL/min (ref >60.00)
Glucose, Bld: 99 mg/dL (ref 70–99)
Potassium: 4.5 meq/L (ref 3.5–5.1)
Sodium: 142 meq/L (ref 135–145)

## 2023-02-16 LAB — VITAMIN D 25 HYDROXY (VIT D DEFICIENCY, FRACTURES): VITD: 40.77 ng/mL (ref 30.00–100.00)

## 2023-02-16 LAB — TSH: TSH: 1.55 u[IU]/mL (ref 0.35–5.50)

## 2023-02-16 LAB — MAGNESIUM: Magnesium: 2 mg/dL (ref 1.5–2.5)

## 2023-02-16 NOTE — Patient Instructions (Signed)

## 2023-02-16 NOTE — Progress Notes (Unsigned)
Subjective:  Patient ID: Joann Bond, female    DOB: 1965-01-18  Age: 58 y.o. MRN: [id number]  CC: Hypertension, Hyperlipidemia, and Diabetes   HPI [name] presents for f/up -----  Discussed the use of AI scribe software for clinical note transcription with the patient, who gave verbal consent to proceed.  History of Present Illness   Joann Bond is a 58 year old female who presents with a recent episode of vertigo.  Approximately one week ago, she experienced a brief episode of vertigo described as 'the room was spinning' after returning home from her mother's house. Despite the episode, she continued working, although it made her feel nervous. Today is the first day she has not experienced any dizziness or lightheadedness since the episode.  She has no current dizziness, lightheadedness, numbness, weakness, tingling, ear pain, hearing difficulties, or sore throat. She has never experienced vertigo like this before, although she has a history of dizziness related to low blood pressure. During the vertigo episode, her blood pressure was recorded at 99/60 mmHg, which was unusual as she was not feeling dizzy prior to the episode.  She did not stop or change her medications, which include amlodipine, valsartan, and nebivolol, except for a brief two-day discontinuation of amlodipine during the vertigo episode. Her current blood pressure is 110/74 mmHg, which she considers better than during the episode.  She mentions that two people at her workplace have also experienced vertigo, raising her concern about the possibility of viral vertigo.       Outpatient Medications Prior to Visit  Medication Sig Dispense Refill   empagliflozin (JARDIANCE) 10 MG TABS tablet Take 1 tablet (10 mg total) by mouth daily before breakfast. 90 tablet 0   gabapentin (NEURONTIN) 300 MG capsule Take 1 capsule (300 mg total) by mouth 3 (three) times daily. 270 capsule 0   nebivolol (BYSTOLIC) 10 MG tablet Take  1 tablet (10 mg total) by mouth daily. 90 tablet 0   oxyCODONE-acetaminophen (PERCOCET/ROXICET) 5-325 MG tablet Take one tablet by mouth every 8 (eight) hours as needed for Pain for up to 30 days. Max Daily Amount: 3 tablets 90 tablet 0   oxyCODONE-acetaminophen (PERCOCET/ROXICET) 5-325 MG tablet Take one tablet by mouth every 8 (eight) hours as needed for Pain for up to 30 days. Max Daily Amount: 3 tablets per day. 90 tablet 0   oxyCODONE-acetaminophen (PERCOCET/ROXICET) 5-325 MG tablet Take one tablet by mouth every 8 (eight) hours as needed for Pain for up to 30 days. DNF 07/11/22 Max Daily Amount: 3 tablets 90 tablet 0   oxyCODONE-acetaminophen (PERCOCET/ROXICET) 5-325 MG tablet Take one tablet by mouth every 8 (eight) hours as needed for Pain for up to 30 days. Max Daily Amount: 3 tablets 90 tablet 0   oxyCODONE-acetaminophen (PERCOCET/ROXICET) 5-325 MG tablet Take 1 tablet by mouth every 8 (eight) hours as needed for pain. Max 3 tablets. 90 tablet 0   oxyCODONE-acetaminophen (PERCOCET/ROXICET) 5-325 MG tablet Take one tablet by mouth every 8 (eight) hours as needed for Pain for up to 30 days. 90 tablet 0   oxyCODONE-acetaminophen (PERCOCET/ROXICET) 5-325 MG tablet Take one tablet by mouth every 8 (eight) hours as needed for Pain for up to 30 days. 90 tablet 0   oxyCODONE-acetaminophen (PERCOCET/ROXICET) 5-325 MG tablet Take one tablet by mouth every 8 (eight) hours as needed for Pain for up to 30 days. 90 tablet 0   oxyCODONE-acetaminophen (PERCOCET/ROXICET) 5-325 MG tablet Take 1 tablet by mouth every 8 (eight) hours as  needed for pain. 90 tablet 0   [START ON 04/18/2023] oxyCODONE-acetaminophen (PERCOCET/ROXICET) 5-325 MG tablet Take 1 tablet by mouth every 8 (eight) hours as needed for pain. 90 tablet 0   [START ON 03/19/2023] oxyCODONE-acetaminophen (PERCOCET/ROXICET) 5-325 MG tablet Take 1 tablet by mouth every 8 (eight) hours as needed for pain. 90 tablet 0   prednisoLONE acetate (PRED FORTE) 1 %  ophthalmic suspension Place 1 drop into both eyes 4 (four) times daily for 14 days then stop. 10 mL 0   PROLIA 60 MG/ML SOSY injection Inject 60 mg into the skin every 6 (six) months. 1 mL 1   rosuvastatin (CRESTOR) 10 MG tablet Take 1 tablet (10 mg total) by mouth daily. 90 tablet 0   tizanidine (ZANAFLEX) 2 MG capsule Take 1 capsule (2 mg total) by mouth 3 (three) times daily. 270 capsule 0   tizanidine (ZANAFLEX) 2 MG capsule Take one capsule (2 mg dose) by mouth 3 (three) times a day. 270 capsule 0   valsartan (DIOVAN) 80 MG tablet Take 1 tablet (80 mg total) by mouth 2 (two) times daily. 27 tablet 0   amLODipine (NORVASC) 10 MG tablet Take 1 tablet (10 mg total) by mouth daily. 27 tablet 0   gabapentin (NEURONTIN) 300 MG capsule Take one capsule (300 mg dose) by mouth 3 (three) times a day. 270 capsule 0   hydrALAZINE (APRESOLINE) 25 MG tablet Take 1 tablet (25 mg total) by mouth 3 (three) times daily. Follow-up due in June must see provider for future refills (Patient taking differently: Take 25 mg by mouth as needed. Follow-up due in June must see provider for future refills) 270 tablet 0   Magnesium 500 MG TABS Take 1 tablet (500 mg total) by mouth 2 (two) times daily. (Patient not taking: Reported on 09/30/2022) 60 tablet 1   SODIUM FLUORIDE, DENTAL RINSE, (PREVIDENT) 0.2 % SOLN USE AS AN ORAL RINSE, AS DIRECTED ON PACKAGE (Patient not taking: Reported on 09/30/2022) 473 mL 5   Facility-Administered Medications Prior to Visit  Medication Dose Route Frequency Provider Last Rate Last Admin   [START ON 07/29/2023] denosumab (PROLIA) injection 60 mg  60 mg Subcutaneous Once Etta Grandchild, MD        ROS Review of Systems  Constitutional: Negative.  Negative for diaphoresis and fatigue.  HENT: Negative.    Respiratory: Negative.  Negative for cough, chest tightness, shortness of breath and wheezing.   Cardiovascular: Negative.  Negative for chest pain, palpitations and leg swelling.   Gastrointestinal:  Negative for abdominal pain, diarrhea, nausea and vomiting.  Genitourinary:  Negative for difficulty urinating, dysuria and hematuria.  Musculoskeletal:  Positive for arthralgias, back pain and gait problem. Negative for myalgias.  Skin: Negative.   Neurological:  Negative for dizziness, weakness, light-headedness and headaches.  Hematological:  Negative for adenopathy. Does not bruise/bleed easily.  Psychiatric/Behavioral: Negative.      Objective:  BP (!) 142/88 (BP Location: Left Arm, Patient Position: Sitting, Cuff Size: Normal) Comment: BP (R) 142/82 (L) 142/88  Pulse 71   Temp 98.1 F (36.7 C) (Oral)   Resp 16   Ht 5' (1.524 m)   Wt 147 lb (66.7 kg)   SpO2 98%   BMI 28.71 kg/m   BP Readings from Last 3 Encounters:  02/16/23 (!) 142/88  10/01/22 96/62  09/30/22 (!) 82/51    Wt Readings from Last 3 Encounters:  02/16/23 147 lb (66.7 kg)  09/30/22 140 lb (63.5 kg)  06/15/22 143 lb (  64.9 kg)    Physical Exam Vitals reviewed.  Constitutional:      Appearance: Normal appearance.  HENT:     Right Ear: Hearing, tympanic membrane, ear canal and external ear normal.     Left Ear: Hearing, tympanic membrane, ear canal and external ear normal.     Mouth/Throat:     Mouth: Mucous membranes are moist.  Eyes:     General: No scleral icterus.    Conjunctiva/sclera: Conjunctivae normal.  Cardiovascular:     Rate and Rhythm: Normal rate and regular rhythm.     Heart sounds: No murmur heard. Pulmonary:     Effort: Pulmonary effort is normal.     Breath sounds: No stridor. No wheezing, rhonchi or rales.  Abdominal:     General: Abdomen is flat.     Palpations: There is no mass.     Tenderness: There is no abdominal tenderness. There is no guarding.     Hernia: No hernia is present.  Musculoskeletal:        General: Normal range of motion.     Cervical back: Neck supple.     Right lower leg: No edema.     Left lower leg: No edema.  Lymphadenopathy:      Cervical: No cervical adenopathy.  Skin:    General: Skin is warm and dry.  Neurological:     General: No focal deficit present.     Mental Status: She is alert. Mental status is at baseline.  Psychiatric:        Mood and Affect: Mood normal.        Behavior: Behavior normal.     Lab Results  Component Value Date   WBC 8.7 02/16/2023   HGB 13.7 02/16/2023   HCT 42.5 02/16/2023   PLT 277.0 02/16/2023   GLUCOSE 99 02/16/2023   CHOL 162 02/16/2023   TRIG 262.0 (H) 02/16/2023   HDL 55.40 02/16/2023   LDLDIRECT 132.0 11/05/2021   LDLCALC 55 02/16/2023   ALT 20 09/30/2022   AST 19 09/30/2022   NA 142 02/16/2023   K 4.5 02/16/2023   CL 103 02/16/2023   CREATININE 0.96 02/16/2023   BUN 19 02/16/2023   CO2 33 (H) 02/16/2023   TSH 1.55 02/16/2023   HGBA1C 6.4 02/16/2023   MICROALBUR <0.7 11/05/2021    MM 3D SCREENING MAMMOGRAM BILATERAL BREAST Result Date: 12/17/2022 CLINICAL DATA:  Screening. EXAM: DIGITAL SCREENING BILATERAL MAMMOGRAM WITH TOMOSYNTHESIS AND CAD TECHNIQUE: Bilateral screening digital craniocaudal and mediolateral oblique mammograms were obtained. Bilateral screening digital breast tomosynthesis was performed. The images were evaluated with computer-aided detection. COMPARISON:  Previous exam(s). ACR Breast Density Category c: The breasts are heterogeneously dense, which may obscure small masses. FINDINGS: There are no findings suspicious for malignancy. IMPRESSION: No mammographic evidence of malignancy. A result letter of this screening mammogram will be mailed directly to the patient. RECOMMENDATION: Screening mammogram in one year. (Code:SM-B-01Y) BI-RADS CATEGORY  1: Negative. Electronically Signed   By: Frederico Hamman M.D.   On: 12/17/2022 10:44    Assessment & Plan:  Age-related osteoporosis with current pathological fracture with routine healing, subsequent encounter -     Phosphorus; Future -     VITAMIN D 25 Hydroxy (Vit-D Deficiency, Fractures);  Future  Stage 3a chronic kidney disease (HCC)- Will avoid nephrotoxic agents  -     Microalbumin / creatinine urine ratio; Future -     Urinalysis, Routine w reflex microscopic; Future -     CBC with  Differential/Platelet; Future -     Basic metabolic panel; Future -     hydrALAZINE HCl; Take 1 tablet (25 mg total) by mouth 3 (three) times daily. Follow-up due in June must see provider for future refills  Dispense: 270 tablet; Refill: 0  Vitamin D deficiency -     VITAMIN D 25 Hydroxy (Vit-D Deficiency, Fractures); Future  Hypomagnesemia -     Magnesium; Future  Type 2 diabetes mellitus with diabetic nephropathy, without long-term current use of insulin (HCC)- Blood sugar is well controlled. -     Microalbumin / creatinine urine ratio; Future -     Urinalysis, Routine w reflex microscopic; Future -     Hemoglobin A1c; Future -     Basic metabolic panel; Future -     HM Diabetes Foot Exam  Hyperlipidemia with target LDL less than 100 - LDL goal achieved. Doing well on the statin  -     Lipid panel; Future -     TSH; Future  Cervical cancer screening -     Ambulatory referral to Gynecology  Malignant hypertension- Will restart hydralazine. -     amLODIPine Besylate; Take 1 tablet (10 mg total) by mouth daily.  Dispense: 90 tablet; Refill: 1 -     hydrALAZINE HCl; Take 1 tablet (25 mg total) by mouth 3 (three) times daily. Follow-up due in June must see provider for future refills  Dispense: 270 tablet; Refill: 0     Follow-up: Return in about 6 months (around 08/16/2023).  Sanda Linger, MD

## 2023-02-17 ENCOUNTER — Encounter: Payer: Self-pay | Admitting: Internal Medicine

## 2023-02-17 LAB — URINALYSIS, ROUTINE W REFLEX MICROSCOPIC
Bilirubin Urine: NEGATIVE
Hgb urine dipstick: NEGATIVE
Ketones, ur: NEGATIVE
Leukocytes,Ua: NEGATIVE
Nitrite: NEGATIVE
RBC / HPF: NONE SEEN (ref 0–?)
Specific Gravity, Urine: 1.02 (ref 1.000–1.030)
Total Protein, Urine: NEGATIVE
Urine Glucose: >=1000 — AB
Urobilinogen, UA: 0.2 (ref 0.0–1.0)
pH: 6 (ref 5.0–8.0)

## 2023-02-17 LAB — HEMOGLOBIN A1C: Hgb A1c MFr Bld: 6.4 % (ref 4.6–6.5)

## 2023-02-18 ENCOUNTER — Encounter: Payer: Self-pay | Admitting: Internal Medicine

## 2023-02-18 ENCOUNTER — Other Ambulatory Visit (HOSPITAL_COMMUNITY): Payer: Self-pay

## 2023-02-18 ENCOUNTER — Other Ambulatory Visit: Payer: Self-pay

## 2023-02-18 DIAGNOSIS — E1121 Type 2 diabetes mellitus with diabetic nephropathy: Secondary | ICD-10-CM

## 2023-02-18 DIAGNOSIS — E785 Hyperlipidemia, unspecified: Secondary | ICD-10-CM

## 2023-02-18 DIAGNOSIS — N1831 Chronic kidney disease, stage 3a: Secondary | ICD-10-CM

## 2023-02-18 LAB — MICROALBUMIN / CREATININE URINE RATIO
Creatinine,U: 74.5 mg/dL
Microalb Creat Ratio: 9.4 mg/g (ref 0.0–30.0)
Microalb, Ur: <0.7 mg/dL (ref 0.0–1.9)

## 2023-02-18 MED ORDER — VALSARTAN 80 MG PO TABS
80.0000 mg | ORAL_TABLET | Freq: Two times a day (BID) | ORAL | 1 refills | Status: DC
Start: 2023-02-18 — End: 2023-08-18
  Filled 2023-02-18: qty 180, 90d supply, fill #0
  Filled 2023-05-19: qty 180, 90d supply, fill #1

## 2023-02-18 MED ORDER — NEBIVOLOL HCL 10 MG PO TABS
10.0000 mg | ORAL_TABLET | Freq: Every day | ORAL | 1 refills | Status: DC
  Filled 2023-02-18 – 2023-04-19 (×2): qty 90, 90d supply, fill #0
  Filled 2023-07-20: qty 90, 90d supply, fill #1

## 2023-02-18 MED ORDER — AMLODIPINE BESYLATE 10 MG PO TABS
10.0000 mg | ORAL_TABLET | Freq: Every day | ORAL | 1 refills | Status: DC
  Filled 2023-02-18: qty 90, 90d supply, fill #0

## 2023-02-18 MED ORDER — EMPAGLIFLOZIN 10 MG PO TABS
10.0000 mg | ORAL_TABLET | Freq: Every day | ORAL | 1 refills | Status: DC
  Filled 2023-02-18 – 2023-03-30 (×2): qty 90, 90d supply, fill #0
  Filled 2023-07-02: qty 90, 90d supply, fill #1

## 2023-02-18 MED ORDER — ROSUVASTATIN CALCIUM 10 MG PO TABS
10.0000 mg | ORAL_TABLET | Freq: Every day | ORAL | 1 refills | Status: DC
  Filled 2023-02-18 – 2023-03-30 (×2): qty 90, 90d supply, fill #0
  Filled 2023-07-02: qty 90, 90d supply, fill #1

## 2023-02-18 MED ORDER — HYDRALAZINE HCL 25 MG PO TABS
25.0000 mg | ORAL_TABLET | Freq: Three times a day (TID) | ORAL | 0 refills | Status: DC
  Filled 2023-02-18: qty 270, 90d supply, fill #0

## 2023-02-23 ENCOUNTER — Other Ambulatory Visit (HOSPITAL_COMMUNITY): Payer: Self-pay

## 2023-02-23 MED ORDER — GABAPENTIN 300 MG PO CAPS
300.0000 mg | ORAL_CAPSULE | Freq: Three times a day (TID) | ORAL | 1 refills | Status: DC
Start: 2023-02-23 — End: 2023-09-01
  Filled 2023-02-23 – 2023-03-01 (×2): qty 270, 90d supply, fill #0

## 2023-03-01 ENCOUNTER — Other Ambulatory Visit (HOSPITAL_COMMUNITY): Payer: Self-pay

## 2023-03-02 DIAGNOSIS — H5213 Myopia, bilateral: Secondary | ICD-10-CM | POA: Diagnosis not present

## 2023-03-02 DIAGNOSIS — H25013 Cortical age-related cataract, bilateral: Secondary | ICD-10-CM | POA: Diagnosis not present

## 2023-03-02 DIAGNOSIS — H16223 Keratoconjunctivitis sicca, not specified as Sjogren's, bilateral: Secondary | ICD-10-CM | POA: Diagnosis not present

## 2023-03-02 DIAGNOSIS — H524 Presbyopia: Secondary | ICD-10-CM | POA: Diagnosis not present

## 2023-03-02 DIAGNOSIS — H43813 Vitreous degeneration, bilateral: Secondary | ICD-10-CM | POA: Diagnosis not present

## 2023-03-19 ENCOUNTER — Other Ambulatory Visit (HOSPITAL_COMMUNITY): Payer: Self-pay

## 2023-03-30 ENCOUNTER — Other Ambulatory Visit (HOSPITAL_COMMUNITY): Payer: Self-pay

## 2023-04-14 ENCOUNTER — Encounter: Payer: Self-pay | Admitting: Nurse Practitioner

## 2023-04-14 ENCOUNTER — Ambulatory Visit: Payer: Commercial Managed Care - PPO | Admitting: Nurse Practitioner

## 2023-04-14 ENCOUNTER — Other Ambulatory Visit (HOSPITAL_COMMUNITY)
Admission: RE | Admit: 2023-04-14 | Discharge: 2023-04-14 | Disposition: A | Discharge status: Home / Self Care | Source: Ambulatory Visit | Attending: Nurse Practitioner | Admitting: Nurse Practitioner

## 2023-04-14 VITALS — BP 118/70 | HR 72 | Ht 61.0 in | Wt 144.0 lb

## 2023-04-14 DIAGNOSIS — Z780 Asymptomatic menopausal state: Secondary | ICD-10-CM

## 2023-04-14 DIAGNOSIS — M810 Age-related osteoporosis without current pathological fracture: Secondary | ICD-10-CM | POA: Diagnosis not present

## 2023-04-14 DIAGNOSIS — Z1331 Encounter for screening for depression: Secondary | ICD-10-CM | POA: Diagnosis not present

## 2023-04-14 DIAGNOSIS — Z124 Encounter for screening for malignant neoplasm of cervix: Secondary | ICD-10-CM | POA: Diagnosis not present

## 2023-04-14 DIAGNOSIS — Z01419 Encounter for gynecological examination (general) (routine) without abnormal findings: Secondary | ICD-10-CM

## 2023-04-14 NOTE — Progress Notes (Signed)
 Joann Bond October 23, 1965 [id number]   History:  58 y.o. G0 presents for annual exam. Postmenopausal - no HRT, no bleeding. Reports abnormal pap ~5 years ago positive HPV, otherwise normal pap history. HTN and osteoporosis managed by PCP. On Prolia. H/O kidney cancer 2023 managed with nephrectomy. Normal mammogram history.  Gynecologic History No LMP recorded. Patient is postmenopausal.   Sexually active: No  Health maintenance Last Pap: 10/25/2019. Results were: Normal Last mammogram: 12/15/2022. Results were: Normal Last colonoscopy: 01/23/2015. Results were: Normal Last Dexa: 08/26/2022. Results were: T-score -2.7  Flowsheet Row Office Visit from 04/14/2023 in [facility]  PHQ-2 Total Score 0        Past medical history, past surgical history, family history and social history were all reviewed and documented in the EPIC chart. 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 Mother diagnosed with breast cancer at age 58, BRCA negative.  ROS:  A ROS was performed and pertinent positives and negatives are included.  Exam:  Vitals:   04/14/23 1146  BP: 118/70  Pulse: 72  SpO2: 99%  Weight: 144 lb (65.3 kg)  Height: 5\' 1"  (1.549 m)     Body mass index is 27.21 kg/m.  General appearance:  Normal Thyroid:  Symmetrical, normal in size, without palpable masses or nodularity. Respiratory  Auscultation:  Clear without wheezing or rhonchi Cardiovascular  Auscultation:  Regular rate, without rubs, murmurs or gallops  Edema/varicosities:  Not grossly evident Abdominal  Soft,nontender, without masses, guarding or rebound.  Liver/spleen:  No organomegaly noted  Hernia:  None appreciated  Skin  Inspection:  Grossly normal   Breasts: Examined lying and sitting.   Right: Without masses, retractions, discharge or axillary adenopathy.   Left: Without masses, retractions, discharge or axillary adenopathy. Pelvic: External genitalia:  no lesions               Urethra:  normal appearing urethra with no masses, tenderness or lesions              Bartholins and Skenes: normal                 Vagina: normal appearing vagina with normal color and discharge, no lesions. Atrophic changes              Cervix: no lesions Bimanual Exam:  Uterus:  no masses or tenderness              Adnexa: no mass, fullness, tenderness              Rectovaginal: Deferred              Anus:  normal, no lesions  Patient informed chaperone available to be present for breast and pelvic exam. Patient has requested no chaperone to be present. Patient has been advised what will be completed during breast and pelvic exam.   Assessment/Plan:  58 y.o. G0 for annual exam.   Well female exam with routine gynecological exam - Education provided on SBEs, importance of preventative screenings, current guidelines, high calcium diet, regular exercise, and multivitamin daily. Labs with PCP.   Postmenopausal - no HRT, no bleeding.   Age-related osteoporosis without current pathological fracture - 08/2022 T-score -2.7. On Prolia. Managed by PCP.   Cervical cancer screening - Plan: Cytology - PAP( [place]). Positive HPV ~5 years ago, otherwise normal pap history. Pap today per guidelines.   Screening for breast cancer - Normal mammogram history.  Continue annual screenings.  Normal breast  exam today. Mother with history of breast cancer at age 70, BRCA negative.   Screening for colon cancer - 2017 colonoscopy. Will repeat at GI's recommended interval.   Return in about 1 year (around 04/13/2024) for Annual.     Joann Bond, 12:04 PM 04/14/2023

## 2023-04-19 ENCOUNTER — Other Ambulatory Visit (HOSPITAL_COMMUNITY): Payer: Self-pay

## 2023-04-19 LAB — CYTOLOGY - PAP
Comment: NEGATIVE
Diagnosis: NEGATIVE
High risk HPV: NEGATIVE

## 2023-04-20 ENCOUNTER — Encounter: Payer: Self-pay | Admitting: Nurse Practitioner

## 2023-04-20 ENCOUNTER — Other Ambulatory Visit (HOSPITAL_COMMUNITY): Payer: Self-pay

## 2023-04-20 MED ORDER — OXYCODONE-ACETAMINOPHEN 5-325 MG PO TABS
1.0000 | ORAL_TABLET | Freq: Three times a day (TID) | ORAL | 0 refills | Status: DC | PRN
  Filled 2023-04-20 – 2023-07-21 (×2): qty 90, 30d supply, fill #0

## 2023-04-27 ENCOUNTER — Other Ambulatory Visit (HOSPITAL_COMMUNITY): Payer: Self-pay

## 2023-04-27 DIAGNOSIS — I129 Hypertensive chronic kidney disease with stage 1 through stage 4 chronic kidney disease, or unspecified chronic kidney disease: Secondary | ICD-10-CM | POA: Diagnosis not present

## 2023-04-27 DIAGNOSIS — N1831 Chronic kidney disease, stage 3a: Secondary | ICD-10-CM | POA: Diagnosis not present

## 2023-04-27 MED ORDER — AMLODIPINE BESYLATE 5 MG PO TABS
7.5000 mg | ORAL_TABLET | Freq: Every day | ORAL | 6 refills | Status: DC
  Filled 2023-04-27: qty 50, 33d supply, fill #0
  Filled 2023-06-21: qty 50, 33d supply, fill #1
  Filled 2023-07-20: qty 50, 33d supply, fill #2
  Filled 2023-09-14: qty 50, 33d supply, fill #3
  Filled 2023-10-18: qty 50, 33d supply, fill #4
  Filled 2023-11-19: qty 50, 33d supply, fill #5

## 2023-04-27 MED ORDER — OXYCODONE-ACETAMINOPHEN 5-325 MG PO TABS
1.0000 | ORAL_TABLET | Freq: Three times a day (TID) | ORAL | 0 refills | Status: DC | PRN
  Filled 2023-05-21: qty 90, 30d supply, fill #0

## 2023-04-27 MED ORDER — OXYCODONE-ACETAMINOPHEN 5-325 MG PO TABS
1.0000 | ORAL_TABLET | Freq: Three times a day (TID) | ORAL | 0 refills | Status: DC | PRN
  Filled 2023-06-21: qty 90, 30d supply, fill #0

## 2023-04-27 MED ORDER — OXYCODONE-ACETAMINOPHEN 5-325 MG PO TABS
1.0000 | ORAL_TABLET | Freq: Three times a day (TID) | ORAL | 0 refills | Status: DC | PRN
  Filled 2023-08-20: qty 90, 30d supply, fill #0

## 2023-05-21 ENCOUNTER — Other Ambulatory Visit (HOSPITAL_COMMUNITY): Payer: Self-pay

## 2023-05-26 ENCOUNTER — Other Ambulatory Visit (HOSPITAL_COMMUNITY): Payer: Self-pay

## 2023-05-26 ENCOUNTER — Other Ambulatory Visit: Payer: Self-pay

## 2023-05-26 MED ORDER — TIZANIDINE HCL 2 MG PO CAPS
2.0000 mg | ORAL_CAPSULE | Freq: Three times a day (TID) | ORAL | 0 refills | Status: DC
  Filled 2023-05-26: qty 120, 40d supply, fill #0
  Filled 2023-05-26: qty 150, 50d supply, fill #0
  Filled 2023-05-26: qty 270, 90d supply, fill #0

## 2023-05-26 MED ORDER — GABAPENTIN 300 MG PO CAPS
300.0000 mg | ORAL_CAPSULE | Freq: Three times a day (TID) | ORAL | 0 refills | Status: DC
  Filled 2023-05-28: qty 270, 90d supply, fill #0

## 2023-05-28 ENCOUNTER — Other Ambulatory Visit: Payer: Self-pay

## 2023-05-28 ENCOUNTER — Other Ambulatory Visit (HOSPITAL_COMMUNITY): Payer: Self-pay

## 2023-06-21 ENCOUNTER — Other Ambulatory Visit: Payer: Self-pay

## 2023-06-21 ENCOUNTER — Other Ambulatory Visit (HOSPITAL_COMMUNITY): Payer: Self-pay

## 2023-06-29 ENCOUNTER — Encounter: Payer: Self-pay | Admitting: Internal Medicine

## 2023-06-29 ENCOUNTER — Other Ambulatory Visit: Payer: Self-pay | Admitting: Internal Medicine

## 2023-06-29 ENCOUNTER — Telehealth: Payer: Self-pay

## 2023-06-29 ENCOUNTER — Other Ambulatory Visit: Payer: Self-pay

## 2023-06-29 ENCOUNTER — Other Ambulatory Visit (HOSPITAL_COMMUNITY): Payer: Self-pay

## 2023-06-29 DIAGNOSIS — M810 Age-related osteoporosis without current pathological fracture: Secondary | ICD-10-CM

## 2023-06-29 NOTE — Telephone Encounter (Signed)
 PHARMACY PA   PA submitted via CMM. Key: [id number]

## 2023-06-29 NOTE — Telephone Encounter (Signed)
 Prolia  VOB initiated via [email or web address]  Next Prolia  inj DUE: 07/29/23

## 2023-06-29 NOTE — Telephone Encounter (Signed)
 MEDICAL PA

## 2023-06-29 NOTE — Telephone Encounter (Signed)
 SABRA

## 2023-06-29 NOTE — Progress Notes (Signed)
 Specialty Pharmacy Refill Coordination Note  Kandi Brusseau is a 58 y.o. female contacted today regarding refills of specialty medication(s) Denosumab  (PROLIA )   Patient requested Courier to Provider Office   Delivery date: 07/13/23   Verified address: [facility] [address] [place] [address]   Medication will be filled on 07/12/23. This fill date is pending response to refill request from provider. Patient is aware and if they have not received fill by intended date they must follow up with pharmacy.   Will need rewrite.

## 2023-07-02 ENCOUNTER — Other Ambulatory Visit: Payer: Self-pay

## 2023-07-02 ENCOUNTER — Other Ambulatory Visit (HOSPITAL_COMMUNITY): Payer: Self-pay

## 2023-07-02 NOTE — Telephone Encounter (Signed)
 Please send in new prescription to pharmacy. Per pharmacy, their refill request was denied. Pharmacy has Prolia  Copay Card.

## 2023-07-02 NOTE — Telephone Encounter (Signed)
 Pt ready for scheduling for PROLIA  on or after : 07/29/23  Option# 1: Buy/Bill (Office supplied medication)  Out-of-pocket cost due at time of clinic visit: $120  Number of injection/visits approved: 2  Primary: [employer] Prolia  co-insurance: $60 Admin fee co-insurance: $60  Secondary: --- Prolia  co-insurance:  Admin fee co-insurance:   Medical Benefit Details: Date Benefits were checked: 06/29/23 Deductible: NO/ Coinsurance: $60/ Admin Fee: $60  Prior Auth: APPROVED PA# [id number] Expiration Date: 01/18/23-01/17/24  # of doses approved: 2 ----------------------------------------------------------------------- Option# 2- Med Obtained from pharmacy:  Pharmacy benefit: Copay $750 (Paid to pharmacy) Admin Fee: $60 (Pay at clinic)  Prior Auth: APPROVED PA# [id number]  Expiration Date: 07/01/23-06/29/24  # of doses approved: 2   If patient wants fill through the pharmacy benefit please send prescription to: [employer], and include estimated need by date in rx notes. Pharmacy will ship medication directly to the office.  Patient IS eligible for Prolia  Copay Card. Copay Card can make patient's cost as little as $25. Link to apply: [email or web address]://[email or web address]  ** This summary of benefits is an estimation of the patient's out-of-pocket cost. Exact cost may very based on individual plan coverage.

## 2023-07-02 NOTE — Telephone Encounter (Signed)
 It's still saying she's going to need a PA is that correct before I send it

## 2023-07-02 NOTE — Telephone Encounter (Signed)
 Pharmacy Patient Advocate Encounter  Received notification from [employer] that Prior Authorization for PROLIA  has been APPROVED from 07/01/23 to 06/29/24. Ran test claim, Copay is $750. This test claim was processed through [facility]- copay amounts may vary at other pharmacies due to pharmacy/plan contracts, or as the patient moves through the different stages of their insurance plan.   PA #/Case ID/Reference #: [id number]

## 2023-07-05 ENCOUNTER — Other Ambulatory Visit: Payer: Self-pay

## 2023-07-05 DIAGNOSIS — M8000XD Age-related osteoporosis with current pathological fracture, unspecified site, subsequent encounter for fracture with routine healing: Secondary | ICD-10-CM

## 2023-07-05 MED ORDER — DENOSUMAB 60 MG/ML ~~LOC~~ SOSY: 60.0000 mg | PREFILLED_SYRINGE | SUBCUTANEOUS | Status: DC

## 2023-07-05 NOTE — Telephone Encounter (Signed)
 New script has been sent in

## 2023-07-21 ENCOUNTER — Other Ambulatory Visit (HOSPITAL_COMMUNITY): Payer: Self-pay

## 2023-08-02 ENCOUNTER — Ambulatory Visit (INDEPENDENT_AMBULATORY_CARE_PROVIDER_SITE_OTHER)

## 2023-08-02 DIAGNOSIS — M810 Age-related osteoporosis without current pathological fracture: Secondary | ICD-10-CM

## 2023-08-02 NOTE — Progress Notes (Signed)
 Patient visits today for their prolia injection. Patient informed of what they had received and tolerated injection well. Patient notified to reach out to office if needed.

## 2023-08-10 ENCOUNTER — Ambulatory Visit (HOSPITAL_COMMUNITY)
Admission: RE | Admit: 2023-08-10 | Discharge: 2023-08-10 | Disposition: A | Discharge status: Home / Self Care | Source: Ambulatory Visit | Attending: Urology | Admitting: Urology

## 2023-08-10 ENCOUNTER — Other Ambulatory Visit (HOSPITAL_COMMUNITY): Payer: Self-pay | Admitting: Urology

## 2023-08-10 DIAGNOSIS — I82220 Acute embolism and thrombosis of inferior vena cava: Secondary | ICD-10-CM | POA: Diagnosis not present

## 2023-08-10 DIAGNOSIS — D49511 Neoplasm of unspecified behavior of right kidney: Secondary | ICD-10-CM | POA: Insufficient documentation

## 2023-08-10 DIAGNOSIS — Z981 Arthrodesis status: Secondary | ICD-10-CM | POA: Diagnosis not present

## 2023-08-16 DIAGNOSIS — Z905 Acquired absence of kidney: Secondary | ICD-10-CM | POA: Diagnosis not present

## 2023-08-16 DIAGNOSIS — C641 Malignant neoplasm of right kidney, except renal pelvis: Secondary | ICD-10-CM | POA: Diagnosis not present

## 2023-08-16 DIAGNOSIS — K5730 Diverticulosis of large intestine without perforation or abscess without bleeding: Secondary | ICD-10-CM | POA: Diagnosis not present

## 2023-08-16 DIAGNOSIS — D49511 Neoplasm of unspecified behavior of right kidney: Secondary | ICD-10-CM | POA: Diagnosis not present

## 2023-08-18 ENCOUNTER — Other Ambulatory Visit (HOSPITAL_COMMUNITY): Payer: Self-pay

## 2023-08-18 ENCOUNTER — Encounter: Payer: Self-pay | Admitting: Internal Medicine

## 2023-08-18 ENCOUNTER — Other Ambulatory Visit: Payer: Self-pay

## 2023-08-18 DIAGNOSIS — Z79891 Long term (current) use of opiate analgesic: Secondary | ICD-10-CM | POA: Diagnosis not present

## 2023-08-18 MED ORDER — OXYCODONE-ACETAMINOPHEN 5-325 MG PO TABS
1.0000 | ORAL_TABLET | Freq: Three times a day (TID) | ORAL | 0 refills | Status: AC | PRN
  Filled 2023-09-20: qty 90, 30d supply, fill #0

## 2023-08-18 MED ORDER — VALSARTAN 80 MG PO TABS
80.0000 mg | ORAL_TABLET | Freq: Two times a day (BID) | ORAL | 0 refills | Status: DC
  Filled 2023-08-18: qty 180, 90d supply, fill #0

## 2023-08-18 MED ORDER — OXYCODONE-ACETAMINOPHEN 5-325 MG PO TABS
1.0000 | ORAL_TABLET | Freq: Three times a day (TID) | ORAL | 0 refills | Status: AC | PRN
  Filled 2023-10-20: qty 90, 30d supply, fill #0

## 2023-08-18 MED ORDER — GABAPENTIN 300 MG PO CAPS
300.0000 mg | ORAL_CAPSULE | Freq: Three times a day (TID) | ORAL | 1 refills | Status: AC
  Filled 2023-08-18 – 2023-08-24 (×3): qty 270, 90d supply, fill #0
  Filled ????-??-??: fill #0

## 2023-08-18 MED ORDER — OXYCODONE-ACETAMINOPHEN 5-325 MG PO TABS: 1.0000 | ORAL_TABLET | Freq: Three times a day (TID) | ORAL | 0 refills | Status: AC | PRN

## 2023-08-18 MED ORDER — TIZANIDINE HCL 2 MG PO CAPS
2.0000 mg | ORAL_CAPSULE | Freq: Three times a day (TID) | ORAL | 1 refills | Status: AC
  Filled 2023-08-18 – 2023-08-19 (×3): qty 270, 90d supply, fill #0

## 2023-08-19 ENCOUNTER — Other Ambulatory Visit (HOSPITAL_COMMUNITY): Payer: Self-pay

## 2023-08-20 ENCOUNTER — Other Ambulatory Visit (HOSPITAL_COMMUNITY): Payer: Self-pay

## 2023-08-24 ENCOUNTER — Other Ambulatory Visit (HOSPITAL_COMMUNITY): Payer: Self-pay

## 2023-08-31 ENCOUNTER — Encounter: Payer: Self-pay | Admitting: Urology

## 2023-08-31 DIAGNOSIS — N189 Chronic kidney disease, unspecified: Secondary | ICD-10-CM | POA: Diagnosis not present

## 2023-08-31 DIAGNOSIS — D49511 Neoplasm of unspecified behavior of right kidney: Secondary | ICD-10-CM | POA: Diagnosis not present

## 2023-09-01 ENCOUNTER — Encounter: Payer: Self-pay | Admitting: Internal Medicine

## 2023-09-01 ENCOUNTER — Other Ambulatory Visit (HOSPITAL_COMMUNITY): Payer: Self-pay

## 2023-09-01 ENCOUNTER — Ambulatory Visit: Admitting: Internal Medicine

## 2023-09-01 VITALS — BP 132/86 | HR 63 | Temp 97.6°F | Resp 16 | Ht 61.0 in | Wt 147.2 lb

## 2023-09-01 DIAGNOSIS — I10 Essential (primary) hypertension: Secondary | ICD-10-CM | POA: Diagnosis not present

## 2023-09-01 DIAGNOSIS — R1314 Dysphagia, pharyngoesophageal phase: Secondary | ICD-10-CM

## 2023-09-01 DIAGNOSIS — E041 Nontoxic single thyroid nodule: Secondary | ICD-10-CM

## 2023-09-01 DIAGNOSIS — E1121 Type 2 diabetes mellitus with diabetic nephropathy: Secondary | ICD-10-CM

## 2023-09-01 DIAGNOSIS — M8000XD Age-related osteoporosis with current pathological fracture, unspecified site, subsequent encounter for fracture with routine healing: Secondary | ICD-10-CM

## 2023-09-01 DIAGNOSIS — R9431 Abnormal electrocardiogram [ECG] [EKG]: Secondary | ICD-10-CM | POA: Diagnosis not present

## 2023-09-01 DIAGNOSIS — E785 Hyperlipidemia, unspecified: Secondary | ICD-10-CM

## 2023-09-01 LAB — BASIC METABOLIC PANEL WITH GFR
BUN: 24 mg/dL — ABNORMAL HIGH (ref 6–23)
CO2: 27 meq/L (ref 19–32)
Calcium: 9.6 mg/dL (ref 8.4–10.5)
Chloride: 101 meq/L (ref 96–112)
Creatinine, Ser: 1 mg/dL (ref 0.40–1.20)
GFR: 62.07 mL/min (ref >60.00)
Glucose, Bld: 110 mg/dL — ABNORMAL HIGH (ref 70–99)
Potassium: 4.9 meq/L (ref 3.5–5.1)
Sodium: 140 meq/L (ref 135–145)

## 2023-09-01 LAB — MAGNESIUM: Magnesium: 2.2 mg/dL (ref 1.5–2.5)

## 2023-09-01 LAB — HEMOGLOBIN A1C: Hgb A1c MFr Bld: 7.1 % — ABNORMAL HIGH (ref 4.6–6.5)

## 2023-09-01 LAB — HEPATIC FUNCTION PANEL
ALT: 17 U/L (ref 0–35)
AST: 15 U/L (ref 0–37)
Albumin: 4.8 g/dL (ref 3.5–5.2)
Alkaline Phosphatase: 61 U/L (ref 39–117)
Bilirubin, Direct: 0.1 mg/dL (ref 0.0–0.3)
Total Bilirubin: 0.5 mg/dL (ref 0.2–1.2)
Total Protein: 8.1 g/dL (ref 6.0–8.3)

## 2023-09-01 LAB — PHOSPHORUS: Phosphorus: 4.3 mg/dL (ref 2.3–4.6)

## 2023-09-01 LAB — TSH: TSH: 1.35 u[IU]/mL (ref 0.35–5.50)

## 2023-09-01 LAB — TROPONIN I (HIGH SENSITIVITY): High Sens Troponin I: <2 ng/L (ref 2–17)

## 2023-09-01 MED ORDER — ROSUVASTATIN CALCIUM 10 MG PO TABS
10.0000 mg | ORAL_TABLET | Freq: Every day | ORAL | 1 refills | Status: AC
  Filled 2023-09-01 – 2023-10-05 (×2): qty 90, 90d supply, fill #0
  Filled 2024-01-06: qty 90, 90d supply, fill #1
  Filled 2024-01-07: qty 90, 90d supply, fill #0

## 2023-09-01 NOTE — Progress Notes (Signed)
 Subjective:  Patient ID: Joann Bond, female    DOB: [birth year]  Age: 58 y.o. MRN: [id number]  CC: Hypertension and Diabetes   HPI [name] presents for f/up ----  Discussed the use of AI scribe software for clinical note transcription with the patient, who gave verbal consent to proceed.  History of Present Illness Joann Bond is a 58 year old female who presents with thyroid  nodules and cysts.  She recently had a follow-up with her urologist after kidney cancer treatment, which showed no recurrence. During this visit, thyroid  nodules and cysts were discovered. She experiences pain in the thyroid  area, especially when swallowing or upon touch, but this pain has since subsided. She has not been on thyroid  medication and has never had a thyroid  ultrasound. Her thyroid  function tests have always been normal.  She has a long-standing issue with swallowing, where food sometimes feels like it gets stuck, although a previous swallowing test indicated no actual obstruction. She underwent a scope examination many years ago due to a bleeding ulcer, but no recent evaluations have been conducted.  Her blood pressure has been low, and her Norvasc  dose was reduced. Despite this, she has no weakness, dizziness, or lightheadedness. She also has no heartburn, indigestion, or abdominal pain.  Her kidney doctor recently confirmed good kidney function. She avoids taking Motrin  or aspirin due to her kidney condition, only using them occasionally.     Outpatient Medications Prior to Visit  Medication Sig Dispense Refill   amLODipine  (NORVASC ) 5 MG tablet Take 1.5 tablets (7.5 mg total) by mouth daily. 50 tablet 6   empagliflozin  (JARDIANCE ) 10 MG TABS tablet Take 1 tablet (10 mg total) by mouth daily before breakfast. 90 tablet 1   gabapentin  (NEURONTIN ) 300 MG capsule Take 1 capsule (300 mg total) by mouth 3 (three) times daily. 270 capsule 1   nebivolol  (BYSTOLIC ) 10 MG tablet Take 1 tablet (10 mg  total) by mouth daily. 90 tablet 1   [START ON 09/17/2023] oxyCODONE -acetaminophen  (PERCOCET/ROXICET) 5-325 MG tablet Take one tablet by mouth every 8 (eight) hours as needed for Pain for up to 30 days. Max Daily Amount: 3 tablets. 09/17/23) 90 tablet 0   PROLIA  60 MG/ML SOSY injection Inject 60 mg into the skin every 6 (six) months. 1 mL 1   tizanidine  (ZANAFLEX ) 2 MG capsule Take 1 capsule (2 mg total) by mouth 3 (three) times daily. 270 capsule 1   valsartan  (DIOVAN ) 80 MG tablet Take 1 tablet (80 mg total) by mouth 2 (two) times daily. 180 tablet 0   amLODipine  (NORVASC ) 10 MG tablet Take 1 tablet (10 mg total) by mouth daily. 90 tablet 1   gabapentin  (NEURONTIN ) 300 MG capsule Take 1 capsule (300 mg total) by mouth 3 (three) times daily. 270 capsule 1   gabapentin  (NEURONTIN ) 300 MG capsule Take 1 capsule (300 mg total) by mouth 3 (three) times daily. 270 capsule 0   oxyCODONE -acetaminophen  (PERCOCET/ROXICET) 5-325 MG tablet Take one tablet by mouth every 8 (eight) hours as needed for Pain for up to 30 days. Max Daily Amount: 3 tablets 90 tablet 0   oxyCODONE -acetaminophen  (PERCOCET/ROXICET) 5-325 MG tablet Take one tablet by mouth every 8 (eight) hours as needed for Pain for up to 30 days. Max Daily Amount: 3 tablets per day. 90 tablet 0   oxyCODONE -acetaminophen  (PERCOCET/ROXICET) 5-325 MG tablet Take 1 tablet by mouth every 8 (eight) hours as needed for pain. 90 tablet 0   oxyCODONE -acetaminophen  (PERCOCET/ROXICET) 5-325 MG tablet  Take 1 tablet by mouth every 8 (eight) hours as needed for pain for up to 30 days. Max Daily Amount: 3 tablets. 90 tablet 0   oxyCODONE -acetaminophen  (PERCOCET/ROXICET) 5-325 MG tablet Take 1 tablet by mouth every 8 (eight) hours as needed for pain. Must last 30 days. (05/20/23) 90 tablet 0   oxyCODONE -acetaminophen  (PERCOCET/ROXICET) 5-325 MG tablet Take 1 tablet by mouth every 8 (eight) hours as needed for pain. Must last 30 days.. 90 tablet 0    oxyCODONE -acetaminophen  (PERCOCET/ROXICET) 5-325 MG tablet Take 1 tablet by mouth every 8 (eight) hours as needed for pain. Must last 30 days. (06/19/23) 90 tablet 0   oxyCODONE -acetaminophen  (PERCOCET/ROXICET) 5-325 MG tablet Take 1 tablet by mouth every 8 (eight) hours as needed for pain for up to 30 days. Max Daily Amount: 3 tablets. 90 tablet 0   [START ON 10/19/2023] oxyCODONE -acetaminophen  (PERCOCET/ROXICET) 5-325 MG tablet Take one tablet by mouth every 8 (eight) hours as needed for Pain for up to 30 days. Max Daily Amount: 3 tablets (10/19/23) 90 tablet 0   rosuvastatin  (CRESTOR ) 10 MG tablet Take 1 tablet (10 mg total) by mouth daily. 90 tablet 1   tizanidine  (ZANAFLEX ) 2 MG capsule Take one capsule (2 mg dose) by mouth 3 (three) times a day. 270 capsule 0   denosumab  (PROLIA ) injection 60 mg      No facility-administered medications prior to visit.    ROS Review of Systems  Constitutional:  Negative for appetite change, chills, diaphoresis, fatigue and fever.  HENT:  Positive for trouble swallowing. Negative for sore throat and voice change.   Eyes: Negative.   Respiratory: Negative.  Negative for cough, chest tightness, shortness of breath and wheezing.   Cardiovascular:  Negative for chest pain, palpitations and leg swelling.  Gastrointestinal:  Negative for abdominal pain, blood in stool, constipation, diarrhea and vomiting.  Endocrine: Negative.   Genitourinary:  Negative for difficulty urinating.  Musculoskeletal:  Positive for arthralgias. Negative for joint swelling and myalgias.  Skin: Negative.   Neurological: Negative.  Negative for dizziness and weakness.  Hematological:  Negative for adenopathy. Does not bruise/bleed easily.  Psychiatric/Behavioral: Negative.      Objective:  BP 132/86 (BP Location: Left Arm, Patient Position: Sitting, Cuff Size: Small)   Pulse 63   Temp 97.6 F (36.4 C) (Oral)   Resp 16   Ht 5' 1 (1.549 m)   Wt 147 lb 3.2 oz (66.8 kg)   SpO2  97%   BMI 27.81 kg/m   BP Readings from Last 3 Encounters:  09/01/23 132/86  04/14/23 118/70  02/16/23 (!) 142/88    Wt Readings from Last 3 Encounters:  09/01/23 147 lb 3.2 oz (66.8 kg)  04/14/23 144 lb (65.3 kg)  02/16/23 147 lb (66.7 kg)    Physical Exam Vitals reviewed.  Constitutional:      Appearance: Normal appearance.  HENT:     Nose: Nose normal.     Mouth/Throat:     Mouth: Mucous membranes are moist.  Eyes:     General: No scleral icterus.    Conjunctiva/sclera: Conjunctivae normal.  Neck:     Thyroid : Thyroid  mass and thyromegaly present. No thyroid  tenderness.     Comments: Right T nodule Cardiovascular:     Rate and Rhythm: Normal rate and regular rhythm.     Heart sounds: Normal heart sounds, S1 normal and S2 normal. No murmur heard.    No friction rub. No gallop.     Comments: EKG-- NSR, 66  bpm LAE ST/T wave changes (new), ?artifact No LVH or Q waves Pulmonary:     Effort: Pulmonary effort is normal.     Breath sounds: No stridor. No wheezing, rhonchi or rales.  Abdominal:     General: Abdomen is flat.     Palpations: There is no mass.     Tenderness: There is no abdominal tenderness. There is no guarding.     Hernia: No hernia is present.  Musculoskeletal:     Cervical back: Neck supple.     Right lower leg: No edema.     Left lower leg: No edema.  Lymphadenopathy:     Cervical: No cervical adenopathy.  Skin:    General: Skin is warm and dry.     Coloration: Skin is not jaundiced.     Findings: No rash.  Neurological:     General: No focal deficit present.     Mental Status: She is alert. Mental status is at baseline.  Psychiatric:        Mood and Affect: Mood normal.        Behavior: Behavior normal.     Lab Results  Component Value Date   WBC 8.7 02/16/2023   HGB 13.7 02/16/2023   HCT 42.5 02/16/2023   PLT 277.0 02/16/2023   GLUCOSE 110 (H) 09/01/2023   CHOL 162 02/16/2023   TRIG 262.0 (H) 02/16/2023   HDL 55.40 02/16/2023    LDLDIRECT 132.0 11/05/2021   LDLCALC 55 02/16/2023   ALT 17 09/01/2023   AST 15 09/01/2023   NA 140 09/01/2023   K 4.9 09/01/2023   CL 101 09/01/2023   CREATININE 1.00 09/01/2023   BUN 24 (H) 09/01/2023   CO2 27 09/01/2023   TSH 1.35 09/01/2023   HGBA1C 7.1 (H) 09/01/2023   MICROALBUR <0.7 02/16/2023    DG Chest 2 View Result Date: 08/18/2023 CLINICAL DATA:  Right renal neoplasm.  No current chest complaints. EXAM: CHEST - 2 VIEW COMPARISON:  09/30/2022. FINDINGS: Cardiac silhouette is mildly enlarged. No mediastinal or hilar masses. No evidence of adenopathy. Lungs demonstrate chronic mild prominence of the interstitial markings. Lungs are otherwise clear. No pleural effusion or pneumothorax. Previous mid thoracic to lumbar spine fusion, incompletely imaged but stable. No acute skeletal abnormality. No bone lesion. IMPRESSION: No active cardiopulmonary disease. Electronically Signed   By: Alm Parkins M.D.   On: 08/18/2023 08:31    US  THYROID  Result Date: 09/09/2023 CLINICAL DATA:  Palpable abnormality. Right-sided thyroid  nodule on physical exam EXAM: THYROID  ULTRASOUND TECHNIQUE: Ultrasound examination of the thyroid  gland and adjacent soft tissues was performed. COMPARISON:  None Available. FINDINGS: Parenchymal Echotexture: Mildly heterogenous Isthmus: 0.6 cm Right lobe: 4.9 x 2.5 x 2.7 cm Left lobe: 5.0 x 1.9 x 2.1 cm _________________________________________________________ Estimated total number of nodules >/= 1 cm: 3 Number of spongiform nodules >/=  2 cm not described below (TR1): 0 Number of mixed cystic and solid nodules >/= 1.5 cm not described below (TR2): 0 _________________________________________________________ Nodule # 1: Large predominantly anechoic colloid cyst with some peripheral colloid artifact in the right mid gland measures 2.8 x 2.2 x 2.6 cm. Findings are consistent with a benign colloid nodule. This nodule does NOT meet TI-RADS criteria for biopsy or dedicated  follow-up. Nodule # 3: Predominantly solid isoechoic nodule in the left mid gland measures 3.3 x 1.5 x 3.0 cm. Findings are consistent with TI-RADS category 3. **Given size (>/= 2.5 cm) and appearance, fine needle aspiration of this mildly suspicious nodule should  be considered based on TI-RADS criteria. Nodule # 4: Hypoechoic solid nodule in the left lower gland measures up to 1.2 cm. TI-RADS category 3. Given size (<1.4 cm) and appearance, this nodule does NOT meet TI-RADS criteria for biopsy or dedicated follow-up. IMPRESSION: 1. Heterogeneous and multinodular thyroid  gland most consistent with multinodular goiter. 2. Nodule # 3 is a 3.3 cm TI-RADS category 3 nodule in the left mid gland meets criteria to consider fine-needle aspiration biopsy. Biopsy is recommended. 3. Large but sonographically low risk/benign colloid cyst in the right mid gland measures up to 2.8 cm. The above is in keeping with the ACR TI-RADS recommendations - J Am Coll Radiol 2017;14:587-595. Electronically Signed   By: Wilkie Lent M.D.   On: 09/09/2023 10:42   DG Chest 2 View Result Date: 08/18/2023 CLINICAL DATA:  Right renal neoplasm.  No current chest complaints. EXAM: CHEST - 2 VIEW COMPARISON:  09/30/2022. FINDINGS: Cardiac silhouette is mildly enlarged. No mediastinal or hilar masses. No evidence of adenopathy. Lungs demonstrate chronic mild prominence of the interstitial markings. Lungs are otherwise clear. No pleural effusion or pneumothorax. Previous mid thoracic to lumbar spine fusion, incompletely imaged but stable. No acute skeletal abnormality. No bone lesion. IMPRESSION: No active cardiopulmonary disease. Electronically Signed   By: Alm Parkins M.D.   On: 08/18/2023 08:31     Assessment & Plan:  Type 2 diabetes mellitus with diabetic nephropathy, without long-term current use of insulin (HCC) -     Hemoglobin A1c; Future -     Hepatitis B surface antibody,quantitative; Future -     Hepatitis A antibody,  total; Future  Hyperlipidemia with target LDL less than 100 -     Hepatic function panel; Future -     Rosuvastatin  Calcium ; Take 1 tablet (10 mg total) by mouth daily.  Dispense: 90 tablet; Refill: 1  Age-related osteoporosis with current pathological fracture with routine healing, subsequent encounter -     Magnesium ; Future -     Basic metabolic panel with GFR; Future -     Phosphorus; Future  Hypomagnesemia -     Magnesium ; Future  Solitary nodule of right lobe of thyroid  -     US  THYROID ; Future -     TSH; Future -     Thyroid  peroxidase antibody; Future -     Thyroxine binding globulin; Future  Primary hypertension -     Basic metabolic panel with GFR; Future -     EKG 12-Lead  Abnormal EKG -     Troponin I (High Sensitivity); Future -     ECHOCARDIOGRAM COMPLETE; Future  Pharyngoesophageal dysphagia -     Ambulatory referral to Gastroenterology  Left thyroid  nodule -     Ambulatory referral to ENT     Follow-up: Return in about 3 months (around 12/02/2023).  Debby Molt, MD

## 2023-09-01 NOTE — Patient Instructions (Signed)
 Thyroid Nodule  A thyroid nodule is an isolated growth of thyroid cells that forms a lump in the thyroid gland. The thyroid gland is a butterfly-shaped gland found in the lower front of the neck. It sends chemical messengers (hormones) through the blood to all parts of the body. These hormones are important in regulating body temperature and helping the body use energy. Thyroid nodules are common. Most are not cancerous (are benign). You may have one nodule or several nodules. There are different types of thyroid nodules. They include nodules that: Grow and fill with fluid (thyroid cysts). Produce too much thyroid hormone (hot nodules or hyperthyroid). Produce no thyroid hormone (cold nodules or hypothyroid). Form from cancer cells (thyroid cancers). What are the causes? In most cases, the cause of thyroid nodules is not known. What increases the risk? The following factors may make you more likely to develop thyroid nodules: Age. Thyroid nodules are more common in people who are older than 45 years. Female gender. A family history that includes: Thyroid nodules. Pheochromocytoma. Thyroid carcinoma. Hyperparathyroidism. Certain thyroid diseases, such as Hashimoto's thyroiditis. Lack of iodine in your diet. A history of head and neck radiation, such as from cancer treatments. Type 2 diabetes. What are the signs or symptoms? In many cases, there are no symptoms. If you have symptoms, they may include: A lump in your lower neck. Feeling pressure, fullness, or a tickle in your throat. Pain in your neck, jaw, or ear. Having trouble swallowing or breathing. Hot nodules may cause: Weight loss. Warm, flushed skin. Feeling hot. Feeling nervous. A rapid or irregular heartbeat. Cold nodules may cause: Weight gain. Dry skin. Hair loss, brittle hair, or both. Feeling cold. Fatigue. Thyroid cancer nodules may cause: Hard nodules that can be felt along the thyroid  gland. Hoarseness. Lumps in the tissue (lymph nodes) near your thyroid gland. How is this diagnosed? A thyroid nodule may be felt by your health care provider during a physical exam. This condition may also be diagnosed based on your symptoms. You may also have tests, including: Blood tests to check how well your thyroid is working. An ultrasound. This may be done to confirm the diagnosis. A biopsy. This involves taking a sample from the nodule and looking at it under a microscope. A thyroid scan. This test creates an image of the thyroid gland using a radioactive tracer. Imaging tests such as an MRI or CT scan. These may be done if: A nodule is large. A nodule is blocking your airway. Cancer is suspected. How is this treated? Treatment depends on the cause and size of your nodule or nodules. If a nodule is benign, treatment may not be necessary. Your health care provider may monitor the nodule to see if it goes away without treatment. If a nodule continues to grow, is cancerous, or does not go away, treatment may be needed. Treatment may include: Having a cystic nodule drained with a needle. Ablation therapy. In this treatment, alcohol is injected into the area of the nodule to destroy the cells. Ablation with heat may also be used. This is called thermal ablation. Radioactive iodine. In this treatment, radioactive iodine is given as a pill or liquid that you drink. This substance causes the thyroid nodule to shrink. Surgery to remove the nodule or nodules. Part or all of your thyroid gland may also need to be removed. Medicines to treat hyperthyroidism. Follow these instructions at home: Pay attention to any changes in your thyroid nodule or nodules. Take over-the-counter  and prescription medicines only as told by your health care provider. Keep all follow-up visits. This is important. Contact a health care provider if: You have trouble sleeping. You have muscle weakness. You have  significant weight loss without changing your eating habits. You feel nervous. You have trouble swallowing. You have increased swelling. You have a rapid or irregular heartbeat. Get help right away if: You have chest pain. You faint or lose consciousness. Your nodule makes it hard for you to breathe. These symptoms may be an emergency. Get help right away. Call 911. Do not wait to see if the symptoms will go away. Do not drive yourself to the hospital. Summary A thyroid nodule is an isolated growth of thyroid cells that forms a lump in your thyroid gland. Thyroid nodules are common. Most are not cancerous. Your health care provider may monitor the nodule to see if it goes away without treatment. If a nodule continues to grow, is cancerous, or does not go away, treatment may be needed. Treatment depends on the cause and size of your nodule or nodules. This information is not intended to replace advice given to you by your health care provider. Make sure you discuss any questions you have with your health care provider. Document Revised: 11/04/2020 Document Reviewed: 11/04/2020 Elsevier Patient Education  2024 [employer].

## 2023-09-03 ENCOUNTER — Ambulatory Visit
Admission: RE | Admit: 2023-09-03 | Discharge: 2023-09-03 | Disposition: A | Discharge status: Home / Self Care | Source: Ambulatory Visit | Attending: Internal Medicine | Admitting: Internal Medicine

## 2023-09-03 DIAGNOSIS — E042 Nontoxic multinodular goiter: Secondary | ICD-10-CM | POA: Diagnosis not present

## 2023-09-03 DIAGNOSIS — E041 Nontoxic single thyroid nodule: Secondary | ICD-10-CM

## 2023-09-09 LAB — HEPATITIS A ANTIBODY, TOTAL: Hepatitis A AB,Total: REACTIVE — AB

## 2023-09-09 LAB — THYROXINE BINDING GLOBULIN: TBG: 22.2 ug/mL (ref 13.5–30.9)

## 2023-09-09 LAB — HEPATITIS B SURFACE ANTIBODY, QUANTITATIVE: Hep B S AB Quant (Post): 133 m[IU]/mL (ref >=10)

## 2023-09-09 LAB — THYROID PEROXIDASE ANTIBODY: Thyroperoxidase Ab SerPl-aCnc: <1 [IU]/mL (ref <9)

## 2023-09-13 ENCOUNTER — Ambulatory Visit: Payer: Self-pay | Admitting: Internal Medicine

## 2023-09-13 DIAGNOSIS — E041 Nontoxic single thyroid nodule: Secondary | ICD-10-CM | POA: Insufficient documentation

## 2023-09-15 ENCOUNTER — Encounter: Payer: Self-pay | Admitting: Physician Assistant

## 2023-09-20 ENCOUNTER — Other Ambulatory Visit (HOSPITAL_COMMUNITY): Payer: Self-pay

## 2023-09-24 NOTE — Telephone Encounter (Signed)
Can you please assist me with this.

## 2023-10-05 ENCOUNTER — Other Ambulatory Visit: Payer: Self-pay | Admitting: Internal Medicine

## 2023-10-05 ENCOUNTER — Other Ambulatory Visit: Payer: Self-pay

## 2023-10-05 ENCOUNTER — Other Ambulatory Visit (HOSPITAL_COMMUNITY): Payer: Self-pay

## 2023-10-05 ENCOUNTER — Encounter: Payer: Self-pay | Admitting: Internal Medicine

## 2023-10-05 DIAGNOSIS — N1831 Chronic kidney disease, stage 3a: Secondary | ICD-10-CM

## 2023-10-05 DIAGNOSIS — E1121 Type 2 diabetes mellitus with diabetic nephropathy: Secondary | ICD-10-CM

## 2023-10-07 ENCOUNTER — Other Ambulatory Visit: Payer: Self-pay

## 2023-10-07 ENCOUNTER — Other Ambulatory Visit (HOSPITAL_COMMUNITY): Payer: Self-pay

## 2023-10-07 DIAGNOSIS — N1831 Chronic kidney disease, stage 3a: Secondary | ICD-10-CM

## 2023-10-07 DIAGNOSIS — E1121 Type 2 diabetes mellitus with diabetic nephropathy: Secondary | ICD-10-CM

## 2023-10-07 MED ORDER — EMPAGLIFLOZIN 10 MG PO TABS
10.0000 mg | ORAL_TABLET | Freq: Every day | ORAL | 1 refills | Status: AC
  Filled 2023-10-07: qty 90, 90d supply, fill #0
  Filled 2024-01-06: qty 90, 90d supply, fill #1
  Filled 2024-01-07: qty 90, 90d supply, fill #0

## 2023-10-08 ENCOUNTER — Other Ambulatory Visit (HOSPITAL_COMMUNITY): Payer: Self-pay

## 2023-10-11 DIAGNOSIS — N1831 Chronic kidney disease, stage 3a: Secondary | ICD-10-CM | POA: Diagnosis not present

## 2023-10-11 DIAGNOSIS — I129 Hypertensive chronic kidney disease with stage 1 through stage 4 chronic kidney disease, or unspecified chronic kidney disease: Secondary | ICD-10-CM | POA: Diagnosis not present

## 2023-10-12 ENCOUNTER — Encounter: Payer: Self-pay | Admitting: Internal Medicine

## 2023-10-13 ENCOUNTER — Encounter (INDEPENDENT_AMBULATORY_CARE_PROVIDER_SITE_OTHER): Payer: Self-pay | Admitting: Otolaryngology

## 2023-10-13 ENCOUNTER — Other Ambulatory Visit (HOSPITAL_COMMUNITY): Payer: Self-pay

## 2023-10-13 ENCOUNTER — Ambulatory Visit (INDEPENDENT_AMBULATORY_CARE_PROVIDER_SITE_OTHER): Admitting: Otolaryngology

## 2023-10-13 ENCOUNTER — Other Ambulatory Visit: Payer: Self-pay

## 2023-10-13 VITALS — BP 120/72 | HR 62 | Temp 98.7°F | Ht 61.0 in | Wt 145.0 lb

## 2023-10-13 DIAGNOSIS — E041 Nontoxic single thyroid nodule: Secondary | ICD-10-CM

## 2023-10-13 MED ORDER — NEBIVOLOL HCL 10 MG PO TABS
10.0000 mg | ORAL_TABLET | Freq: Every day | ORAL | 1 refills | Status: AC
  Filled 2023-10-13 – 2024-01-09 (×2): qty 90, 90d supply, fill #0

## 2023-10-13 NOTE — Progress Notes (Signed)
 ENT CONSULT:  Reason for Consult: left thyroid  nodule    HPI: Discussed the use of AI scribe software for clinical note transcription with the patient, who gave verbal consent to proceed.  History of Present Illness Joann Bond is a 58 year old female who presents for evaluation of a thyroid  nodule.  She has a thyroid  nodule measuring 3.3 cm on the left side TiRADs-3, identified on a recent ultrasound along with the right sided 2.2 cm cyst. All her thyroid  function tests have been normal in the past. The patient sometimes experiences trouble swallowing.  She has a history of kidney cancer diagnosed two years ago, which has necessitated regular chest x-rays. The thyroid  nodule was identified on a recent ultrasound, after her last chest x-ray.  She sometimes experiences trouble swallowing and has a recent onset of acid reflux symptoms, which she had not experienced before. She has appt with GI for dysphagia.   There is no family history of thyroid  cancer, although her mother had thyroid  nodules that were not cancerous. No prior neck radiation hx.   Records Reviewed:  PCP visit Dr Joshua 09/01/23 She recently had a follow-up with her urologist after kidney cancer treatment, which showed no recurrence. During this visit, thyroid  nodules and cysts were discovered. She experiences pain in the thyroid  area, especially when swallowing or upon touch, but this pain has since subsided. She has not been on thyroid  medication and has never had a thyroid  ultrasound. Her thyroid  function tests have always been normal.   She has a long-standing issue with swallowing, where food sometimes feels like it gets stuck, although a previous swallowing test indicated no actual obstruction. She underwent a scope examination many years ago due to a bleeding ulcer, but no recent evaluations have been conducted.   Her blood pressure has been low, and her Norvasc  dose was reduced. Despite this, she has no weakness,  dizziness, or lightheadedness. She also has no heartburn, indigestion, or abdominal pain.   Her kidney doctor recently confirmed good kidney function. She avoids taking Motrin  or aspirin due to her kidney condition, only using them occasionally.      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, neg margins   Hypertension    Pneumonia    many years ago    Past Surgical History:  Procedure Laterality Date   EYE SURGERY     ROBOTIC ASSITED PARTIAL NEPHRECTOMY Right 07/25/2021   Procedure: XI ROBOTIC ASSITED  LAPAROSCOPIC PARTIAL NEPHRECTOMY;  Surgeon: Selma Donnice SAUNDERS, MD;  Location: [facility];  Service: Urology;  Laterality: Right;  ONLY NEEDS 240 MIN   SPINAL FUSION     Scoliosis/ age 32   SPINAL FUSION     spinal stenosis age 39   TONSILLECTOMY      Family History  Problem Relation Age of Onset   Cancer Mother    Hypertension Mother    Breast cancer Mother     Social History:  reports that she has never smoked. She has never used smokeless tobacco. She reports that she does not drink alcohol and does not use drugs.  Allergies:  Allergies  Allergen Reactions   Bactrim [Sulfamethoxazole-Trimethoprim] Hives   Cefdinir      nausea   Hydromorphone      ALG: Dilaudid   Comment:    Medications: I have  reviewed the patient's current medications.  The PMH, PSH, Medications, Allergies, and SH were reviewed and updated.  ROS: Constitutional: Negative for fever, weight loss and weight gain. Cardiovascular: Negative for chest pain and dyspnea on exertion. Respiratory: Is not experiencing shortness of breath at rest. Gastrointestinal: Negative for nausea and vomiting. Neurological: Negative for headaches. Psychiatric: The patient is not nervous/anxious  There were no vitals taken for this visit. There is no height or weight on file to calculate  BMI.  PHYSICAL EXAM:  Exam: General: Well-developed, well-nourished Communication and Voice: Clear pitch and clarity Respiratory Respiratory effort: Equal inspiration and expiration without stridor Cardiovascular Peripheral Vascular: Warm extremities with equal color/perfusion Eyes: No nystagmus with equal extraocular motion bilaterally Neuro/Psych/Balance: Patient oriented to person, place, and time; Appropriate mood and affect; Gait is intact with no imbalance; Cranial nerves I-XII are intact Head and Face Inspection: Normocephalic and atraumatic without mass or lesion Palpation: Facial skeleton intact without bony stepoffs Salivary Glands: No mass or tenderness Facial Strength: Facial motility symmetric and full bilaterally ENT Pinna: External ear intact and fully developed External canal: Canal is patent with intact skin Tympanic Membrane: Clear and mobile External Nose: No scar or anatomic deformity Internal Nose: Septum is straight. No polyp, or purulence. Mucosal edema and erythema present.  Bilateral inferior turbinate hypertrophy.  Lips, Teeth, and gums: Mucosa and teeth intact and viable TMJ: No pain to palpation with full mobility Oral cavity/oropharynx: No erythema or exudate, no lesions present Neck Neck and Trachea: Midline trachea without mass or lesion Thyroid : No mass or nodularity Lymphatics: No lymphadenopathy   Studies Reviewed: Thyroid  nodule 09/03/23 IMPRESSION: 1. Heterogeneous and multinodular thyroid  gland most consistent with multinodular goiter. 2. Nodule # 3 is a 3.3 cm TI-RADS category 3 nodule in the left mid gland meets criteria to consider fine-needle aspiration biopsy. Biopsy is recommended. 3. Large but sonographically low risk/benign colloid cyst in the right mid gland measures up to 2.8 cm.  Assessment/Plan: No diagnosis found.  Assessment and Plan Assessment & Plan Left thyroid  nodule 3.3 cm nodule requires biopsy to assess cancer  risk. No family history of thyroid  cancer. Normal thyroid  function tests. - Order ultrasound-guided fine needle aspiration biopsy of the left thyroid  nodule. - Call with biopsy results. - Consider genetic testing if biopsy results are inconclusive.  Contralateral thyroid  cyst Asymptomatic cyst without compressive symptoms. - monitor for now  Gastroesophageal reflux disease (GERD) Swallowing difficulty may be related to GERD due to upper esophageal sphincter tightening. - see gastroenterology for further evaluation as scheduled. -  Reflux Gourmet after meals - diet and lifestyle changes to minimize GERD - Refer to [employer] blog for dietary and lifestyle modifications/reflux cook book     Thank you for allowing me to participate in the care of this patient. Please do not hesitate to contact me with any questions or concerns.   Elena Larry, MD Otolaryngology [facility] Phone: [phone] Fax: [phone]    10/13/2023, 2:02 PM

## 2023-10-20 ENCOUNTER — Other Ambulatory Visit (HOSPITAL_COMMUNITY): Payer: Self-pay

## 2023-11-02 ENCOUNTER — Ambulatory Visit (HOSPITAL_COMMUNITY)
Admission: RE | Admit: 2023-11-02 | Discharge: 2023-11-02 | Disposition: A | Discharge status: Home / Self Care | Source: Ambulatory Visit | Attending: Cardiology | Admitting: Cardiology

## 2023-11-02 DIAGNOSIS — R9431 Abnormal electrocardiogram [ECG] [EKG]: Secondary | ICD-10-CM | POA: Diagnosis not present

## 2023-11-02 LAB — ECHOCARDIOGRAM COMPLETE
Area-P 1/2: 3.81 cm2
S' Lateral: 2.15 cm

## 2023-11-06 ENCOUNTER — Ambulatory Visit: Admission: EM | Admit: 2023-11-06 | Discharge: 2023-11-06 | Disposition: A | Discharge status: Home / Self Care

## 2023-11-06 DIAGNOSIS — J069 Acute upper respiratory infection, unspecified: Secondary | ICD-10-CM

## 2023-11-06 DIAGNOSIS — J040 Acute laryngitis: Secondary | ICD-10-CM

## 2023-11-06 NOTE — ED Triage Notes (Signed)
 Jai reports a sore throat that started on Tuesday, loss of voice on Thursday. Treated with Tea w/honey, Orange juice, Flonase and Benadryl .

## 2023-11-06 NOTE — Discharge Instructions (Signed)

## 2023-11-07 NOTE — ED Provider Notes (Signed)
 [facility]    CSN: [id number] Arrival date & time: 11/06/23  1443      History   Chief Complaint No chief complaint on file.   HPI Joann Bond is a 58 y.o. female.   Patient presents today due to started with throat pain on Tuesday that developed into loss of voice on Thursday.  Patient states that she has been taking Flonase, Benadryl , honey, orange juice, with no relief of symptoms.  Patient denies fever, chills, nausea, vomiting, diarrhea, shortness of breath, or cough.  The history is provided by the patient.    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, neg margins   Hypertension    Pneumonia    many years ago    Patient Active Problem List   Diagnosis Date Noted   Left thyroid  nodule 09/13/2023   Solitary nodule of right lobe of thyroid  09/01/2023   Primary hypertension 09/01/2023   Abnormal EKG 09/01/2023   Pharyngoesophageal dysphagia 09/01/2023   Cervical cancer screening 02/16/2023   Chronic pain disorder 07/06/2022   Hyperlipidemia with target LDL less than 100 11/05/2021   Malignant hypertension 09/18/2021   Type 2 diabetes mellitus with diabetic nephropathy, without long-term current use of insulin (HCC) 09/18/2021   Vitamin D  deficiency 09/15/2021   Age-related osteoporosis with current pathological fracture 01/24/2020   Encounter for general adult medical examination with abnormal findings 01/24/2020   Stage 3a chronic kidney disease (HCC) 01/24/2020   Spinal stenosis 08/16/2019    Past Surgical History:  Procedure Laterality Date   EYE SURGERY     ROBOTIC ASSITED PARTIAL NEPHRECTOMY Right 07/25/2021   Procedure: XI ROBOTIC ASSITED  LAPAROSCOPIC PARTIAL NEPHRECTOMY;  Surgeon: Selma Donnice SAUNDERS, MD;  Location: [facility];  Service: Urology;  Laterality: Right;  ONLY NEEDS 240 MIN   SPINAL FUSION      Scoliosis/ age 64   SPINAL FUSION     spinal stenosis age 89   TONSILLECTOMY      OB History     Gravida  0   Para  0   Term  0   Preterm  0   AB  0   Living  0      SAB  0   IAB  0   Ectopic  0   Multiple  0   Live Births  0            Home Medications    Prior to Admission medications   Medication Sig Start Date End Date Taking? Authorizing Provider  amLODipine  (NORVASC ) 5 MG tablet Take 1.5 tablets (7.5 mg total) by mouth daily. 04/27/23     empagliflozin  (JARDIANCE ) 10 MG TABS tablet Take 1 tablet (10 mg total) by mouth daily before breakfast. 10/07/23   Joshua Debby CROME, MD  gabapentin  (NEURONTIN ) 300 MG capsule Take 1 capsule (300 mg total) by mouth 3 (three) times daily. 08/18/23     nebivolol  (BYSTOLIC ) 10 MG tablet Take 1 tablet (10 mg total) by mouth daily. 10/13/23   Joshua Debby CROME, MD  oxyCODONE -acetaminophen  (PERCOCET/ROXICET) 5-325 MG tablet Take 1 tablet by mouth every 8 (eight) hours as needed for pain for up to 30 days. Max Daily Amount: 3 tablets. 08/20/23     oxyCODONE -acetaminophen  (PERCOCET/ROXICET) 5-325 MG tablet Take one tablet by mouth every 8 (  eight) hours as needed for Pain for up to 30 days. Max Daily Amount: 3 tablets (10/19/23) 10/19/23     oxyCODONE -acetaminophen  (PERCOCET/ROXICET) 5-325 MG tablet Take 1 tablet by mouth every 8 (eight) hours as needed for pain. Max Daily Amount: 3 tablets. 09/17/23     PROLIA  60 MG/ML SOSY injection Inject 60 mg into the skin every 6 (six) months. 06/19/22   Jegede, Olugbemiga E, MD  rosuvastatin  (CRESTOR ) 10 MG tablet Take 1 tablet (10 mg total) by mouth daily. 09/01/23   Joshua Debby CROME, MD  tizanidine  (ZANAFLEX ) 2 MG capsule Take 1 capsule (2 mg total) by mouth 3 (three) times daily. 08/18/23     valsartan  (DIOVAN ) 80 MG tablet Take 1 tablet (80 mg total) by mouth 2 (two) times daily. 08/18/23   Joshua Debby CROME, MD    Family History Family History  Problem Relation Age of Onset   Cancer Mother     Hypertension Mother    Breast cancer Mother     Social History Social History   Tobacco Use   Smoking status: Never   Smokeless tobacco: Never  Vaping Use   Vaping status: Never Used  Substance Use Topics   Alcohol use: Never   Drug use: Never     Allergies   Bactrim [sulfamethoxazole-trimethoprim], Cefdinir , and Hydromorphone    Review of Systems Review of Systems   Physical Exam Triage Vital Signs ED Triage Vitals  Encounter Vitals Group     BP 11/06/23 1534 133/84     Girls Systolic BP Percentile --      Girls Diastolic BP Percentile --      Boys Systolic BP Percentile --      Boys Diastolic BP Percentile --      Pulse --      Resp 11/06/23 1534 16     Temp 11/06/23 1534 98.7 F (37.1 C)     Temp Source 11/06/23 1534 Oral     SpO2 11/06/23 1534 95 %     Weight 11/06/23 1531 145 lb (65.8 kg)     Height 11/06/23 1531 5' 2 (1.575 m)     Head Circumference --      Peak Flow --      Pain Score 11/06/23 1531 5     Pain Loc --      Pain Education --      Exclude from Growth Chart --    No data found.  Updated Vital Signs BP 133/84 (BP Location: Left Arm)   Temp 98.7 F (37.1 C) (Oral)   Resp 16   Ht 5' 2 (1.575 m)   Wt 145 lb (65.8 kg)   SpO2 95%   BMI 26.52 kg/m   Visual Acuity Right Eye Distance:   Left Eye Distance:   Bilateral Distance:    Right Eye Near:   Left Eye Near:    Bilateral Near:     Physical Exam Vitals and nursing note reviewed.  Constitutional:      General: She is not in acute distress.    Appearance: Normal appearance. She is not ill-appearing, toxic-appearing or diaphoretic.  HENT:     Mouth/Throat:     Mouth: Mucous membranes are moist.     Pharynx: Oropharynx is clear. No oropharyngeal exudate or posterior oropharyngeal erythema.  Eyes:     General: No scleral icterus. Cardiovascular:     Rate and Rhythm: Normal rate and regular rhythm.     Heart sounds: Normal heart sounds.  Pulmonary:  Effort: Pulmonary  effort is normal. No respiratory distress.     Breath sounds: Normal breath sounds. No wheezing or rhonchi.  Skin:    General: Skin is warm.  Neurological:     Mental Status: She is alert and oriented to person, place, and time.  Psychiatric:        Mood and Affect: Mood normal.        Behavior: Behavior normal.      UC Treatments / Results  Labs (all labs ordered are listed, but only abnormal results are displayed) Labs Reviewed - No data to display  EKG   Radiology No results found.  Procedures Procedures (including critical care time)  Medications Ordered in UC Medications - No data to display  Initial Impression / Assessment and Plan / UC Course  I have reviewed the triage vital signs and the nursing notes.  Pertinent labs & imaging results that were available during my care of the patient were reviewed by me and considered in my medical decision making (see chart for details).     Symptoms most closely resemble viral illness.  Gave patient suggestions for supportive treatment. Final Clinical Impressions(s) / UC Diagnoses   Final diagnoses:  Viral URI  Laryngitis     Discharge Instructions      You been diagnosed with a viral illness today. -Viruses have to run their course and medicines that are prescribed are meant to help with symptoms. - With viruses usually feel poorly from 3 to 7 days with cough being the last symptoms to resolve.  -Cough can linger from days to weeks.  Antibiotics are not effective for viruses. -If your cough lasts more than 2 weeks and you are coughing so hard that you are vomiting or feel like you could pass out we need to follow-up with PCP for further testing and evaluation. -Rest, increase water intake, may use pseudoephedrine for nasal congestion, Delsym (dextromethorphan) or honey as needed for cough, and ibuprofen  and/or Tylenol  as directed on packaging for pain and fever. -If you have hypertension you should take Coricidin or  other OTC meds approved for people with high blood pressure. -You may use a spoonful of honey every 4-6 hours as needed for throat pain and cough. -Warm tea with honey and lemon are helpful for soothe throat as well.  Chloraseptic and Cepacol make a throat lozenge with numbing medication, can be purchased over-the-counter. -May also use Flonase or sinus rinse for sinus pressure or nasal congestion.  Be sure to use distilled bottled water for sinus rinses. -May use coolmist humidifier to open up nasal passages -May elevate head to assist with postnasal drainage. -If you feel poorly (fever, fatigue, shortness of breath, nausea, etc.) for more than 10 days to be sure to follow-up with PCP or in clinic for further evaluation and additional treatments. If you experience chest pain with shortness of breath or pulse oxygen less than 95% you should report to the ER.     ED Prescriptions   None    PDMP not reviewed this encounter.   Andra Corean BROCKS, PA-C 11/07/23 [phone]

## 2023-11-08 ENCOUNTER — Inpatient Hospital Stay: Admission: RE | Admit: 2023-11-08 | Source: Ambulatory Visit

## 2023-11-08 ENCOUNTER — Other Ambulatory Visit: Payer: Self-pay | Admitting: Internal Medicine

## 2023-11-08 DIAGNOSIS — Z1231 Encounter for screening mammogram for malignant neoplasm of breast: Secondary | ICD-10-CM

## 2023-11-09 ENCOUNTER — Ambulatory Visit: Admitting: Physician Assistant

## 2023-11-12 ENCOUNTER — Other Ambulatory Visit: Payer: Self-pay

## 2023-11-12 ENCOUNTER — Other Ambulatory Visit (HOSPITAL_COMMUNITY): Payer: Self-pay

## 2023-11-12 ENCOUNTER — Encounter: Payer: Self-pay | Admitting: Internal Medicine

## 2023-11-12 MED ORDER — VALSARTAN 80 MG PO TABS
80.0000 mg | ORAL_TABLET | Freq: Two times a day (BID) | ORAL | 0 refills | Status: DC
  Filled 2023-11-12: qty 180, 90d supply, fill #0

## 2023-11-17 ENCOUNTER — Other Ambulatory Visit (HOSPITAL_COMMUNITY): Payer: Self-pay

## 2023-11-17 MED ORDER — OXYCODONE-ACETAMINOPHEN 5-325 MG PO TABS
1.0000 | ORAL_TABLET | Freq: Three times a day (TID) | ORAL | 0 refills | Status: AC | PRN
  Filled 2024-01-19: qty 90, 30d supply, fill #0

## 2023-11-17 MED ORDER — OXYCODONE-ACETAMINOPHEN 5-325 MG PO TABS
1.0000 | ORAL_TABLET | Freq: Three times a day (TID) | ORAL | 0 refills | Status: AC | PRN
  Filled 2023-12-20: qty 90, 30d supply, fill #0

## 2023-11-17 MED ORDER — OXYCODONE-ACETAMINOPHEN 5-325 MG PO TABS
1.0000 | ORAL_TABLET | Freq: Three times a day (TID) | ORAL | 0 refills | Status: AC | PRN
  Filled 2023-11-19: qty 90, 30d supply, fill #0

## 2023-11-17 MED ORDER — OXYCODONE-ACETAMINOPHEN 5-325 MG PO TABS: 1.0000 | ORAL_TABLET | Freq: Three times a day (TID) | ORAL | 0 refills | Status: AC | PRN

## 2023-11-19 ENCOUNTER — Other Ambulatory Visit: Payer: Self-pay

## 2023-11-19 ENCOUNTER — Other Ambulatory Visit (HOSPITAL_COMMUNITY): Payer: Self-pay

## 2023-11-25 ENCOUNTER — Other Ambulatory Visit: Payer: Self-pay

## 2023-11-25 ENCOUNTER — Other Ambulatory Visit (HOSPITAL_COMMUNITY): Payer: Self-pay

## 2023-11-25 MED ORDER — GABAPENTIN 300 MG PO CAPS
300.0000 mg | ORAL_CAPSULE | Freq: Three times a day (TID) | ORAL | 1 refills | Status: AC
  Filled 2023-11-25 – 2024-01-07 (×2): qty 270, 90d supply, fill #0

## 2023-11-25 MED ORDER — TIZANIDINE HCL 2 MG PO CAPS
2.0000 mg | ORAL_CAPSULE | Freq: Three times a day (TID) | ORAL | 1 refills | Status: AC
  Filled 2023-11-25: qty 270, 90d supply, fill #0

## 2023-12-06 ENCOUNTER — Encounter: Payer: Self-pay | Admitting: Internal Medicine

## 2023-12-06 ENCOUNTER — Ambulatory Visit: Payer: Self-pay | Admitting: Internal Medicine

## 2023-12-06 ENCOUNTER — Ambulatory Visit: Admitting: Internal Medicine

## 2023-12-06 VITALS — BP 132/86 | HR 60 | Temp 98.1°F | Resp 16 | Ht 62.0 in | Wt 147.0 lb

## 2023-12-06 DIAGNOSIS — N1831 Chronic kidney disease, stage 3a: Secondary | ICD-10-CM

## 2023-12-06 DIAGNOSIS — I10 Essential (primary) hypertension: Secondary | ICD-10-CM

## 2023-12-06 DIAGNOSIS — E1121 Type 2 diabetes mellitus with diabetic nephropathy: Secondary | ICD-10-CM | POA: Diagnosis not present

## 2023-12-06 LAB — URINALYSIS, ROUTINE W REFLEX MICROSCOPIC
Bilirubin Urine: NEGATIVE
Hgb urine dipstick: NEGATIVE
Ketones, ur: NEGATIVE
Leukocytes,Ua: NEGATIVE
Nitrite: NEGATIVE
RBC / HPF: NONE SEEN (ref 0–?)
Specific Gravity, Urine: 1.02 (ref 1.000–1.030)
Total Protein, Urine: NEGATIVE
Urine Glucose: >=1000 — AB
Urobilinogen, UA: 0.2 (ref 0.0–1.0)
pH: 5.5 (ref 5.0–8.0)

## 2023-12-06 LAB — BASIC METABOLIC PANEL WITH GFR
BUN: 25 mg/dL — ABNORMAL HIGH (ref 6–23)
CO2: 28 meq/L (ref 19–32)
Calcium: 10 mg/dL (ref 8.4–10.5)
Chloride: 106 meq/L (ref 96–112)
Creatinine, Ser: 0.91 mg/dL (ref 0.40–1.20)
GFR: 69.38 mL/min (ref >60.00)
Glucose, Bld: 107 mg/dL — ABNORMAL HIGH (ref 70–99)
Potassium: 4.8 meq/L (ref 3.5–5.1)
Sodium: 141 meq/L (ref 135–145)

## 2023-12-06 LAB — MICROALBUMIN / CREATININE URINE RATIO
Creatinine,U: 56.3 mg/dL
Microalb Creat Ratio: UNDETERMINED mg/g (ref 0.0–30.0)
Microalb, Ur: <0.7 mg/dL

## 2023-12-06 LAB — CBC WITH DIFFERENTIAL/PLATELET
Basophils Absolute: 0.1 K/uL (ref 0.0–0.1)
Basophils Relative: 0.8 % (ref 0.0–3.0)
Eosinophils Absolute: 0.4 K/uL (ref 0.0–0.7)
Eosinophils Relative: 5.5 % — ABNORMAL HIGH (ref 0.0–5.0)
HCT: 42.2 % (ref 36.0–46.0)
Hemoglobin: 13.9 g/dL (ref 12.0–15.0)
Lymphocytes Relative: 25.6 % (ref 12.0–46.0)
Lymphs Abs: 1.8 K/uL (ref 0.7–4.0)
MCHC: 33 g/dL (ref 30.0–36.0)
MCV: 88.6 fl (ref 78.0–100.0)
Monocytes Absolute: 0.7 K/uL (ref 0.1–1.0)
Monocytes Relative: 9.7 % (ref 3.0–12.0)
Neutro Abs: 4.1 K/uL (ref 1.4–7.7)
Neutrophils Relative %: 58.4 % (ref 43.0–77.0)
Platelets: 251 K/uL (ref 150.0–400.0)
RBC: 4.77 Mil/uL (ref 3.87–5.11)
RDW: 15.2 % (ref 11.5–15.5)
WBC: 7 K/uL (ref 4.0–10.5)

## 2023-12-06 LAB — HEMOGLOBIN A1C: Hgb A1c MFr Bld: 6.4 % (ref 4.6–6.5)

## 2023-12-06 NOTE — Progress Notes (Unsigned)
 Subjective:  Patient ID: Joann Bond, female    DOB: [birth year]  Age: 58 y.o. MRN: [id number]  CC: Diabetes and Hypertension   HPI Joann Bond presents for f/up ---  Discussed the use of AI scribe software for clinical note transcription with the patient, who gave verbal consent to proceed.  History of Present Illness Joann Bond is a 58 year old female who presents with fatigue and swollen ankles.  She experiences fatigue, which she attributes to working three consecutive twelve-hour shifts. There is no new onset of fatigue, and she sleeps well. No chest pain, shortness of breath, dizziness, or lightheadedness.  She had laryngitis about a month ago, which prevented her from undergoing a scheduled thyroid  procedure. During that time, she had a sore throat and was unable to speak, but the condition has since resolved. She had a fever for one day during the episode but currently has no symptoms such as coughing, wheezing, or night sweats.  She reports swelling in her ankles, particularly after work, and occasionally notices swelling in her hands. She avoids salt in her diet.  Her current medications include valsartan , nebivolol , and Jardiance , with no reported side effects. She has received her flu, pneumonia, and shingles vaccinations but has opted not to receive the COVID vaccine for the fall.     Outpatient Medications Prior to Visit  Medication Sig Dispense Refill   empagliflozin  (JARDIANCE ) 10 MG TABS tablet Take 1 tablet (10 mg total) by mouth daily before breakfast. 90 tablet 1   gabapentin  (NEURONTIN ) 300 MG capsule Take 1 capsule (300 mg total) by mouth 3 (three) times daily. 270 capsule 1   gabapentin  (NEURONTIN ) 300 MG capsule Take 1 capsule (300 mg total) by mouth 3 (three) times daily. 270 capsule 1   nebivolol  (BYSTOLIC ) 10 MG tablet Take 1 tablet (10 mg total) by mouth daily. 90 tablet 1   oxyCODONE -acetaminophen  (PERCOCET/ROXICET) 5-325 MG tablet Take 1 tablet by  mouth every 8 (eight) hours as needed for pain for up to 30 days. Max Daily Amount: 3 tablets. 90 tablet 0   oxyCODONE -acetaminophen  (PERCOCET/ROXICET) 5-325 MG tablet Take one tablet by mouth every 8 (eight) hours as needed for Pain for up to 30 days. Max Daily Amount: 3 tablets (10/19/23) 90 tablet 0   oxyCODONE -acetaminophen  (PERCOCET/ROXICET) 5-325 MG tablet Take 1 tablet by mouth every 8 (eight) hours as needed for pain. Max Daily Amount: 3 tablets. 90 tablet 0   oxyCODONE -acetaminophen  (PERCOCET/ROXICET) 5-325 MG tablet Take 1 tablet by mouth every 8 (eight) hours as needed for pain. 90 tablet 0   [START ON 01/18/2024] oxyCODONE -acetaminophen  (PERCOCET/ROXICET) 5-325 MG tablet Take 1 tablet by mouth every 8 (eight) hours as needed for pain. 90 tablet 0   [START ON 12/19/2023] oxyCODONE -acetaminophen  (PERCOCET/ROXICET) 5-325 MG tablet Take 1 tablet by mouth every 8 (eight) hours as needed for pain. 90 tablet 0   [START ON 12/17/2023] oxyCODONE -acetaminophen  (PERCOCET/ROXICET) 5-325 MG tablet Take one tablet by mouth every 8 (eight) hours as needed for Pain for up to 30 days. 12/19/23 - 01/18/24 Max Daily Amount: 3 tablets 90 tablet 0   PROLIA  60 MG/ML SOSY injection Inject 60 mg into the skin every 6 (six) months. 1 mL 1   rosuvastatin  (CRESTOR ) 10 MG tablet Take 1 tablet (10 mg total) by mouth daily. 90 tablet 1   tizanidine  (ZANAFLEX ) 2 MG capsule Take 1 capsule (2 mg total) by mouth 3 (three) times daily. 270 capsule 1   tizanidine  (ZANAFLEX ) 2 MG capsule  Take 1 capsule (2 mg total) by mouth 3 (three) times daily. 270 capsule 1   valsartan  (DIOVAN ) 80 MG tablet Take 1 tablet (80 mg total) by mouth 2 (two) times daily. 180 tablet 0   amLODipine  (NORVASC ) 5 MG tablet Take 1.5 tablets (7.5 mg total) by mouth daily. 50 tablet 6   No facility-administered medications prior to visit.    ROS Review of Systems  Objective:  BP 132/86 (BP Location: Left Arm, Patient Position: Sitting, Cuff Size:  Normal)   Pulse 60   Temp 98.1 F (36.7 C) (Oral)   Resp 16   Ht 5' 2 (1.575 m)   Wt 147 lb (66.7 kg)   SpO2 97%   BMI 26.89 kg/m   BP Readings from Last 3 Encounters:  12/06/23 132/86  11/06/23 133/84  10/13/23 120/72    Wt Readings from Last 3 Encounters:  12/06/23 147 lb (66.7 kg)  11/06/23 145 lb (65.8 kg)  10/13/23 145 lb (65.8 kg)    Physical Exam  Lab Results  Component Value Date   WBC 8.7 02/16/2023   HGB 13.7 02/16/2023   HCT 42.5 02/16/2023   PLT 277.0 02/16/2023   GLUCOSE 110 (H) 09/01/2023   CHOL 162 02/16/2023   TRIG 262.0 (H) 02/16/2023   HDL 55.40 02/16/2023   LDLDIRECT 132.0 11/05/2021   LDLCALC 55 02/16/2023   ALT 17 09/01/2023   AST 15 09/01/2023   NA 140 09/01/2023   K 4.9 09/01/2023   CL 101 09/01/2023   CREATININE 1.00 09/01/2023   BUN 24 (H) 09/01/2023   CO2 27 09/01/2023   TSH 1.35 09/01/2023   HGBA1C 7.1 (H) 09/01/2023   MICROALBUR <0.7 02/16/2023    No results found.  Assessment & Plan:  Stage 3a chronic kidney disease (HCC) -     Basic metabolic panel with GFR; Future -     Microalbumin / creatinine urine ratio; Future -     Urinalysis, Routine w reflex microscopic; Future  Type 2 diabetes mellitus with diabetic nephropathy, without long-term current use of insulin (HCC) -     Urinalysis, Routine w reflex microscopic; Future -     Hemoglobin A1c; Future  Malignant hypertension -     CBC with Differential/Platelet; Future -     Microalbumin / creatinine urine ratio; Future -     Urinalysis, Routine w reflex microscopic; Future     Follow-up: No follow-ups on file.  Debby Molt, MD

## 2023-12-06 NOTE — Patient Instructions (Signed)
 Chronic Kidney Disease in Adults: What to Know Chronic kidney disease (CKD) is when lasting damage happens to the kidneys slowly over time. The kidneys are two organs that do many important things in the body. These include: Taking waste and extra fluid out of the blood to make pee (urine). Making hormones. Keeping the right amount of fluids and chemicals in the body. A small amount of kidney damage may not cause problems. You must take steps to help keep the kidney damage from getting worse. A lot of damage may cause kidney failure. Kidney failure means the kidneys can no longer work right. What are the causes? Diabetes. High blood pressure. Diseases that affect the heart and blood vessels. Other kidney diseases. Diseases that affect the body's defense system (immune system). A problem with the flow of pee. This may be caused by: Kidney stones. Cancer. An enlarged prostate, in males. A kidney infection or urinary tract infection (UTI) that keeps coming back. What increases the risk? Getting older. The chances of having CKD increase with age. A family history of kidney disease or kidney failure. Having a disease caused by genes. Taking medicines that can harm the kidneys. Being near or having contact with harmful substances. Being very overweight. Using tobacco now or in the past. What are the signs or symptoms? Common symptoms of CKD include: Feeling very tired and having less energy. Swelling of the face, legs, ankles, or feet. Throwing up or feeling like you may throw up. Not wanting to eat as much as normal. Being confused or not able to focus. Twitches and cramps in the leg muscles or other muscles. Dry, itchy skin. Other symptoms may include: Shortness of breath. Trouble sleeping. Making less pee, or making more pee, especially at night. A taste of metal in your mouth. You may also become anemic. Anemia means there's not enough red blood cells in your blood. You may get  symptoms slowly. You may not notice them until the kidney damage gets very bad. How is this diagnosed? CKD may be diagnosed based on: Tests on your blood or pee. Imaging tests, like an ultrasound or a CT scan. A kidney biopsy. For this test, a sample of kidney tissue is removed to be looked at under a microscope. These tests will help to find out how serious the CKD is. How is this treated? Often, there's no cure for CKD. Treatment can help with symptoms and help keep the disease from getting worse. Treatment may include: Treating other problems that are causing your CKD or making it worse. Diet changes. You may need to: Avoid alcohol. Avoid foods that are high in salt, potassium, phosphorous, and protein. Taking medicines for symptoms and to help control other conditions. Dialysis. This treatment gets harmful waste out of your body. It may be needed if you have kidney failure. Follow these instructions at home: Medicines Take your medicines only as told. The amount of some medicines you take may need to be changed. Do not take any new medicines, vitamins, or supplements unless your health care provider says it's okay. These may make kidney damage worse. Lifestyle Do not smoke, vape, or use nicotine or tobacco. If you drink alcohol: Limit how much you have to: 0-1 drink a day if you're female. 0-2 drinks a day if you're female. Know how much alcohol is in your drink. In the [place] one drink is one 12 oz bottle of beer (355 mL), one 5 oz glass of wine (148 mL), or one 1 oz  glass of hard liquor (44 mL). Stay at a healthy weight. If you need help, ask your provider. General instructions  Eat and drink as told. Track your blood pressure at home. Tell your provider about any changes. If you have diabetes, track your blood sugar as told. Exercise at least 30 minutes a day, 5 days a week. Keep your shots (vaccinations) up to date. Keep all follow-up visits. Your provider may need to change  your treatments over time. Where to find support American Kid[employer] Fund: [place].com.[employer] Kidney School: kidneyschool.org American Association of Kidney Patients: https://www.miller-montoya.com/ Where to find more information National Kidney Foundation: kidney.org Centers for Disease Control and Prevention. To learn more: Go to DiningCalendar.de. Click "Search". Type "chronic kidney disease" in the search box. Contact a health care provider if: You have new symptoms. You get symptoms of end-stage kidney disease. These include: Headaches. Numbness in your hands or feet. Leg cramps. Easy bruising. Get help right away if: You have a fever. You make less pee than usual. You have pain or bleeding when you pee or poop. You have chest pain. You have shortness of breath. These symptoms may be an emergency. Call 911 right away. Do not wait to see if the symptoms will go away. Do not drive yourself to the hospital. This information is not intended to replace advice given to you by your health care provider. Make sure you discuss any questions you have with your health care provider. Document Revised: 11/03/2022 Document Reviewed: 06/26/2022 Elsevier Patient Education  2024 [employer].

## 2023-12-08 ENCOUNTER — Ambulatory Visit
Admission: RE | Admit: 2023-12-08 | Discharge: 2023-12-08 | Disposition: A | Source: Ambulatory Visit | Attending: Otolaryngology

## 2023-12-08 ENCOUNTER — Other Ambulatory Visit (HOSPITAL_COMMUNITY): Admission: RE | Admit: 2023-12-08 | Discharge: 2023-12-08 | Disposition: A | Source: Ambulatory Visit

## 2023-12-08 DIAGNOSIS — E041 Nontoxic single thyroid nodule: Secondary | ICD-10-CM

## 2023-12-08 DIAGNOSIS — E1121 Type 2 diabetes mellitus with diabetic nephropathy: Secondary | ICD-10-CM | POA: Diagnosis not present

## 2023-12-08 DIAGNOSIS — N1831 Chronic kidney disease, stage 3a: Secondary | ICD-10-CM | POA: Diagnosis not present

## 2023-12-08 DIAGNOSIS — I129 Hypertensive chronic kidney disease with stage 1 through stage 4 chronic kidney disease, or unspecified chronic kidney disease: Secondary | ICD-10-CM | POA: Diagnosis not present

## 2023-12-10 LAB — CYTOLOGY - NON PAP

## 2023-12-14 ENCOUNTER — Other Ambulatory Visit: Payer: Self-pay | Admitting: Internal Medicine

## 2023-12-14 ENCOUNTER — Other Ambulatory Visit (HOSPITAL_COMMUNITY): Payer: Self-pay

## 2023-12-14 ENCOUNTER — Other Ambulatory Visit: Payer: Self-pay

## 2023-12-14 DIAGNOSIS — M810 Age-related osteoporosis without current pathological fracture: Secondary | ICD-10-CM

## 2023-12-14 MED ORDER — DENOSUMAB 60 MG/ML ~~LOC~~ SOSY
60.0000 mg | PREFILLED_SYRINGE | Freq: Once | SUBCUTANEOUS | 0 refills | Status: AC
  Filled 2023-12-14: qty 1, 1d supply, fill #0
  Filled 2024-01-18 (×2): qty 1, 180d supply, fill #0
  Filled 2024-01-19: qty 1, 30d supply, fill #0

## 2023-12-15 ENCOUNTER — Ambulatory Visit: Attending: Internal Medicine | Admitting: Pharmacist

## 2023-12-15 DIAGNOSIS — Z79899 Other long term (current) drug therapy: Secondary | ICD-10-CM

## 2023-12-15 NOTE — Progress Notes (Signed)
 S:  Patient presents for review of their specialty medication therapy.  Patient is currently taking Prolia  for osteoporosis. Patient is managed by Dr. Joshua for this.  Adherence: confirms   Efficacy: reports that she believes the medication is working for her.   Dosing: 60 mg once every 6 months.  Dose adjustments: Renal: Monitor patients with severe impairment (CrCl <30 mL/minute or on dialysis) closely, as significant and prolonged hypocalcemia (incidence of 29% and potentially lasting weeks to months) and marked elevations of serum parathyroid hormone are serious risks in this population. Ensure adequate calcium  and vitamin D  intake/supplementation. CrCl >=30 mL/minute: No dosage adjustment necessary. CrCl <30 mL/minute: No dosage adjustment necessary; use in conjunction with guidance from patient's nephrology team. Hepatic: no dose adjustments (has not been studied)  Drug-drug interactions: none  Monitoring: S/sx of infection: none S/sx of hypersensitivity: none S/sx of hypocalcemia/hypercalcemia: none Dermatitis/skin rash: none Peripheral edema: none HA: none GI upset:none   Other side effects: none  Last bone density study: 08/2022   O:      Lab Results  Component Value Date   WBC 7.0 12/06/2023   HGB 13.9 12/06/2023   HCT 42.2 12/06/2023   MCV 88.6 12/06/2023   PLT 251.0 12/06/2023      Chemistry      Component Value Date/Time   NA 141 12/06/2023 1150   K 4.8 12/06/2023 1150   CL 106 12/06/2023 1150   CO2 28 12/06/2023 1150   BUN 25 (H) 12/06/2023 1150   CREATININE 0.91 12/06/2023 1150      Component Value Date/Time   CALCIUM  10.0 12/06/2023 1150   ALKPHOS 61 09/01/2023 1103   AST 15 09/01/2023 1103   ALT 17 09/01/2023 1103   BILITOT 0.5 09/01/2023 1103       A/P: 1. Medication review: Patient is taking Prolia  for osteoporosis. Reviewed the medication with the patient, including the following: Prolia  (denosumab ) is a monoclonal antibody with  affinity for nuclear factor-kappa ligand (RANKL). Prolia  binds to RANKL and prevents osteoclast formation, leading to decreased bone resorption and increased bone mass in osteoporosis. Patient educated on purpose, proper use, and potential adverse effects of Prolia . The most common adverse effects are hypersensitivities, peripheral edema, dermatitis/skin rash, GI upset, HA, joint pain, and infection. There is the possibility of atypical femur fracture, serum calcium  disturbances, and osteonecrosis of the jaw. Patients should monitor for and report hip, thigh, or groin pain. Additionally, patients should monitor for and report jaw pain, tooth/periodontal infection, toothache, and/or gingival ulceration/erosion. Prolia  exists as a solution prefilled syringe for SQ administration. Administration: Denosumab  is intended for SubQ route only and should not be administered IV, IM, or intradermally. Prior to administration, bring to room temperature in original container (allow to stand ~15 to 30 minutes); do not warm by any other method. Solution may contain trace amounts of translucent to white protein particles; do not use if cloudy, discolored (normal solution should be clear and colorless to pale yellow), or contains excessive particles or foreign matter. Avoid vigorous shaking. Administer via SubQ injection in the upper arm, upper thigh, or abdomen; should only be administered by a health care professional.  Joann Bond, PharmD, BCACP, CPP Clinical Pharmacist [facility] [phone]

## 2023-12-20 ENCOUNTER — Other Ambulatory Visit (HOSPITAL_COMMUNITY): Payer: Self-pay

## 2023-12-21 ENCOUNTER — Ambulatory Visit
Admission: RE | Admit: 2023-12-21 | Discharge: 2023-12-21 | Disposition: A | Source: Ambulatory Visit | Attending: Internal Medicine | Admitting: Internal Medicine

## 2023-12-21 DIAGNOSIS — Z1231 Encounter for screening mammogram for malignant neoplasm of breast: Secondary | ICD-10-CM

## 2023-12-22 ENCOUNTER — Telehealth (INDEPENDENT_AMBULATORY_CARE_PROVIDER_SITE_OTHER): Payer: Self-pay

## 2023-12-22 ENCOUNTER — Telehealth: Payer: Self-pay | Admitting: Internal Medicine

## 2023-12-22 NOTE — Telephone Encounter (Signed)
 Patient called today 12/22/2023 at 2:00 PM, inquiring about her biopsy results. I called patient explained that Dr. Soldatova is no longer with our practice and I would ask another provider to take a look her results and give her a call back.  I had asked Dr. Anice if he could take a look at the results.

## 2023-12-22 NOTE — Telephone Encounter (Signed)
 Copied from CRM #[id number]. Topic: Appointments - Scheduling Inquiry for Clinic >> Dec 22, 2023  1:39 PM Eva FALCON wrote: Reason for CRM: pt was calling to schedule her Prolia  shot. Per [facility] it said send crm to clinic for scheduled. Call back number [phone].

## 2023-12-22 NOTE — Telephone Encounter (Signed)
 Please help as I am confused

## 2023-12-24 ENCOUNTER — Ambulatory Visit: Admitting: Physician Assistant

## 2023-12-24 ENCOUNTER — Encounter: Payer: Self-pay | Admitting: Physician Assistant

## 2023-12-24 VITALS — BP 100/64 | HR 66 | Ht 61.0 in | Wt 146.2 lb

## 2023-12-24 DIAGNOSIS — K219 Gastro-esophageal reflux disease without esophagitis: Secondary | ICD-10-CM

## 2023-12-24 DIAGNOSIS — R1310 Dysphagia, unspecified: Secondary | ICD-10-CM | POA: Diagnosis not present

## 2023-12-24 NOTE — Patient Instructions (Signed)
 You have been scheduled for a Barium Esophogram at [facility] (1st floor of the hospital) on 01/12/24 at 9:00 am. Please arrive 30 minutes prior to your appointment for registration. Make certain not to have anything to eat or drink 3 hours prior to your test. If you need to reschedule for any reason, please contact radiology at [phone] to do so. __________________________________________________________________ A barium swallow is an examination that concentrates on views of the esophagus. This tends to be a double contrast exam (barium and two liquids which, when combined, create a gas to distend the wall of the oesophagus) or single contrast (non-ionic iodine based). The study is usually tailored to your symptoms so a good history is essential. Attention is paid during the study to the form, structure and configuration of the esophagus, looking for functional disorders (such as aspiration, dysphagia, achalasia, motility and reflux) EXAMINATION You may be asked to change into a gown, depending on the type of swallow being performed. A radiologist and radiographer will perform the procedure. The radiologist will advise you of the type of contrast selected for your procedure and direct you during the exam. You will be asked to stand, sit or lie in several different positions and to hold a small amount of fluid in your mouth before being asked to swallow while the imaging is performed .In some instances you may be asked to swallow barium coated marshmallows to assess the motility of a solid food bolus. The exam can be recorded as a digital or video fluoroscopy procedure. POST PROCEDURE It will take 1-2 days for the barium to pass through your system. To facilitate this, it is important, unless otherwise directed, to increase your fluids for the next 24-48hrs and to resume your normal diet.  This test typically takes about 30 minutes to  perform. __________________________________________________________________________________  Joann Bond have been scheduled for an Endoscopy. Please follow written instructions given to you at your visit today.  If you use inhalers (even only as needed), please bring them with you on the day of your procedure.  If you take any of the following medications, they will need to be adjusted prior to your procedure:   DO NOT TAKE 7 DAYS PRIOR TO TEST- Trulicity (dulaglutide) Ozempic, Wegovy (semaglutide) Mounjaro (tirzepatide) Bydureon Bcise (exanatide extended release)  DO NOT TAKE 1 DAY PRIOR TO YOUR TEST Rybelsus (semaglutide) Adlyxin (lixisenatide) Victoza (liraglutide) Byetta (exanatide) ___________________________________________________________________________  Please follow up sooner if symptoms increase or worsen  Due to recent changes in healthcare laws, you may see the results of your imaging and laboratory studies on MyChart before your provider has had a chance to review them.  We understand that in some cases there may be results that are confusing or concerning to you. Not all laboratory results come back in the same time frame and the provider may be waiting for multiple results in order to interpret others.  Please give us  48 hours in order for your provider to thoroughly review all the results before contacting the office for clarification of your results.   Thank you for trusting me with your gastrointestinal care!   Ellouise Console, PA-C _______________________________________________________  If your blood pressure at your visit was 140/90 or greater, please contact your primary care physician to follow up on this.  _______________________________________________________  If you are age 84 or older, your body mass index should be between 23-30. Your Body mass index is 27.63 kg/m. If this is out of the aforementioned range listed, please consider follow up  with your Primary  Care Provider.  If you are age 87 or younger, your body mass index should be between 19-25. Your Body mass index is 27.63 kg/m. If this is out of the aformentioned range listed, please consider follow up with your Primary Care Provider.   ________________________________________________________  The [facility] providers would like to encourage you to use MYCHART to communicate with providers for non-urgent requests or questions.  Due to long hold times on the telephone, sending your provider a message by [facility] may be a faster and more efficient way to get a response.  Please allow 48 business hours for a response.  Please remember that this is for non-urgent requests.  _______________________________________________________

## 2023-12-24 NOTE — Progress Notes (Signed)
 "     Joann Console, PA-C [address] [place], [place]  [address] Phone: [phone]   Gastroenterology Consultation  Referring Provider:     Joshua Debby CROME, MD Primary Care Physician:  Joshua Debby CROME, MD Primary Gastroenterologist:  Joann Console, PA-C / Dr. Gordy Starch  Reason for Consultation:     Dysphagia        HPI:   Discussed the use of AI scribe software for clinical note transcription with the patient, who gave verbal consent to proceed.  Joann Bond is a 58 year old female presents with intermittent dysphagia.  01/2015 screening colonoscopy done in [facility]:  Normal per patient.  10 year repeat due 01/2025.  Last EGD over 20 years ago: records not available.  No recent barium swallow test.  History of Present Illness Dysphagia - Intermittent episodes characterized by sensation of food occasionally getting stuck in the chest. - Episodes are sporadic, with periods of normal swallowing between events - Symptom has been present for several years. - Food eventually passes after getting stuck, but the experience is frightening - No vomiting, unintentional weight loss, abdominal pain, or hematochezia - EGD performed approximately 20 years ago did not reveal significant abnormalities  Gastroesophageal reflux symptoms -Occasional mild intermittent heartburn. - Occasional use of Pepcid for acid reflux - No regular use of acid-suppressing medications such as Prilosec.  Colon cancer screening - Colonoscopy approximately 9 years ago was normal except for diverticulosis - No polyps found on prior colonoscopy - She denies any lower GI symptoms such as rectal bleeding, weight loss, or abdominal pain. - No family history of colon cancer.  Upper gastrointestinal evaluation - Upper endoscopy performed about 20 years ago was reportedly normal  Medical history - Partial nephrectomy two years ago for kidney cancer - No evidence of recurrent cancer since nephrectomy -  Multiple back surgeries      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, neg margins   High cholesterol    Hypertension    Pneumonia    many years ago    Past Surgical History:  Procedure Laterality Date   EYE SURGERY     ROBOTIC ASSITED PARTIAL NEPHRECTOMY Right 07/25/2021   Procedure: XI ROBOTIC ASSITED  LAPAROSCOPIC PARTIAL NEPHRECTOMY;  Surgeon: Selma Donnice SAUNDERS, MD;  Location: [facility];  Service: Urology;  Laterality: Right;  ONLY NEEDS 240 MIN   SPINAL FUSION     Scoliosis/ age 73   SPINAL FUSION     spinal stenosis age 31   TONSILLECTOMY      Prior to Admission medications  Medication Sig Start Date End Date Taking? Authorizing Provider  empagliflozin  (JARDIANCE ) 10 MG TABS tablet Take 1 tablet (10 mg total) by mouth daily before breakfast. 10/07/23   Joshua Debby CROME, MD  gabapentin  (NEURONTIN ) 300 MG capsule Take 1 capsule (300 mg total) by mouth 3 (three) times daily. 08/18/23     gabapentin  (NEURONTIN ) 300 MG capsule Take 1 capsule (300 mg total) by mouth 3 (three) times daily. 11/25/23     nebivolol  (BYSTOLIC ) 10 MG tablet Take 1 tablet (10 mg total) by mouth daily. 10/13/23   Joshua Debby CROME, MD  oxyCODONE -acetaminophen  (PERCOCET/ROXICET) 5-325 MG tablet Take 1 tablet by mouth every 8 (eight) hours as needed for pain for up to 30  days. Max Daily Amount: 3 tablets. 08/20/23     oxyCODONE -acetaminophen  (PERCOCET/ROXICET) 5-325 MG tablet Take one tablet by mouth every 8 (eight) hours as needed for Pain for up to 30 days. Max Daily Amount: 3 tablets (10/19/23) 10/19/23     oxyCODONE -acetaminophen  (PERCOCET/ROXICET) 5-325 MG tablet Take 1 tablet by mouth every 8 (eight) hours as needed for pain. Max Daily Amount: 3 tablets. 09/17/23     oxyCODONE -acetaminophen  (PERCOCET/ROXICET) 5-325 MG tablet Take 1 tablet by mouth every 8 (eight) hours as  needed for pain. 11/19/23     oxyCODONE -acetaminophen  (PERCOCET/ROXICET) 5-325 MG tablet Take 1 tablet by mouth every 8 (eight) hours as needed for pain. 01/18/24     oxyCODONE -acetaminophen  (PERCOCET/ROXICET) 5-325 MG tablet Take 1 tablet by mouth every 8 (eight) hours as needed for pain. 12/19/23     oxyCODONE -acetaminophen  (PERCOCET/ROXICET) 5-325 MG tablet Take one tablet by mouth every 8 (eight) hours as needed for Pain for up to 30 days. 12/19/23 - 01/18/24 Max Daily Amount: 3 tablets 12/17/23     PROLIA  60 MG/ML SOSY injection Inject 60 mg into the skin every 6 (six) months. 06/19/22   Jegede, Olugbemiga E, MD  rosuvastatin  (CRESTOR ) 10 MG tablet Take 1 tablet (10 mg total) by mouth daily. 09/01/23   Joshua Debby CROME, MD  tizanidine  (ZANAFLEX ) 2 MG capsule Take 1 capsule (2 mg total) by mouth 3 (three) times daily. 08/18/23     tizanidine  (ZANAFLEX ) 2 MG capsule Take 1 capsule (2 mg total) by mouth 3 (three) times daily. 11/25/23     valsartan  (DIOVAN ) 80 MG tablet Take 1 tablet (80 mg total) by mouth 2 (two) times daily. 11/12/23   Joshua Debby CROME, MD    Family History  Problem Relation Age of Onset   Cancer Mother    Hypertension Mother    Breast cancer Mother    Kidney disease Father    Heart disease Father    Hypertension Brother      Social History[1]  Allergies as of 12/24/2023 - Review Complete 12/24/2023  Allergen Reaction Noted   Bactrim [sulfamethoxazole-trimethoprim] Hives 08/16/2019   Cefdinir   09/15/2021   Hydromorphone   04/12/2015    Review of Systems:    All systems reviewed and negative except where noted in HPI.   Physical Exam:  BP 100/64 (BP Location: Left Arm, Patient Position: Sitting)   Pulse 66   Ht 5' 1 (1.549 m)   Wt 146 lb 4 oz (66.3 kg)   BMI 27.63 kg/m  No LMP recorded. Patient is postmenopausal.  General:   Alert,  Well-developed, well-nourished, pleasant and cooperative in NAD Lungs:  Respirations even and unlabored.  Clear throughout to  auscultation.   No wheezes, crackles, or rhonchi. No acute distress. Heart:  Regular rate and rhythm; no murmurs, clicks, rubs, or gallops. Abdomen:  Normal bowel sounds.  No bruits.  Soft, and non-distended without masses, hepatosplenomegaly or hernias noted.  No Tenderness.  No guarding or rebound tenderness.    Neurologic:  Alert and oriented x3;  grossly normal neurologically. Psych:  Alert and cooperative. Normal mood and affect.   Imaging Studies: US  FNA BX THYROID  1ST LESION AFIRMA Result Date: 12/08/2023 INDICATION: Request for thyroid  biopsy of left mid thyroid  nodule (#3). EXAM: ULTRASOUND GUIDED FINE NEEDLE ASPIRATION OF INDETERMINATE THYROID  NODULE COMPARISON:  US  Thyroid  09/03/2023 MEDICATIONS: 2 mL 1% lidocaine  COMPLICATIONS: None immediate. TECHNIQUE: Informed written consent was obtained from the patient after a discussion of the risks, benefits and alternatives to treatment.  Questions regarding the procedure were encouraged and answered. A timeout was performed prior to the initiation of the procedure. Pre-procedural ultrasound scanning demonstrated unchanged size and appearance of the indeterminate nodule within the left mid thyroid  gland. The procedure was planned. The neck was prepped in the usual sterile fashion, and a sterile drape was applied covering the operative field. A timeout was performed prior to the initiation of the procedure. Local anesthesia was provided with 1% lidocaine . Under direct ultrasound guidance, 5 FNA biopsies were performed of the left mid thyroid  nodule with a 25 gauge needle. Multiple ultrasound images were saved for procedural documentation purposes. The samples were prepared and submitted to pathology. Limited post procedural scanning was negative for hematoma or additional complication. Dressings were placed. The patient tolerated the above procedures procedure well without immediate postprocedural complication. FINDINGS: Nodule reference number based on  prior diagnostic ultrasound: 3 Maximum size: 3.3 cm Location: Left; Mid ACR TI-RADS risk category: TR3 (3 points) Reason for biopsy: meets ACR TI-RADS criteria Ultrasound imaging confirms appropriate placement of the needles within the thyroid  nodule. IMPRESSION: Technically successful ultrasound guided fine needle aspiration of the left mid thyroid  nodule. Performed by: Kristi Davenport, NP under the direct supervision of Dr. Ester Sides Electronically Signed   By: Ester Sides M.D.   On: 12/08/2023 16:20    Labs: CBC    Component Value Date/Time   WBC 7.0 12/06/2023 1150   RBC 4.77 12/06/2023 1150   HGB 13.9 12/06/2023 1150   HCT 42.2 12/06/2023 1150   PLT 251.0 12/06/2023 1150   MCV 88.6 12/06/2023 1150    CMP     Component Value Date/Time   NA 141 12/06/2023 1150   K 4.8 12/06/2023 1150   CL 106 12/06/2023 1150   CO2 28 12/06/2023 1150   GLUCOSE 107 (H) 12/06/2023 1150   BUN 25 (H) 12/06/2023 1150   CREATININE 0.91 12/06/2023 1150   CALCIUM  10.0 12/06/2023 1150   PROT 8.1 09/01/2023 1103   ALBUMIN 4.8 09/01/2023 1103   AST 15 09/01/2023 1103   ALT 17 09/01/2023 1103   ALKPHOS 61 09/01/2023 1103   BILITOT 0.5 09/01/2023 1103   GFRNONAA 40 (L) 09/30/2022 2144    Assessment and Plan:   Shianna Bally is a 58 y.o. y/o female has been referred for: Assessment & Plan 1.  Solid food dysphagia Intermittent, chronic solid food dysphagia without alarming features.  Differential includes esophageal stricture, eosinophilic esophagitis, and esophageal dysmotility. - Schedule barium swallow with tablet. - Scheduling EGD with possible esophageal dilation.  Patient is not able to do procedure until February 2026, when transportation will be available. - I discussed risks of EGD with patient to include risk of bleeding, perforation, and risk of sedation.  Patient expressed understanding and agrees to proceed with EGD.  - Advised to avoid large pieces of meat, cut food into small  pieces, and chew thoroughly. - Recommend soft foods. - Provided guidance on risk of food impaction and instructed to seek emergency care if unable to swallow or if food becomes acutely lodged.  2.  Mild intermittent GERD - Continue OTC Pepcid, antacid, or Prilosec as needed. - GERD diet.  3.  Colon cancer screening - Requesting last colonoscopy done 01/2015 in [facility], reportedly normal. - 10-year repeat screening colonoscopy will be due 01/2025. - She does not have any current lower GI symptoms to warrant a diagnostic colonoscopy. - Colon cancer screening guidelines discussed.  Follow up based on test results and GI  symptoms.  Joann Console, PA-C      [1]  Social History Tobacco Use   Smoking status: Never   Smokeless tobacco: Never  Vaping Use   Vaping status: Never Used  Substance Use Topics   Alcohol use: Never   Drug use: Never   "

## 2023-12-31 ENCOUNTER — Ambulatory Visit (INDEPENDENT_AMBULATORY_CARE_PROVIDER_SITE_OTHER)

## 2023-12-31 ENCOUNTER — Encounter (INDEPENDENT_AMBULATORY_CARE_PROVIDER_SITE_OTHER): Payer: Self-pay

## 2023-12-31 VITALS — BP 134/84 | HR 64 | Temp 98.0°F | Wt 146.0 lb

## 2023-12-31 DIAGNOSIS — K219 Gastro-esophageal reflux disease without esophagitis: Secondary | ICD-10-CM

## 2023-12-31 DIAGNOSIS — E041 Nontoxic single thyroid nodule: Secondary | ICD-10-CM | POA: Diagnosis not present

## 2023-12-31 DIAGNOSIS — R1310 Dysphagia, unspecified: Secondary | ICD-10-CM | POA: Diagnosis not present

## 2023-12-31 NOTE — Progress Notes (Signed)
 ENT CONSULT:  Reason for Consult: left thyroid  nodule    HPI: Discussed the use of AI scribe software for clinical note transcription with the patient, who gave verbal consent to proceed.  History of Present Illness Joann Bond is a 58 year old female who presents for evaluation of a thyroid  nodule.  She has a thyroid  nodule measuring 3.3 cm on the left side TiRADs-3, identified on a recent ultrasound along with the right sided 2.2 cm cyst. All her thyroid  function tests have been normal in the past. The patient sometimes experiences trouble swallowing.  She has a history of kidney cancer diagnosed two years ago, which has necessitated regular chest x-rays. The thyroid  nodule was identified on a recent ultrasound, after her last chest x-ray.  She sometimes experiences trouble swallowing and has a recent onset of acid reflux symptoms, which she had not experienced before. She has appt with GI for dysphagia.   There is no family history of thyroid  cancer, although her mother had thyroid  nodules that were not cancerous. No prior neck radiation hx.   Interval hx Biopsy - benign follicular nodule bethesda II.   No new or worsening symptoms. This was her first ultrasound and biopsy. Has EGD scheduled.   Joann Bond is a 58 year old female with prior renal cell carcinoma resection who presents for evaluation of a newly identified nontoxic thyroid  nodule.  Thyroid  nodule - Nontoxic thyroid  nodule first identified in August 2025 as an incidental finding on imaging for kidney surveillance - Nodule measures approximately 3.3 cm and is described as benign in appearance - Cytology from the nodule was benign - Thyroid  function tests have consistently been within normal limits - No neck pain, discomfort, changes in voice, or difficulty breathing - No prior history of thyroid  nodules before this finding - Annual imaging for cancer surveillance in the prior year did not reveal any thyroid   abnormality  Gastroesophageal symptoms  - Symptoms attributed to gastroesophageal reflux disease - Scheduled for esophagogastroduodenoscopy for further evaluation  Renal cell carcinoma status - History of surgical resection for renal cell carcinoma two years ago - No recurrence or ongoing symptoms related to kidney cancer  Records Reviewed:  PCP visit Dr Joshua 09/01/23 She recently had a follow-up with her urologist after kidney cancer treatment, which showed no recurrence. During this visit, thyroid  nodules and cysts were discovered. She experiences pain in the thyroid  area, especially when swallowing or upon touch, but this pain has since subsided. She has not been on thyroid  medication and has never had a thyroid  ultrasound. Her thyroid  function tests have always been normal.   She has a long-standing issue with swallowing, where food sometimes feels like it gets stuck, although a previous swallowing test indicated no actual obstruction. She underwent a scope examination many years ago due to a bleeding ulcer, but no recent evaluations have been conducted.   Her blood pressure has been low, and her Norvasc  dose was reduced. Despite this, she has no weakness, dizziness, or lightheadedness. She also has no heartburn, indigestion, or abdominal pain.   Her kidney doctor recently confirmed good kidney function. She avoids taking Motrin  or aspirin due to her kidney condition, only using them occasionally.   Dr. Soldatovas note reviewed.    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, neg margins  High cholesterol    Hypertension    Pneumonia    many years ago    Past Surgical History:  Procedure Laterality Date   EYE SURGERY     ROBOTIC ASSITED PARTIAL NEPHRECTOMY Right 07/25/2021   Procedure: XI ROBOTIC ASSITED  LAPAROSCOPIC PARTIAL  NEPHRECTOMY;  Surgeon: Selma Donnice SAUNDERS, MD;  Location: [facility];  Service: Urology;  Laterality: Right;  ONLY NEEDS 240 MIN   SPINAL FUSION     Scoliosis/ age 43   SPINAL FUSION     spinal stenosis age 47   TONSILLECTOMY      Family History  Problem Relation Age of Onset   Cancer Mother    Hypertension Mother    Breast cancer Mother    Kidney disease Father    Heart disease Father    Hypertension Brother     Social History:  reports that she has never smoked. She has never used smokeless tobacco. She reports that she does not drink alcohol and does not use drugs.  Allergies:  Allergies  Allergen Reactions   Bactrim [Sulfamethoxazole-Trimethoprim] Hives   Cefdinir      nausea   Hydromorphone      ALG: Dilaudid   Comment:    Medications: I have reviewed the patient's current medications.  The PMH, PSH, Medications, Allergies, and SH were reviewed and updated.  ROS: Constitutional: Negative for fever, weight loss and weight gain. Cardiovascular: Negative for chest pain and dyspnea on exertion. Respiratory: Is not experiencing shortness of breath at rest. Gastrointestinal: Negative for nausea and vomiting. Neurological: Negative for headaches. Psychiatric: The patient is not nervous/anxious  Blood pressure 134/84, pulse 64, temperature 98 F (36.7 C), weight 146 lb (66.2 kg), SpO2 93%. Body mass index is 27.59 kg/m.  PHYSICAL EXAM:  Exam: General: Well-developed, well-nourished Communication and Voice: Clear pitch and clarity Respiratory effort: Equal inspiration and expiration without stridor Peripheral Vascular: Warm extremities with equal color/perfusion Eyes: No nystagmus with equal extraocular motion bilaterally Neuro/Psych/Balance: Patient oriented to person, place, and time; Appropriate mood and affect; Gait is intact with no imbalance; Cranial nerves I-XII are intact Head and Face Inspection: Normocephalic and atraumatic without mass or lesion Palpation: Facial  skeleton intact without bony stepoffs Salivary Glands: No mass or tenderness Facial Strength: Facial motility symmetric and full bilaterally ENT Pinna: External ear intact and fully developed External canal: Canal is patent with intact skin Tympanic Membrane: Clear and mobile External Nose: No scar or anatomic deformity Internal Nose: Septum is straight. No polyp, or purulence. Mucosal edema and erythema present.  Bilateral inferior turbinate hypertrophy.  Lips, Teeth, and gums: Mucosa and teeth intact and viable TMJ: No pain to palpation with full mobility Oral cavity/oropharynx: No erythema or exudate, no lesions present. Scarring tonsail fossas and tonsils absent Neck Neck and Trachea: Midline trachea without mass or lesion Thyroid : prominent and palpable nodule on the left thyroid  Lymphatics: No lymphadenopathy   Studies Reviewed: Thyroid  nodule 09/03/23 IMPRESSION: 1. Heterogeneous and multinodular thyroid  gland most consistent with multinodular goiter. 2. Nodule # 3 is a 3.3 cm TI-RADS category 3 nodule in the left mid gland meets criteria to consider fine-needle aspiration biopsy. Biopsy is recommended. 3. Large but sonographically low risk/benign colloid cyst in the right mid gland measures up to 2.8 cm.  Assessment/Plan: Encounter Diagnoses  Name Primary?   Thyroid  nodule Yes   Dysphagia, unspecified type    Gastroesophageal reflux disease, unspecified whether esophagitis present    Assessment & Plan Left thyroid  nodule 3.3 cm nodule requires biopsy  to assess cancer risk. No family history of thyroid  cancer. Normal thyroid  function tests. - benign, bethesda II  Contralateral thyroid  cyst Asymptomatic cyst without compressive symptoms. - monitor for now  Gastroesophageal reflux disease (GERD) Swallowing difficulty may be related to GERD due to upper esophageal sphincter tightening. -  Follow up with GI and EGD  Nontoxic single thyroid  nodule with benign  cytology and no thyroid  dysfunction. No surgical intervention needed. - Provided reassurance about benign nature and prevalence of thyroid  nodules. - Recommended repeat thyroid  ultrasound in one year. - Offered six-month ultrasound for earlier surveillance; she declined. - Planned to review EGD results at next follow-up.  -  Reflux Gourmet after meals - diet and lifestyle changes to minimize GERD - Refer to [employer] blog for dietary and lifestyle modifications/reflux cook book  Follow up in one year, will discuss repeat US .   Thank you for allowing me to participate in the care of this patient. Please do not hesitate to contact me with any questions or concerns.   Penne Croak 12/31/2023 1:58 PM Available on [employer] chat and [employer]  Department of Otolaryngology Contact Info: Otolaryngology [employer] including questions about appointments or questions: [phone] - If after normal business hours (Monday-Friday after 5PM or Weekends/Holidays), please call same number and follow prompts for Patient Access Line. There is a physician on call for urgent matters. For life threatening emergencies, please call 911

## 2024-01-06 ENCOUNTER — Encounter: Payer: Self-pay | Admitting: Internal Medicine

## 2024-01-07 ENCOUNTER — Other Ambulatory Visit (HOSPITAL_COMMUNITY): Payer: Self-pay

## 2024-01-07 ENCOUNTER — Other Ambulatory Visit: Payer: Self-pay

## 2024-01-07 ENCOUNTER — Encounter (HOSPITAL_COMMUNITY): Payer: Self-pay

## 2024-01-08 ENCOUNTER — Other Ambulatory Visit (HOSPITAL_COMMUNITY): Payer: Self-pay

## 2024-01-09 ENCOUNTER — Other Ambulatory Visit (HOSPITAL_COMMUNITY): Payer: Self-pay

## 2024-01-12 ENCOUNTER — Other Ambulatory Visit: Payer: Self-pay | Admitting: Internal Medicine

## 2024-01-12 ENCOUNTER — Other Ambulatory Visit (HOSPITAL_COMMUNITY): Payer: Self-pay

## 2024-01-12 ENCOUNTER — Other Ambulatory Visit: Payer: Self-pay

## 2024-01-12 ENCOUNTER — Ambulatory Visit (HOSPITAL_COMMUNITY)
Admission: RE | Admit: 2024-01-12 | Discharge: 2024-01-12 | Disposition: A | Discharge status: Home / Self Care | Source: Ambulatory Visit | Attending: Physician Assistant | Admitting: Physician Assistant

## 2024-01-12 DIAGNOSIS — I10 Essential (primary) hypertension: Secondary | ICD-10-CM

## 2024-01-12 DIAGNOSIS — R1310 Dysphagia, unspecified: Secondary | ICD-10-CM | POA: Diagnosis present

## 2024-01-12 MED ORDER — AMLODIPINE BESYLATE 5 MG PO TABS
5.0000 mg | ORAL_TABLET | Freq: Every day | ORAL | 0 refills | Status: AC
  Filled 2024-01-12 (×2): qty 90, 90d supply, fill #0

## 2024-01-13 ENCOUNTER — Other Ambulatory Visit: Payer: Self-pay

## 2024-01-17 ENCOUNTER — Ambulatory Visit: Payer: Self-pay | Admitting: Physician Assistant

## 2024-01-17 ENCOUNTER — Telehealth: Payer: Self-pay | Admitting: Physician Assistant

## 2024-01-17 NOTE — Telephone Encounter (Signed)
 Inbound call from patient stating she brought in her previous colonoscopy records, patient is scheduled for an EGD 02/15/24 and would like to know if records were reviewed so she can know if she can be scheduled for both procedures now.  Requesting a call back Please advise  Thank you

## 2024-01-17 NOTE — Telephone Encounter (Signed)
 Colon will not be due until 01/2025.

## 2024-01-17 NOTE — Telephone Encounter (Signed)
 Pt aware.

## 2024-01-18 ENCOUNTER — Other Ambulatory Visit (HOSPITAL_COMMUNITY): Payer: Self-pay

## 2024-01-18 ENCOUNTER — Other Ambulatory Visit: Payer: Self-pay

## 2024-01-18 ENCOUNTER — Other Ambulatory Visit: Payer: Self-pay | Admitting: Pharmacy Technician

## 2024-01-19 ENCOUNTER — Other Ambulatory Visit: Payer: Self-pay

## 2024-01-19 ENCOUNTER — Other Ambulatory Visit (HOSPITAL_COMMUNITY): Payer: Self-pay

## 2024-01-19 NOTE — Progress Notes (Signed)
 Specialty Pharmacy Refill Coordination Note  Joann Bond is a 59 y.o. female contacted today regarding refills of specialty medication(s) Denosumab  (PROLIA )   Patient requested Courier to Provider Office   Delivery date: 01/24/24   Verified address: [facility] [address] [name] [address]   Medication will be filled on: 01/21/24  Spoke with patient. Appointment not set at this time but patient states office will reach out to her to schedule. Office likely waiting until they have medication in hand to schedule. Patient should be due around 1.24.26.  Copay: $0.00

## 2024-01-20 ENCOUNTER — Telehealth: Payer: Self-pay | Admitting: Physician Assistant

## 2024-01-20 NOTE — Telephone Encounter (Signed)
 Colonoscopy records reviewed. Schedule colonoscopy.

## 2024-01-21 ENCOUNTER — Other Ambulatory Visit: Payer: Self-pay

## 2024-01-21 NOTE — Telephone Encounter (Signed)
 Spoke to Camie in [facility] to inform of adding a colonoscopy on to the EGD 02/15/24. Previsit appointment scheduled for Tue, 02/01/24 @ 8:30 AM.   MyChart message sent to the patient.

## 2024-01-21 NOTE — Telephone Encounter (Signed)
 Colonoscopy added to EGD on 02/15/24. See MyChart message dated 01/11/24.

## 2024-01-21 NOTE — Telephone Encounter (Signed)
 Ok to add colon to same slot (double in a 30 min slot) on 02/15/24. JMP

## 2024-01-26 ENCOUNTER — Telehealth: Payer: Self-pay

## 2024-01-27 ENCOUNTER — Telehealth: Payer: Self-pay

## 2024-01-27 NOTE — Telephone Encounter (Signed)
 Good afternoon Dr. Albertus,  Patient is scheduled for Endo/Colon on 02/15/2024 at 9:00 AM with you. She had a partial nephrectomy in 2023. What prep would you advise for this patient?   Thank you!

## 2024-01-27 NOTE — Telephone Encounter (Signed)
 Prolia  has been received in the office. I called the patient and was unable to reach her BUT I left a very detailed message about her calling the office and getting scheduled to have her injection done. She is due on ir after 01/29/2024 per the prolia  note.

## 2024-01-28 NOTE — Telephone Encounter (Signed)
 MoviPrep or Suflave Thanks [employer]

## 2024-01-28 NOTE — Telephone Encounter (Signed)
 Will do, Thank you Dr. Albertus!

## 2024-02-01 ENCOUNTER — Ambulatory Visit

## 2024-02-01 ENCOUNTER — Other Ambulatory Visit (HOSPITAL_COMMUNITY): Payer: Self-pay

## 2024-02-01 VITALS — Ht 61.0 in | Wt 145.0 lb

## 2024-02-01 DIAGNOSIS — R1310 Dysphagia, unspecified: Secondary | ICD-10-CM

## 2024-02-01 DIAGNOSIS — Z1211 Encounter for screening for malignant neoplasm of colon: Secondary | ICD-10-CM

## 2024-02-01 DIAGNOSIS — K219 Gastro-esophageal reflux disease without esophagitis: Secondary | ICD-10-CM

## 2024-02-01 MED ORDER — PEG-KCL-NACL-NASULF-NA ASC-C 100 G PO SOLR
1.0000 | ORAL | 0 refills | Status: AC
  Filled 2024-02-01: qty 1, 1d supply, fill #0

## 2024-02-01 NOTE — Progress Notes (Signed)

## 2024-02-02 ENCOUNTER — Other Ambulatory Visit (HOSPITAL_COMMUNITY): Payer: Self-pay

## 2024-02-02 ENCOUNTER — Encounter: Payer: Self-pay | Admitting: Internal Medicine

## 2024-02-02 ENCOUNTER — Ambulatory Visit (INDEPENDENT_AMBULATORY_CARE_PROVIDER_SITE_OTHER)

## 2024-02-02 DIAGNOSIS — M810 Age-related osteoporosis without current pathological fracture: Secondary | ICD-10-CM | POA: Diagnosis not present

## 2024-02-02 MED ORDER — DENOSUMAB 60 MG/ML ~~LOC~~ SOSY: 60.0000 mg | PREFILLED_SYRINGE | SUBCUTANEOUS | Status: AC

## 2024-02-02 MED ORDER — DENOSUMAB 60 MG/ML ~~LOC~~ SOSY
60.0000 mg | PREFILLED_SYRINGE | Freq: Once | SUBCUTANEOUS | Status: AC
  Administered 2024-02-02: 60 mg via SUBCUTANEOUS

## 2024-02-02 NOTE — Progress Notes (Signed)
After obtaining consent, and per orders of Dr. Ronnald Ramp, injection of Prolia given by Marrian Salvage. Patient instructed to report any adverse reaction to me immediately.

## 2024-02-08 ENCOUNTER — Other Ambulatory Visit: Payer: Self-pay

## 2024-02-08 ENCOUNTER — Other Ambulatory Visit: Payer: Self-pay | Admitting: Internal Medicine

## 2024-02-08 MED ORDER — VALSARTAN 80 MG PO TABS
80.0000 mg | ORAL_TABLET | Freq: Two times a day (BID) | ORAL | 0 refills | Status: AC
  Filled 2024-02-08: qty 180, 90d supply, fill #0

## 2024-02-09 ENCOUNTER — Other Ambulatory Visit (HOSPITAL_COMMUNITY): Payer: Self-pay

## 2024-02-09 MED ORDER — OXYCODONE-ACETAMINOPHEN 5-325 MG PO TABS: 1.0000 | ORAL_TABLET | Freq: Three times a day (TID) | ORAL | 0 refills | Status: AC | PRN

## 2024-02-10 ENCOUNTER — Other Ambulatory Visit: Payer: Self-pay

## 2024-02-15 ENCOUNTER — Encounter: Admitting: Internal Medicine

## 2024-03-06 ENCOUNTER — Ambulatory Visit: Admitting: Internal Medicine

## 2024-04-18 ENCOUNTER — Ambulatory Visit: Admitting: Nurse Practitioner
